# Patient Record
Sex: Male | Born: 1950
Health system: Southern US, Community
[De-identification: ages and names within clinical notes are randomized; demographics above are authoritative.]

## PROBLEM LIST (undated history)

## (undated) DIAGNOSIS — M109 Gout, unspecified: Secondary | ICD-10-CM

## (undated) DIAGNOSIS — I10 Essential (primary) hypertension: Secondary | ICD-10-CM

## (undated) DIAGNOSIS — Z8546 Personal history of malignant neoplasm of prostate: Secondary | ICD-10-CM

## (undated) DIAGNOSIS — R350 Frequency of micturition: Secondary | ICD-10-CM

## (undated) DIAGNOSIS — N433 Hydrocele, unspecified: Secondary | ICD-10-CM

## (undated) DIAGNOSIS — C801 Malignant (primary) neoplasm, unspecified: Secondary | ICD-10-CM

## (undated) DIAGNOSIS — M75101 Unspecified rotator cuff tear or rupture of right shoulder, not specified as traumatic: Secondary | ICD-10-CM

## (undated) DIAGNOSIS — Z9189 Other specified personal risk factors, not elsewhere classified: Secondary | ICD-10-CM

## (undated) HISTORY — PX: POLYPECTOMY: SHX149

## (undated) HISTORY — PX: COLONOSCOPY: SHX174

## (undated) HISTORY — DX: Malignant (primary) neoplasm, unspecified: C80.1

## (undated) HISTORY — DX: Essential (primary) hypertension: I10

---

## 2000-02-26 ENCOUNTER — Emergency Department (HOSPITAL_COMMUNITY): Admission: EM | Admit: 2000-02-26 | Discharge: 2000-02-26 | Payer: Self-pay | Admitting: Emergency Medicine

## 2004-08-09 ENCOUNTER — Emergency Department (HOSPITAL_COMMUNITY): Admission: EM | Admit: 2004-08-09 | Discharge: 2004-08-09 | Payer: Self-pay | Admitting: Emergency Medicine

## 2004-08-31 ENCOUNTER — Ambulatory Visit: Admission: RE | Admit: 2004-08-31 | Discharge: 2004-11-30 | Payer: Self-pay | Admitting: Radiation Oncology

## 2005-01-10 ENCOUNTER — Ambulatory Visit: Admission: RE | Admit: 2005-01-10 | Discharge: 2005-01-10 | Payer: Self-pay | Admitting: Radiation Oncology

## 2006-03-15 ENCOUNTER — Emergency Department (HOSPITAL_COMMUNITY): Admission: EM | Admit: 2006-03-15 | Discharge: 2006-03-15 | Payer: Self-pay | Admitting: Emergency Medicine

## 2012-01-29 ENCOUNTER — Other Ambulatory Visit: Payer: Self-pay | Admitting: Family Medicine

## 2012-04-16 ENCOUNTER — Other Ambulatory Visit: Payer: Self-pay | Admitting: Family Medicine

## 2012-04-16 NOTE — Telephone Encounter (Signed)
Need paper chart please.  

## 2012-04-17 NOTE — Telephone Encounter (Signed)
Chart pulled [id number]

## 2012-04-20 ENCOUNTER — Telehealth: Payer: Self-pay

## 2012-04-20 ENCOUNTER — Other Ambulatory Visit: Payer: Self-pay | Admitting: Family Medicine

## 2012-04-20 NOTE — Telephone Encounter (Signed)
Pt states that he is currently out of his maxide and zyloprim, he called the pharmacy two days ago to have them send over a request but has not heard anything as of yet. Best# [phone] Pharamacy: [facility] on [address]

## 2012-04-20 NOTE — Telephone Encounter (Deleted)
Pt states that he is currently out of his rx's for

## 2012-04-21 NOTE — Telephone Encounter (Signed)
Must have office visit. Not seen in over one year.

## 2012-04-21 NOTE — Telephone Encounter (Signed)
Chart pulled [id number]

## 2012-04-21 NOTE — Telephone Encounter (Signed)
Need chart

## 2012-04-21 NOTE — Telephone Encounter (Signed)
Patient needs office visit for these meds, I have called him and advised.

## 2012-05-03 ENCOUNTER — Other Ambulatory Visit: Payer: Self-pay | Admitting: Physician Assistant

## 2012-05-07 ENCOUNTER — Ambulatory Visit (INDEPENDENT_AMBULATORY_CARE_PROVIDER_SITE_OTHER): Payer: BC Managed Care – PPO | Admitting: Family Medicine

## 2012-05-07 VITALS — BP 112/78 | HR 69 | Temp 98.1°F | Resp 16 | Ht 68.5 in | Wt 214.4 lb

## 2012-05-07 DIAGNOSIS — M109 Gout, unspecified: Secondary | ICD-10-CM

## 2012-05-07 DIAGNOSIS — I10 Essential (primary) hypertension: Secondary | ICD-10-CM

## 2012-05-07 MED ORDER — ALLOPURINOL 300 MG PO TABS: 300.0000 mg | ORAL_TABLET | Freq: Every day | ORAL | Status: DC

## 2012-05-07 MED ORDER — TRIAMTERENE-HCTZ 75-50 MG PO TABS: 1.0000 | ORAL_TABLET | Freq: Every day | ORAL | Status: DC

## 2012-05-07 MED ORDER — COLCHICINE 0.6 MG PO TABS
0.6000 mg | ORAL_TABLET | Freq: Every day | ORAL | Status: DC
Start: 2012-05-07 — End: 2013-04-22

## 2012-05-07 NOTE — Progress Notes (Signed)
[facility]:  Office Visit  Chief Complaint:  Chief Complaint  Patient presents with  . Medication Refill    HPI: Jerry Chapman is a 61 y.o. male who complains of:  1. HTN-taking meds, compliant. Some mild cramps in ankle but no other SEs. Has lost 15 lbs. Does not take BP at home.  2. Gout-he takes 2 gout meds colcrys and allopurinol. States he was put on this and guranteed that his gout will never reoccur. It hasn't. He is able to eat and drink whatever he wants.   Past Medical History  Diagnosis Date  . Hypertension   . Gout   . Elevated PSA     s/p seed radiation   Past Surgical History  Procedure Date  . Prostate biopsy      + adencarcinoma   History   Social History  . Marital Status: Divorced    Spouse Name: N/A    Number of Children: N/A  . Years of Education: N/A   Social History Main Topics  . Smoking status: Never Smoker   . Smokeless tobacco: None  . Alcohol Use: Yes  . Drug Use: No  . Sexually Active: None   Other Topics Concern  . None   Social History Narrative  . None   No family history on file. No Known Allergies Prior to Admission medications   Medication Sig Start Date End Date Taking? Authorizing Provider  allopurinol (ZYLOPRIM) 300 MG tablet TAKE ONE TABLET BY MOUTH EVERY DAY 04/20/12  Yes Ryan M Dunn, PA-C  colchicine (COLCRYS) 0.6 MG tablet Take 1 tablet (0.6 mg total) by mouth daily. Needs office visit (second notice) 05/03/12  Yes Heather M Marte, PA-C  triamterene-hydrochlorothiazide (MAXZIDE) 75-50 MG per tablet TAKE ONE TABLET BY MOUTH EVERY DAY 04/20/12  Yes Ryan M Dunn, PA-C     ROS: The patient denies fevers, chills, night sweats, unintentional weight loss, chest pain, palpitations, wheezing, dyspnea on exertion, nausea, vomiting, abdominal pain, dysuria, hematuria, melena, numbness, weakness, or tingling.   All other systems have been reviewed and were otherwise negative with the exception of those  mentioned in the HPI and as above.    PHYSICAL EXAM: Filed Vitals:   05/07/12 1946  BP: 112/78  Pulse: 69  Temp: 98.1 F (36.7 C)  Resp: 16   Filed Vitals:   05/07/12 1946  Height: 5' 8.5" (1.74 m)  Weight: 214 lb 6.4 oz (97.251 kg)   Body mass index is 32.13 kg/(m^2).  General: Alert, no acute distress, pleasant HEENT:  Normocephalic, atraumatic, oropharynx patent. EOMI, fundoscopic exam nl Cardiovascular:  Regular rate and rhythm, no rubs murmurs or gallops.  No Carotid bruits, radial pulse intact. No pedal edema.  Respiratory: Clear to auscultation bilaterally.  No wheezes, rales, or rhonchi.  No cyanosis, no use of accessory musculature GI: No organomegaly, abdomen is soft and non-tender, positive bowel sounds.  No masses. Skin: No rashes. Neurologic: Facial musculature symmetric. Psychiatric: Patient is appropriate throughout our interaction. Lymphatic: No cervical lymphadenopathy Musculoskeletal: Gait intact.   LABS: No results found for this or any previous visit.   EKG/XRAY:   Primary read interpreted by Dr. Conley Rolls at [facility]   ASSESSMENT/PLAN: Encounter Diagnoses  Name Primary?  . HTN (hypertension) Yes  . Gout    1. Refill meds 2. Check BMP 3. Asked patient to return for annual exam and fasting blood work in 1-3 months. H/o prostate adenocarcinoma s/p seeding radiation.     LE, THAO  PHUONG, DO 05/07/2012 8:23 PM

## 2012-05-08 ENCOUNTER — Encounter: Payer: Self-pay | Admitting: Family Medicine

## 2012-05-08 LAB — BASIC METABOLIC PANEL WITH GFR
Calcium: 9.7 mg/dL (ref 8.4–10.5)
Potassium: 4.5 meq/L (ref 3.5–5.3)
Sodium: 139 meq/L (ref 135–145)

## 2012-05-08 LAB — BASIC METABOLIC PANEL
BUN: 19 mg/dL (ref 6–23)
CO2: 23 mEq/L (ref 19–32)
Chloride: 104 mEq/L (ref 96–112)
Creat: 1.63 mg/dL — ABNORMAL HIGH (ref 0.50–1.35)
Glucose, Bld: 87 mg/dL (ref 70–99)

## 2012-05-27 ENCOUNTER — Other Ambulatory Visit: Payer: Self-pay | Admitting: Physician Assistant

## 2012-05-28 ENCOUNTER — Encounter: Payer: Self-pay | Admitting: Family Medicine

## 2012-09-05 ENCOUNTER — Other Ambulatory Visit: Payer: Self-pay | Admitting: Physician Assistant

## 2012-11-24 ENCOUNTER — Other Ambulatory Visit: Payer: Self-pay | Admitting: Family Medicine

## 2013-01-17 ENCOUNTER — Other Ambulatory Visit: Payer: Self-pay | Admitting: Physician Assistant

## 2013-02-27 ENCOUNTER — Other Ambulatory Visit: Payer: Self-pay | Admitting: Physician Assistant

## 2013-04-22 ENCOUNTER — Ambulatory Visit (INDEPENDENT_AMBULATORY_CARE_PROVIDER_SITE_OTHER): Payer: BC Managed Care – PPO | Admitting: Emergency Medicine

## 2013-04-22 VITALS — BP 140/90 | HR 55 | Temp 98.0°F | Resp 16 | Ht 69.0 in | Wt 237.0 lb

## 2013-04-22 DIAGNOSIS — M109 Gout, unspecified: Secondary | ICD-10-CM

## 2013-04-22 DIAGNOSIS — I10 Essential (primary) hypertension: Secondary | ICD-10-CM

## 2013-04-22 MED ORDER — ALLOPURINOL 300 MG PO TABS: 300.0000 mg | ORAL_TABLET | Freq: Every day | ORAL | Status: DC

## 2013-04-22 MED ORDER — COLCHICINE 0.6 MG PO TABS: 0.6000 mg | ORAL_TABLET | Freq: Every day | ORAL | Status: DC

## 2013-04-22 MED ORDER — TRIAMTERENE-HCTZ 75-50 MG PO TABS: 1.0000 | ORAL_TABLET | Freq: Every day | ORAL | Status: DC

## 2013-04-22 NOTE — Progress Notes (Signed)
[facility] [address] [place] [address] [phone]  Date:  04/22/2013   Name:  Jerry Chapman   DOB:  03/07/1951   MRN:  [id number]  PCP:  No primary provider on file.    Chief Complaint: Medication Refill   History of Present Illness:  Jerry Chapman is a 62 y.o. very pleasant male patient who presents with the following:  History of gout and hypertension.  No documented recent lab work.  No colonoscopy.  Says up to date on immunizations.  No improvement with over the counter medications or other home remedies. Denies other complaint or health concern today.    There are no active problems to display for this patient.   Past Medical History  Diagnosis Date  . Hypertension   . Gout   . Elevated PSA     s/p seed radiation    Past Surgical History  Procedure Laterality Date  . Prostate biopsy       + adencarcinoma    History  Substance Use Topics  . Smoking status: Never Smoker   . Smokeless tobacco: Not on file  . Alcohol Use: Yes    No family history on file.  No Known Allergies  Medication list has been reviewed and updated.  Current Outpatient Prescriptions on File Prior to Visit  Medication Sig Dispense Refill  . allopurinol (ZYLOPRIM) 300 MG tablet Take 1 tablet (300 mg total) by mouth daily.  90 tablet  1  . colchicine (COLCRYS) 0.6 MG tablet Take 1 tablet (0.6 mg total) by mouth daily.  30 tablet  5  . colchicine (COLCRYS) 0.6 MG tablet Take 1 tablet (0.6 mg total) by mouth daily. Need office visit for additional refills.  30 tablet  0  . triamterene-hydrochlorothiazide (MAXZIDE) 75-50 MG per tablet TAKE ONE TABLET BY MOUTH ONCE DAILY -NEEDS  OFFICE  VISIT  30 tablet  0   No current facility-administered medications on file prior to visit.    Review of Systems:  As per HPI, otherwise negative.    Physical Examination: Filed Vitals:   04/22/13 2015  BP: 140/90  Pulse: 55  Temp: 98 F (36.7 C)  Resp: 16    Filed Vitals:   04/22/13 2015  Height: 5\' 9"  (1.753 m)  Weight: 237 lb (107.502 kg)   Body mass index is 34.98 kg/(m^2). Ideal Body Weight: Weight in (lb) to have BMI = 25: 168.9  GEN: obese, NAD, Non-toxic, A & O x 3 HEENT: Atraumatic, Normocephalic. Neck supple. No masses, No LAD. Ears and Nose: No external deformity. CV: RRR, No M/G/R. No JVD. No thrill. No extra heart sounds. PULM: CTA B, no wheezes, crackles, rhonchi. No retractions. No resp. distress. No accessory muscle use. ABD: S, NT, ND, +BS. No rebound. No HSM. EXTR: No c/c/e NEURO Normal gait.  PSYCH: Normally interactive. Conversant. Not depressed or anxious appearing.  Calm demeanor.    Assessment and Plan: Hypertension Gout Overdue for labs and physical  Needs colonoscopy Wants to schedule with Dr Perrin Maltese   Signed,  Phillips Odor, MD

## 2013-04-28 ENCOUNTER — Telehealth: Payer: Self-pay

## 2013-04-28 DIAGNOSIS — M109 Gout, unspecified: Secondary | ICD-10-CM

## 2013-04-28 MED ORDER — COLCHICINE 0.6 MG PO TABS: 0.6000 mg | ORAL_TABLET | Freq: Every day | ORAL | Status: DC

## 2013-04-28 MED ORDER — TRIAMTERENE-HCTZ 75-50 MG PO TABS: 1.0000 | ORAL_TABLET | Freq: Every day | ORAL | Status: DC

## 2013-04-28 MED ORDER — ALLOPURINOL 300 MG PO TABS: 300.0000 mg | ORAL_TABLET | Freq: Every day | ORAL | Status: DC

## 2013-04-28 NOTE — Telephone Encounter (Signed)
Sent!

## 2013-04-28 NOTE — Telephone Encounter (Signed)
Pt would like the maxzide and allopurinol sent to the [facility] on [address]. The [facility] was too expensive. Call at [phone]

## 2013-05-09 ENCOUNTER — Ambulatory Visit: Payer: BC Managed Care – PPO

## 2013-05-09 ENCOUNTER — Ambulatory Visit (INDEPENDENT_AMBULATORY_CARE_PROVIDER_SITE_OTHER): Payer: BC Managed Care – PPO | Admitting: Internal Medicine

## 2013-05-09 VITALS — BP 124/80 | HR 63 | Temp 98.0°F | Resp 16 | Ht 69.0 in | Wt 230.0 lb

## 2013-05-09 DIAGNOSIS — M545 Low back pain: Secondary | ICD-10-CM

## 2013-05-09 DIAGNOSIS — M109 Gout, unspecified: Secondary | ICD-10-CM

## 2013-05-09 DIAGNOSIS — B360 Pityriasis versicolor: Secondary | ICD-10-CM

## 2013-05-09 DIAGNOSIS — N390 Urinary tract infection, site not specified: Secondary | ICD-10-CM

## 2013-05-09 DIAGNOSIS — Z8546 Personal history of malignant neoplasm of prostate: Secondary | ICD-10-CM

## 2013-05-09 DIAGNOSIS — K0889 Other specified disorders of teeth and supporting structures: Secondary | ICD-10-CM

## 2013-05-09 DIAGNOSIS — K089 Disorder of teeth and supporting structures, unspecified: Secondary | ICD-10-CM

## 2013-05-09 DIAGNOSIS — I10 Essential (primary) hypertension: Secondary | ICD-10-CM

## 2013-05-09 DIAGNOSIS — N289 Disorder of kidney and ureter, unspecified: Secondary | ICD-10-CM

## 2013-05-09 LAB — POCT UA - MICROSCOPIC ONLY
Crystals, Ur, HPF, POC: NEGATIVE
Mucus, UA: POSITIVE

## 2013-05-09 LAB — POCT URINALYSIS DIPSTICK
Bilirubin, UA: NEGATIVE
Blood, UA: NEGATIVE
Nitrite, UA: NEGATIVE
Protein, UA: NEGATIVE
pH, UA: 6.5

## 2013-05-09 MED ORDER — HYDROCODONE-ACETAMINOPHEN 5-325 MG PO TABS: 1.0000 | ORAL_TABLET | Freq: Four times a day (QID) | ORAL | Status: DC | PRN

## 2013-05-09 MED ORDER — KETOCONAZOLE 200 MG PO TABS: 200.0000 mg | ORAL_TABLET | Freq: Every day | ORAL | Status: DC

## 2013-05-09 MED ORDER — AMOXICILLIN 500 MG PO CAPS: 1000.0000 mg | ORAL_CAPSULE | Freq: Two times a day (BID) | ORAL | Status: DC

## 2013-05-09 MED ORDER — METHOCARBAMOL 750 MG PO TABS: 750.0000 mg | ORAL_TABLET | Freq: Four times a day (QID) | ORAL | Status: DC

## 2013-05-09 NOTE — Progress Notes (Signed)
  Subjective:    Patient ID: Jerry Chapman, male    DOB: 11-26-1950, 62 y.o.   MRN: [id number]  HPI Pt presents with back pain, lower. He has not had any specific injury. The pain started on Monday. He is a contractor/construction. The pain is starting to radiate down his right leg. Pain doesn't keep him awake at night. The pain is on the sides of his back.  He also has tooth pain, ?abscess Also notes rash on shoulder--fungus Hx of Prostate CA  Review of Systems     Objective:   Physical Exam        Assessment & Plan:

## 2013-05-09 NOTE — Progress Notes (Signed)
  Subjective:    Patient ID: Jerry Chapman, male    DOB: [birth year], 62 y.o.   MRN: [id number]  HPI LBP for 1-2 weeks after bending over, minimal radiation, no numbess, weakness or incontinence. Bad tooth upper right molar Rash recurrent back and shoulders.Dx tinea and treated orally and topically   Review of Systems Prostate cancer Gout    Objective:   Physical Exam  Vitals reviewed. Constitutional: He is oriented to person, place, and time. He appears well-developed and well-nourished. No distress.  HENT:  Mouth/Throat: Uvula is midline, oropharynx is clear and moist and mucous membranes are normal. Abnormal dentition. Dental abscesses and dental caries present.    Eyes: EOM are normal.  Cardiovascular: Normal rate.   Pulmonary/Chest: Effort normal.  Musculoskeletal: He exhibits tenderness.  Neurological: He is alert and oriented to person, place, and time. No cranial nerve deficit. He exhibits normal muscle tone. Coordination normal.  Skin: Rash noted. Rash is macular. Rash is not pustular and not vesicular. There is erythema.     Psychiatric: He has a normal mood and affect.   Results for orders placed in visit on 05/09/13  POCT URINALYSIS DIPSTICK      Result Value Range   Color, UA yellow     Clarity, UA clear     Glucose, UA neg     Bilirubin, UA neg     Ketones, UA neg     Spec Grav, UA 1.020     Blood, UA neg     pH, UA 6.5     Protein, UA neg     Urobilinogen, UA 0.2     Nitrite, UA neg     Leukocytes, UA small (1+)    POCT UA - MICROSCOPIC ONLY      Result Value Range   WBC, Ur, HPF, POC 4-5     RBC, urine, microscopic 2-3     Bacteria, U Microscopic 1+     Mucus, UA pos     Epithelial cells, urine per micros 1-6     Crystals, Ur, HPF, POC neg     Casts, Ur, LPF, POC hyaline     Yeast, UA neg     CS urine  [facility] reading (PRIMARY) by  Dr Lache Dagher LS spine L5-S1 mild DDD/spondylosis       Assessment & Plan:  Abscess tooth LBP/elevated  creatinine/Gout Tinea corporus back and trunk/Ketoconazole for 7days 200mg  Amoxil/See dentist Back manual/Robaxin/Possible UTI

## 2013-05-09 NOTE — Patient Instructions (Addendum)
Body Ringworm Ringworm (tinea corporis) is a fungal infection of the skin on the body. This infection is not caused by worms, but is actually caused by a fungus. Fungus normally lives on the top of your skin and can be useful. However, in the case of ringworms, the fungus grows out of control and causes a skin infection. It can involve any area of skin on the body and can spread easily from one person to another (contagious). Ringworm is a common problem for children, but it can affect adults as well. Ringworm is also often found in athletes, especially wrestlers who share equipment and mats.  CAUSES  Ringworm of the body is caused by a fungus called dermatophyte. It can spread by:  Touchingother people who are infected.  Touchinginfected pets.  Touching or sharingobjects that have been in contact with the infected person or pet (hats, combs, towels, clothing, sports equipment). SYMPTOMS   Itchy, raised red spots and bumps on the skin.  Ring-shaped rash.  Redness near the border of the rash with a clear center.  Dry and scaly skin on or around the rash. Not every person develops a ring-shaped rash. Some develop only the red, scaly patches. DIAGNOSIS  Most often, ringworm can be diagnosed by performing a skin exam. Your caregiver may choose to take a skin scraping from the affected area. The sample will be examined under the microscope to see if the fungus is present.  TREATMENT  Body ringworm may be treated with a topical antifungal cream or ointment. Sometimes, an antifungal shampoo that can be used on your body is prescribed. You may be prescribed antifungal medicines to take by mouth if your ringworm is severe, keeps coming back, or lasts a long time.  HOME CARE INSTRUCTIONS   Only take over-the-counter or prescription medicines as directed by your caregiver.  Wash the infected area and dry it completely before applying yourcream or ointment.  When using antifungal shampoo to  treat the ringworm, leave the shampoo on the body for 3 5 minutes before rinsing.   Wear loose clothing to stop clothes from rubbing and irritating the rash.  Wash or change your bed sheets every night while you have the rash.  Have your pet treated by your veterinarian if it has the same infection. To prevent ringworm:   Practice good hygiene.  Wear sandals or shoes in public places and showers.  Do not share personal items with others.  Avoid touching red patches of skin on other people.  Avoid touching pets that have bald spots or wash your hands after doing so. SEEK MEDICAL CARE IF:   Your rash continues to spread after 7 days of treatment.  Your rash is not gone in 4 weeks.  The area around your rash becomes red, warm, tender, and swollen. Document Released: 08/09/2000 Document Revised: 05/06/2012 Document Reviewed: 02/24/2012 [facility] Patient Information 2014 [place], [place]. Tinea Versicolor Tinea versicolor is a common yeast infection of the skin. This condition becomes known when the yeast on our skin starts to overgrow (yeast is a normal inhabitant on our skin). This condition is noticed as white or light brown patches on brown skin, and is more evident in the summer on tanned skin. These areas are slightly scaly if scratched. The light patches from the yeast become evident when the yeast creates "holes in your suntan". This is most often noticed in the summer. The patches are usually located on the chest, back, pubis, neck and body folds. However, it may  occur on any area of body. Mild itching and inflammation (redness or soreness) may be present. DIAGNOSIS  The diagnosisof this is made clinically (by looking). Cultures from samples are usually not needed. Examination under the microscope may help. However, yeast is normally found on skin. The diagnosis still remains clinical. Examination under Wood's Ultraviolet Light can determine the extent of the infection. TREATMENT   This common infection is usually only of cosmetic (only a concern to your appearance). It is easily treated with dandruff shampoo used during showers or bathing. Vigorous scrubbing will eliminate the yeast over several days time. The light areas in your skin may remain for weeks or months after the infection is cured unless your skin is exposed to sunlight. The lighter or darker spots caused by the fungus that remain after complete treatment are not a sign of treatment failure; it will take a long time to resolve. Your caregiver may recommend a number of commercial preparations or medication by mouth if home care is not working. Recurrence is common and preventative medication may be necessary. This skin condition is not highly contagious. Special care is not needed to protect close friends and family members. Normal hygiene is usually enough. Follow up is required only if you develop complications (such as a secondary infection from scratching), if recommended by your caregiver, or if no relief is obtained from the preparations used. Document Released: 08/09/2000 Document Revised: 11/04/2011 Document Reviewed: 09/21/2008 [facility] Patient Information 2014 [place], [place].

## 2013-05-10 LAB — COMPREHENSIVE METABOLIC PANEL
Alkaline Phosphatase: 52 U/L (ref 39–117)
BUN: 23 mg/dL (ref 6–23)
Glucose, Bld: 98 mg/dL (ref 70–99)
Sodium: 136 mEq/L (ref 135–145)
Total Bilirubin: 0.5 mg/dL (ref 0.3–1.2)

## 2013-05-11 LAB — TSH: TSH: 1.595 u[IU]/mL (ref 0.350–4.500)

## 2013-05-12 LAB — URINE CULTURE: Colony Count: >=100000

## 2013-05-16 ENCOUNTER — Ambulatory Visit (INDEPENDENT_AMBULATORY_CARE_PROVIDER_SITE_OTHER): Payer: BC Managed Care – PPO | Admitting: Internal Medicine

## 2013-05-16 VITALS — BP 110/70 | HR 63 | Temp 98.6°F | Resp 16 | Ht 69.0 in | Wt 226.0 lb

## 2013-05-16 DIAGNOSIS — M545 Low back pain: Secondary | ICD-10-CM

## 2013-05-16 DIAGNOSIS — R7989 Other specified abnormal findings of blood chemistry: Secondary | ICD-10-CM

## 2013-05-16 DIAGNOSIS — C61 Malignant neoplasm of prostate: Secondary | ICD-10-CM

## 2013-05-16 DIAGNOSIS — E790 Hyperuricemia without signs of inflammatory arthritis and tophaceous disease: Secondary | ICD-10-CM

## 2013-05-16 DIAGNOSIS — K921 Melena: Secondary | ICD-10-CM

## 2013-05-16 DIAGNOSIS — N390 Urinary tract infection, site not specified: Secondary | ICD-10-CM

## 2013-05-16 DIAGNOSIS — M109 Gout, unspecified: Secondary | ICD-10-CM

## 2013-05-16 LAB — POCT URINALYSIS DIPSTICK
Ketones, UA: NEGATIVE
Leukocytes, UA: NEGATIVE
Nitrite, UA: NEGATIVE
Protein, UA: NEGATIVE
Urobilinogen, UA: 0.2
pH, UA: 5.5

## 2013-05-16 LAB — POCT UA - MICROSCOPIC ONLY
Casts, Ur, LPF, POC: NEGATIVE
Yeast, UA: NEGATIVE

## 2013-05-16 MED ORDER — CEFTRIAXONE SODIUM 1 G IJ SOLR
1.0000 g | Freq: Once | INTRAMUSCULAR | Status: AC
  Administered 2013-05-16: 1 g via INTRAMUSCULAR

## 2013-05-16 NOTE — Progress Notes (Signed)
Subjective:    Patient ID: Jerry Chapman, male    DOB: April 06, 1951, 62 y.o.   MRN: [id number]  HPI Urine culture positive for kidney stone, uric acid very high at 12.6, back pain , elevated creatinine, All this suggests uric acid nephropayhy with in infection.   Review of Systems     Objective:   Physical Exam  Results for orders placed in visit on 05/09/13  URINE CULTURE      Result Value Range   Culture PROTEUS MIRABILIS     Colony Count >=100,000 COLONIES/ML     Organism ID, Bacteria PROTEUS MIRABILIS    PSA      Result Value Range   PSA 0.79  <=4.00 ng/mL  COMPREHENSIVE METABOLIC PANEL      Result Value Range   Sodium 136  135 - 145 mEq/L   Potassium 4.7  3.5 - 5.3 mEq/L   Chloride 98  96 - 112 mEq/L   CO2 29  19 - 32 mEq/L   Glucose, Bld 98  70 - 99 mg/dL   BUN 23  6 - 23 mg/dL   Creat 4.09 (*) 8.11 - 1.35 mg/dL   Total Bilirubin 0.5  0.3 - 1.2 mg/dL   Alkaline Phosphatase 52  39 - 117 U/L   AST 40 (*) 0 - 37 U/L   ALT 36  0 - 53 U/L   Total Protein 8.1  6.0 - 8.3 g/dL   Albumin 4.5  3.5 - 5.2 g/dL   Calcium 91.4  8.4 - 78.2 mg/dL  URIC ACID      Result Value Range   Uric Acid, Serum 12.6 (*) 4.0 - 7.8 mg/dL  TSH      Result Value Range   TSH 1.595  0.350 - 4.500 uIU/mL  POCT URINALYSIS DIPSTICK      Result Value Range   Color, UA yellow     Clarity, UA clear     Glucose, UA neg     Bilirubin, UA neg     Ketones, UA neg     Spec Grav, UA 1.020     Blood, UA neg     pH, UA 6.5     Protein, UA neg     Urobilinogen, UA 0.2     Nitrite, UA neg     Leukocytes, UA small (1+)    POCT UA - MICROSCOPIC ONLY      Result Value Range   WBC, Ur, HPF, POC 4-5     RBC, urine, microscopic 2-3     Bacteria, U Microscopic 1+     Mucus, UA pos     Epithelial cells, urine per micros 1-6     Crystals, Ur, HPF, POC neg     Casts, Ur, LPF, POC hyaline     Yeast, UA neg     Results for orders placed in visit on 05/16/13  POCT URINALYSIS DIPSTICK      Result  Value Range   Color, UA yellow     Clarity, UA clear     Glucose, UA neg     Bilirubin, UA neg     Ketones, UA neg     Spec Grav, UA 1.020     Blood, UA neg     pH, UA 5.5     Protein, UA neg     Urobilinogen, UA 0.2     Nitrite, UA neg     Leukocytes, UA Negative    POCT UA - MICROSCOPIC ONLY  Result Value Range   WBC, Ur, HPF, POC 0-1     RBC, urine, microscopic neg     Bacteria, U Microscopic neg     Mucus, UA trace     Epithelial cells, urine per micros 0-1     Crystals, Ur, HPF, POC neg     Casts, Ur, LPF, POC neg     Yeast, UA neg    IFOBT (OCCULT BLOOD)      Result Value Range   IFOBT Positive           Assessment & Plan:  UTI/Elevated uric acid Refer to urologyRefer to colonoscopy blood in stool Rocephin 1 g Back exercises

## 2013-05-16 NOTE — Patient Instructions (Addendum)
Uric Acid Nephropathy Uric acid nephropathy is a condition of kidney damage that occurs when there is too much uric acid in the body. When the uric acid is too high, crystals can form in the tissues of the body. When this happens in the kidneys, it is called gouty or uric acid nephropathy.  Uric acid comes from the breakdown of purines in your diet. If too much uric acid is made, or your body does not efficiently clear uric acid from your body, then this can lead to too much uric acid in your blood. The whole system can also get very overworked. In those cases, kidney damage occurs; but the disease does not always cause high uric acid levels in the blood. The disease does not always cause high uric acid levels in the blood. CAUSES  Most cases of uric acid nephropathy happen during the treatment of cancer. This is because of the increase in uric acid production during chemotherapy.  Uric Acid Nephropathy happens frequently in people with gout. Some more common causes of high uric acid in the body include:  Overproduction of urate  Breakdown of blood.  Lymphomas and leukemias.  Polycythemia vera.  Psoriasis (severe).  Paget's disease.  Muscle breakdown.  Exercise.  Alcohol.  Obesity.  Purine-rich diet.  Decreased excretion of uric acid  Primary idiopathic hyperuricemia.  Kidney disease.  Diabetes insipidus.  High blood pressure.  Acidosis.  Down syndrome.  Starvation.  Sarcoidosis.  Lead intake.  Hyperparathyroidism.  Hypothyroidism.  Toxemia of Pregnancy.  Drug ingestion.  Aspirin (less than 2 g per day).  Water pills (diuretics).  Alcohol.  Sinemet.  Nicotinic acid.  Combined mechanism.  Glucose-6-phosphate dehydrogenase deficiency.  Alcohol. SYMPTOMS When uric acid nephropathy leads to sudden kidney failure, symptoms can include:  Nausea and vomiting.  Lethargy and seizures.  Complete kidney failure with the inability to pass your  water or urinating less. If stones form in the kidneys or ureters, some of the problems may include:  Flank pain.  Blood in the urine.  Urinary frequency.  Uncomfortable or painful urination. DIAGNOSIS  The diagnosis is based on finding uric acid crystals in joints, tissues or body fluids.  Blood work is done to check on the uric acid levels but is not always abnormal. Kidney function tests and other blood work is also done.  A urinalysis is usually performed and will show if uric acid crystals are found in the urine. The urine sample is usually normal. TREATMENT  The treatment is aimed at stopping the immediate problems and preventing them from coming back. It is also for the prevention of complications which come from longstanding gout or high uric acid in the tissues.  This is often done with medications.  If the start of kidney failure has been sudden and more severe, dialysis may be used.  Your caregiver may have you take a special diet which is low in purines. Just cutting out animal meats is a great help to cut down on your purine intake.  Keep up a good fluid intake. You should drink enough water to put out 2 to 3 quarts or liters of urine per day. PREVENTION  Prevention is done by using medications which decrease uric acid production or with medications to increase the rate of removal by the kidneys.  Obesity, high purine diets, alcohol, certain medications and too much exercise can elevate the uric acid. SEEK IMMEDIATE MEDICAL CARE IF:  You have been put on allopurinol and develop a rash. This  is a medical emergency.  You develop flank pain, urinate less or stop urinating.  Your urine becomes smoky or bloody colored.  You develop nausea or vomiting.  You develop lethargy or seizures. Document Released: 06/09/2007 Document Revised: 11/04/2011 Document Reviewed: 06/09/2007 [facility] Patient Information 2014 [place], [place]. Chronic Kidney Disease Chronic kidney  disease occurs when the kidneys are damaged over a long period. The kidneys are two organs that lie on either side of the spine between the middle of the back and the front of the abdomen. The kidneys:   Remove wastes and extra water from the blood.   Produce important hormones. These help keep bones strong, regulate blood pressure, and help create red blood cells.   Balance the fluids and chemicals in the blood and tissues. A small amount of kidney damage may not cause problems, but a large amount of damage may make it difficult or impossible for the kidneys to work the way they should. If steps are not taken to slow down the kidney damage or stop it from getting worse, the kidneys may stop working permanently. Most of the time, chronic kidney disease does not go away. However, it can often be controlled, and those with the disease can usually live normal lives. CAUSES  The most common causes of chronic kidney disease are diabetes and high blood pressure (hypertension). Chronic kidney disease may also be caused by:   Diseases that cause kidneys' filters to become inflamed.   Diseases that affect the immune system.   Genetic diseases.   Medicines that damage the kidneys, such as anti-inflammatory medicines.  Poisoning or exposure to toxic substances.   A reoccurring kidney or urinary infection.   A problem with urine flow. This may be caused by:   Cancer.   Kidney stones.   An enlarged prostate in males. SYMPTOMS  Because the kidney damage in chronic kidney disease occurs slowly, symptoms develop slowly and may not be obvious until the kidney damage becomes severe. A person may have a kidney disease for years without showing any symptoms. Symptoms can include:   Swelling (edema) of the legs, ankles, or feet.   Tiredness (lethargy).   Nausea or vomiting.   Confusion.   Problems with urination, such as:   Decreased urine production.   Frequent urination,  especially at night.   Frequent accidents in children who are potty trained.   Muscle twitches and cramps.   Shortness of breath.  Weakness.   Persistent itchiness.   Loss of appetite.  Metallic taste in the mouth.  Trouble sleeping.  Slowed development in children.  Short stature in children. DIAGNOSIS  Chronic kidney disease may be detected and diagnosed by tests, including blood, urine, imaging, or kidney biopsy tests.  TREATMENT  Most chronic kidney diseases cannot be cured. Treatment usually involves relieving symptoms and preventing or slowing the progression of the disease. Treatment may include:   A special diet. You may need to avoid alcohol and foods thatare salty and high in potassium.   Medicines. These may:   Lower blood pressure.   Relieve anemia.   Relieve swelling.   Protect the bones. HOME CARE INSTRUCTIONS   Follow your prescribed diet.   Only take over-the-counter or prescription medicines as directed by your caregiver.  Do not take any new medicines (prescription, over-the-counter, or nutritional supplements) unless approved by your caregiver. Many medicines can worsen your kidney damage or need to have the dose adjusted.   Quit smoking if you are a smoker.  Talk to your caregiver about a smoking cessation program.   Keep all follow-up appointments as directed by your caregiver. SEEK IMMEDIATE MEDICAL CARE IF:  Your symptoms get worse or you develop new symptoms.   You develop symptoms of end-stage kidney disease. These include:   Headaches.   Abnormally dark or light skin.   Numbness in the hands or feet.   Easy bruising.   Frequent hiccups.   Menstruation stops.   You have a fever.   You have decreased urine production.   You havepain or bleeding when urinating. MAKE SURE YOU:  Understand these instructions.  Will watch your condition.  Will get help right away if you are not doing well or get  worse. FOR MORE INFORMATION  American Association of Kidney Patients: ResidentialShow.is National Kidney Foundation: www.kidney.org American Kidney Fund: FightingMatch.com.ee [facility]: www.lifeoptions.org and www.kidneyschool.org Document Released: 05/21/2008 Document Revised: 07/29/2012 Document Reviewed: 04/10/2012 [facility] Patient Information 2014 [place], [place].

## 2013-05-16 NOTE — Progress Notes (Signed)
  Subjective:    Patient ID: Jerry Chapman, male    DOB: [birth year], 62 y.o.   MRN: [id number]  HPI Pt presents to clinic today for a 1 week follow up on LBP. Hydrocodone provides relief but pt stated he "tries not to take it". Only takes 1/2 tablet once daily for fear he may become dependent on it. No fever, night sweats.    Review of Systems     Objective:   Physical Exam        Assessment & Plan:

## 2013-05-28 ENCOUNTER — Other Ambulatory Visit: Payer: Self-pay | Admitting: Urology

## 2013-06-04 ENCOUNTER — Encounter: Payer: Self-pay | Admitting: Internal Medicine

## 2013-06-22 ENCOUNTER — Encounter (HOSPITAL_BASED_OUTPATIENT_CLINIC_OR_DEPARTMENT_OTHER): Payer: Self-pay | Admitting: *Deleted

## 2013-06-22 NOTE — Progress Notes (Addendum)
NPO AFTER MN. ARRIVE AT 0600. NEEDS ISTAT AND EKG.  SPOKE W/ PT ABOUT CHANGE OF DATE AND TIME OF PROCEDURE. PT TO ARRIVE AT 0645 ON 07-05-2013.  SAMES INSTRUCTIONS AS ABOVE.

## 2013-06-22 NOTE — Progress Notes (Signed)
06/22/13 1309  OBSTRUCTIVE SLEEP APNEA  Have you ever been diagnosed with sleep apnea through a sleep study? No  Do you snore loudly (loud enough to be heard through closed doors)?  1  Do you often feel tired, fatigued, or sleepy during the daytime? 0  Has anyone observed you stop breathing during your sleep? 0  Do you have, or are you being treated for high blood pressure? 1  BMI more than 35 kg/m2? 0  Age over 63 years old? 1  Neck circumference greater than 40 cm/18 inches? 0  Gender: 1  Obstructive Sleep Apnea Score 4  Score 4 or greater  Results sent to PCP

## 2013-06-30 ENCOUNTER — Encounter: Payer: Self-pay | Admitting: Internal Medicine

## 2013-06-30 ENCOUNTER — Ambulatory Visit (AMBULATORY_SURGERY_CENTER): Payer: BC Managed Care – PPO

## 2013-06-30 VITALS — Ht 69.0 in | Wt 216.0 lb

## 2013-06-30 DIAGNOSIS — Z1211 Encounter for screening for malignant neoplasm of colon: Secondary | ICD-10-CM

## 2013-06-30 MED ORDER — MOVIPREP 100 G PO SOLR: 1.0000 | Freq: Once | ORAL | Status: DC

## 2013-07-04 NOTE — H&P (Signed)
Jerry Chapman a 62 year old male with a history of prostate cancer treated with radiation.   History of Present Illness           Adenocarcinoma of the prostate: He was found to have a PSA of 10.5 in 12/05.  DRE was found to be negative.   TRUS/BX 08/09/04: Prostate volume measured 34 cc. Pathology: Gleason 3+3 = 6 in 5% of the specimen on the right side and 3+4 = 7 in 10% of the specimen from the left side. Treatment: IMRT completed in 4/06.    Interval history: His PSA fell to 0.44 in 3/08.  I recommended follow-up in 6 months however he failed to return.   He was found recently to have a PSA in 9/14 of 0.79.  A urine culture was positive for Proteus at that time in his serum uric acid level was elevated to 12.6 (4-7 0.8) with a creatinine of 1.62.  He was diagnosed with uric acid nephropathy. He denies any change in his voiding pattern. He denies having increasing hematuria. His back pain is positional. It seems to be worsened by bending over. He mentioned that he has been unable to retract his foreskin for at least 2 years now. He inquired about a circumcision.    Past Medical History Problems  1. History of  Acute Urate Nephropathy 274.10 2. History of  Arthritis V13.4 3. History of  Gout 274.9 4. History of  Hypertension 401.9 5. History of  Prostate Cancer V10.46 6. History of  Radiation Therapy V15.3  Surgical History Problems  1. History of  Biopsy Of The Prostate Needle  Current Meds 1. Allopurinol TABS; Therapy: (Recorded:20Mar2008) to 2. Colchicine TABS; Therapy: (Recorded:20Mar2008) to 3. Verapamil HCl TABS; Therapy: (Recorded:20Mar2008) to  Allergies Medication  1. No Known Drug Allergies  Family History Problems  1. Maternal history of  Cancer 2. Family history of  Family Health Status Number Of Children 3. Maternal history of  Gout V18.19 4. Fraternal history of  Gout V18.19  Social History Problems    Alcohol Use   Caffeine Use   Family history  of  Death In The Family Father   Family history of  Death In The Family Mother   Marital History - Divorced   Occupation: Denied    Tobacco Use  Review of Systems Genitourinary, constitutional, skin, eye, otolaryngeal, hematologic/lymphatic, cardiovascular, pulmonary, endocrine, musculoskeletal, gastrointestinal, neurological and psychiatric system(s) were reviewed and pertinent findings if present are noted.  Genitourinary: urinary frequency, nocturia and erectile dysfunction.  Musculoskeletal: back pain.    Vitals Vital Signs  Blood Pressure: 133 / 77 Heart Rate: 73  BMI Calculated: 32.73 BSA Calculated: 2.15 Height: 5 ft 9 in Weight: 221 lb   Physical Exam Constitutional: Well nourished and well developed . No acute distress.  ENT:. The ears and nose are normal in appearance.  Neck: The appearance of the neck is normal and no neck mass is present.  Pulmonary: No respiratory distress and normal respiratory rhythm and effort.  Cardiovascular: Heart rate and rhythm are normal . No peripheral edema.  Abdomen: The abdomen is soft and nontender. No masses are palpated. No CVA tenderness. No hernias are palpable. No hepatosplenomegaly noted.  Rectal: Rectal exam demonstrates normal sphincter tone, no tenderness and no masses. The prostate has no nodularity and is not tender. The left seminal vesicle is nonpalpable. The right seminal vesicle is nonpalpable. The perineum is normal on inspection.  Genitourinary: Examination of the penis demonstrates phimosis, but no discharge,  no masses, no lesions and a normal meatus. The penis is uncircumcised. The scrotum is without lesions. The right epididymis is palpably normal and non-tender. The left epididymis is palpably normal and non-tender. The right testis is non-tender and without masses. The left testis is non-tender and without masses.  Lymphatics: The femoral and inguinal nodes are not enlarged or tender.  Skin: Normal skin turgor, no  visible rash and no visible skin lesions.  Neuro/Psych:. Mood and affect are appropriate.    Results/Data Urine [Data Includes: Last 1 Day]   02Oct2014  COLOR YELLOW   APPEARANCE CLEAR   SPECIFIC GRAVITY 1.015   pH 6.0   GLUCOSE NEG mg/dL  BILIRUBIN NEG   KETONE NEG mg/dL  BLOOD NEG   PROTEIN NEG mg/dL  UROBILINOGEN 0.2 mg/dL  NITRITE NEG   LEUKOCYTE ESTERASE NEG    Old records or history reviewed: Notes from Dr. Ernestene Mention office as above.  Renal ultrasound: The right kidney measures 11.1 cm with 13 mm of parenchyma. Left kidney 10.8 cm with 15 mm of. Both kidneys had slight increase in cortical echogenicity consistent with medical renal disease. Bilateral cysts were seen but there was no evidence of hydronephrosis or solid mass. No stones were noted. There was no dilatation of the ureters and the bladder had 35 cc with some thickening of the bladder wall consistent with outlet obstruction.  The following clinical lab reports were reviewed:  UA: Clear.    Assessment Assessed        He had no abnormality on his renal ultrasound that would account for his elevated creatinine. I suspect he will likely need a nephrology referral at some point if his creatinine continues to worsen. His PSA remains normal indicating his prostate cancer remains under good control. It sounds as if his low back pain is musculoskeletal.  He has a low PVR and no abnormality of the upper tract that would place him at increased risk of infection. He's had one UTI. His urine appears clear at this time. He has a significant phimosis. We discussed the treatment options and he has elected to proceed with circumcision after I had discussed how the procedure was performed in detail, its probability of success, the risks and complications and anticipated postoperative course.   Plan     1. He will be scheduled for a circumcision. 2.  We will continue to have his PSA checked on a yearly basis

## 2013-07-05 ENCOUNTER — Encounter (HOSPITAL_BASED_OUTPATIENT_CLINIC_OR_DEPARTMENT_OTHER): Payer: Self-pay | Admitting: *Deleted

## 2013-07-05 ENCOUNTER — Ambulatory Visit (HOSPITAL_BASED_OUTPATIENT_CLINIC_OR_DEPARTMENT_OTHER)
Admission: RE | Admit: 2013-07-05 | Discharge: 2013-07-05 | Disposition: A | Discharge status: Home / Self Care | Payer: BC Managed Care – PPO | Source: Ambulatory Visit | Attending: Urology | Admitting: Urology

## 2013-07-05 ENCOUNTER — Ambulatory Visit (HOSPITAL_BASED_OUTPATIENT_CLINIC_OR_DEPARTMENT_OTHER): Payer: BC Managed Care – PPO | Admitting: Anesthesiology

## 2013-07-05 ENCOUNTER — Encounter (HOSPITAL_BASED_OUTPATIENT_CLINIC_OR_DEPARTMENT_OTHER)
Admission: RE | Disposition: A | Discharge status: Home / Self Care | Payer: Self-pay | Source: Ambulatory Visit | Attending: Urology

## 2013-07-05 ENCOUNTER — Encounter (HOSPITAL_BASED_OUTPATIENT_CLINIC_OR_DEPARTMENT_OTHER): Payer: BC Managed Care – PPO | Admitting: Anesthesiology

## 2013-07-05 DIAGNOSIS — N471 Phimosis: Secondary | ICD-10-CM | POA: Diagnosis present

## 2013-07-05 DIAGNOSIS — N433 Hydrocele, unspecified: Secondary | ICD-10-CM | POA: Insufficient documentation

## 2013-07-05 DIAGNOSIS — N480 Leukoplakia of penis: Secondary | ICD-10-CM | POA: Insufficient documentation

## 2013-07-05 DIAGNOSIS — L940 Localized scleroderma [morphea]: Secondary | ICD-10-CM | POA: Insufficient documentation

## 2013-07-05 DIAGNOSIS — I10 Essential (primary) hypertension: Secondary | ICD-10-CM | POA: Insufficient documentation

## 2013-07-05 DIAGNOSIS — N35919 Unspecified urethral stricture, male, unspecified site: Secondary | ICD-10-CM | POA: Insufficient documentation

## 2013-07-05 DIAGNOSIS — M109 Gout, unspecified: Secondary | ICD-10-CM | POA: Insufficient documentation

## 2013-07-05 DIAGNOSIS — N478 Other disorders of prepuce: Secondary | ICD-10-CM | POA: Insufficient documentation

## 2013-07-05 HISTORY — PX: CIRCUMCISION: SHX1350

## 2013-07-05 HISTORY — DX: Personal history of malignant neoplasm of prostate: Z85.46

## 2013-07-05 HISTORY — DX: Gout, unspecified: M10.9

## 2013-07-05 LAB — POCT I-STAT 4, (NA,K, GLUC, HGB,HCT)
Glucose, Bld: 115 mg/dL — ABNORMAL HIGH (ref 70–99)
HCT: 57 % — ABNORMAL HIGH (ref 39.0–52.0)
Hemoglobin: 19.4 g/dL — ABNORMAL HIGH (ref 13.0–17.0)
Potassium: 3.7 mEq/L (ref 3.5–5.1)
Sodium: 143 mEq/L (ref 135–145)

## 2013-07-05 SURGERY — CIRCUMCISION, ADULT
Anesthesia: General | Site: Penis | Wound class: Clean Contaminated

## 2013-07-05 MED ORDER — OXYCODONE-ACETAMINOPHEN 10-325 MG PO TABS: 1.0000 | ORAL_TABLET | ORAL | Status: DC | PRN

## 2013-07-05 MED ORDER — KETOROLAC TROMETHAMINE 30 MG/ML IJ SOLN
15.0000 mg | Freq: Once | INTRAMUSCULAR | Status: DC | PRN
  Filled 2013-07-05: qty 1

## 2013-07-05 MED ORDER — PROPOFOL 10 MG/ML IV BOLUS
INTRAVENOUS | Status: DC | PRN
  Administered 2013-07-05: 250 mg via INTRAVENOUS

## 2013-07-05 MED ORDER — FENTANYL CITRATE 0.05 MG/ML IJ SOLN
25.0000 ug | INTRAMUSCULAR | Status: DC | PRN
  Filled 2013-07-05: qty 1

## 2013-07-05 MED ORDER — BUPIVACAINE HCL 0.5 % IJ SOLN
INTRAMUSCULAR | Status: DC | PRN
  Administered 2013-07-05: 19 mL

## 2013-07-05 MED ORDER — PROMETHAZINE HCL 25 MG/ML IJ SOLN
6.2500 mg | INTRAMUSCULAR | Status: DC | PRN
  Filled 2013-07-05: qty 1

## 2013-07-05 MED ORDER — LIDOCAINE HCL (CARDIAC) 20 MG/ML IV SOLN
INTRAVENOUS | Status: DC | PRN
  Administered 2013-07-05: 80 mg via INTRAVENOUS

## 2013-07-05 MED ORDER — FENTANYL CITRATE 0.05 MG/ML IJ SOLN
INTRAMUSCULAR | Status: DC | PRN
  Administered 2013-07-05 (×2): 25 ug via INTRAVENOUS
  Administered 2013-07-05: 50 ug via INTRAVENOUS
  Administered 2013-07-05 (×4): 25 ug via INTRAVENOUS

## 2013-07-05 MED ORDER — KETOROLAC TROMETHAMINE 30 MG/ML IJ SOLN
INTRAMUSCULAR | Status: DC | PRN
  Administered 2013-07-05: 30 mg via INTRAVENOUS

## 2013-07-05 MED ORDER — LACTATED RINGERS IV SOLN
INTRAVENOUS | Status: DC
  Administered 2013-07-05 (×2): via INTRAVENOUS
  Filled 2013-07-05: qty 1000

## 2013-07-05 MED ORDER — MIDAZOLAM HCL 5 MG/5ML IJ SOLN
INTRAMUSCULAR | Status: DC | PRN
  Administered 2013-07-05 (×2): 1 mg via INTRAVENOUS

## 2013-07-05 MED ORDER — CEFAZOLIN SODIUM-DEXTROSE 2-3 GM-% IV SOLR
INTRAVENOUS | Status: DC | PRN
  Administered 2013-07-05: 2 g via INTRAVENOUS

## 2013-07-05 MED ORDER — BACITRACIN-NEOMYCIN-POLYMYXIN 400-5-5000 EX OINT
TOPICAL_OINTMENT | CUTANEOUS | Status: DC | PRN
  Administered 2013-07-05: 1 via TOPICAL

## 2013-07-05 MED ORDER — LACTATED RINGERS IV SOLN
INTRAVENOUS | Status: DC | PRN
  Administered 2013-07-05 (×2): via INTRAVENOUS

## 2013-07-05 MED ORDER — DEXAMETHASONE SODIUM PHOSPHATE 4 MG/ML IJ SOLN
INTRAMUSCULAR | Status: DC | PRN
  Administered 2013-07-05: 8 mg via INTRAVENOUS

## 2013-07-05 MED ORDER — ONDANSETRON HCL 4 MG/2ML IJ SOLN
INTRAMUSCULAR | Status: DC | PRN
  Administered 2013-07-05: 4 mg via INTRAVENOUS

## 2013-07-05 MED ORDER — CEFAZOLIN SODIUM-DEXTROSE 2-3 GM-% IV SOLR
2.0000 g | INTRAVENOUS | Status: DC
  Filled 2013-07-05: qty 50

## 2013-07-05 SURGICAL SUPPLY — 33 items
BANDAGE CO FLEX L/F 1IN X 5YD (GAUZE/BANDAGES/DRESSINGS) IMPLANT
BANDAGE CO FLEX L/F 2IN X 5YD (GAUZE/BANDAGES/DRESSINGS) IMPLANT
BANDAGE COBAN STERILE 2 (GAUZE/BANDAGES/DRESSINGS) ×2 IMPLANT
BLADE SURG 15 STRL LF DISP TIS (BLADE) ×1 IMPLANT
BLADE SURG 15 STRL SS (BLADE) ×1
BLADE SURG ROTATE 9660 (MISCELLANEOUS) IMPLANT
CLOTH BEACON ORANGE TIMEOUT ST (SAFETY) ×2 IMPLANT
COVER MAYO STAND STRL (DRAPES) ×2 IMPLANT
COVER TABLE BACK 60X90 (DRAPES) ×2 IMPLANT
DRAPE PED LAPAROTOMY (DRAPES) ×2 IMPLANT
ELECT REM PT RETURN 9FT ADLT (ELECTROSURGICAL) ×2
ELECTRODE REM PT RTRN 9FT ADLT (ELECTROSURGICAL) ×1 IMPLANT
GAUZE SPONGE 4X4 16PLY XRAY LF (GAUZE/BANDAGES/DRESSINGS) ×2 IMPLANT
GLOVE BIO SURGEON STRL SZ8 (GLOVE) ×2 IMPLANT
GLOVE BIOGEL PI IND STRL 7.5 (GLOVE) ×3 IMPLANT
GLOVE BIOGEL PI INDICATOR 7.5 (GLOVE) ×3
GOWN PREVENTION PLUS LG XLONG (DISPOSABLE) IMPLANT
GOWN STRL REIN XL XLG (GOWN DISPOSABLE) ×4 IMPLANT
IV NS IRRIG 3000ML ARTHROMATIC (IV SOLUTION) IMPLANT
NEEDLE HYPO 22GX1.5 SAFETY (NEEDLE) ×2 IMPLANT
NS IRRIG 500ML POUR BTL (IV SOLUTION) IMPLANT
PACK BASIN DAY SURGERY FS (CUSTOM PROCEDURE TRAY) ×2 IMPLANT
PENCIL BUTTON HOLSTER BLD 10FT (ELECTRODE) ×2 IMPLANT
SPONGE GAUZE 4X4 12PLY (GAUZE/BANDAGES/DRESSINGS) ×2 IMPLANT
SUT CHROMIC 3 0 SH 27 (SUTURE) ×4 IMPLANT
SUT CHROMIC 4 0 RB 1X27 (SUTURE) IMPLANT
SUT CHROMIC 4 0 SH 27 (SUTURE) ×2 IMPLANT
SYR CONTROL 10ML LL (SYRINGE) ×2 IMPLANT
SYRINGE 10CC LL (SYRINGE) ×2 IMPLANT
TOWEL OR 17X24 6PK STRL BLUE (TOWEL DISPOSABLE) ×4 IMPLANT
TRAY DSU PREP LF (CUSTOM PROCEDURE TRAY) ×2 IMPLANT
WATER STERILE IRR 3000ML UROMA (IV SOLUTION) IMPLANT
WATER STERILE IRR 500ML POUR (IV SOLUTION) ×2 IMPLANT

## 2013-07-05 NOTE — Interval H&P Note (Signed)
History and Physical Interval Note:  07/05/2013 8:02 AM  Jerry Chapman  has presented today for surgery, with the diagnosis of PHIMOSIS  The various methods of treatment have been discussed with the patient and family. After consideration of risks, benefits and other options for treatment, the patient has consented to  Procedure(s): CIRCUMCISION ADULT (N/A) as a surgical intervention .  The patient's history has been reviewed, patient examined, no change in status, stable for surgery.  I have reviewed the patient's chart and labs.  Questions were answered to the patient's satisfaction.     Garnett Farm

## 2013-07-05 NOTE — Anesthesia Preprocedure Evaluation (Signed)
Anesthesia Evaluation  Patient identified by MRN, date of birth, ID band Patient awake    Reviewed: Allergy & Precautions, H&P , NPO status , Patient's Chart, lab work & pertinent test results  Airway Mallampati: II TM Distance: >3 FB Neck ROM: Full    Dental no notable dental hx.    Pulmonary neg pulmonary ROS,  breath sounds clear to auscultation  Pulmonary exam normal       Cardiovascular hypertension, Pt. on medications Rhythm:Regular Rate:Normal     Neuro/Psych negative neurological ROS  negative psych ROS   GI/Hepatic negative GI ROS, Neg liver ROS,   Endo/Other  negative endocrine ROS  Renal/GU negative Renal ROS  negative genitourinary   Musculoskeletal negative musculoskeletal ROS (+)   Abdominal   Peds negative pediatric ROS (+)  Hematology negative hematology ROS (+)   Anesthesia Other Findings   Reproductive/Obstetrics negative OB ROS                           Anesthesia Physical Anesthesia Plan  ASA: II  Anesthesia Plan: General   Post-op Pain Management:    Induction: Intravenous  Airway Management Planned: LMA  Additional Equipment:   Intra-op Plan:   Post-operative Plan:   Informed Consent: I have reviewed the patients History and Physical, chart, labs and discussed the procedure including the risks, benefits and alternatives for the proposed anesthesia with the patient or authorized representative who has indicated his/her understanding and acceptance.   Dental advisory given  Plan Discussed with: CRNA and Surgeon  Anesthesia Plan Comments:         Anesthesia Quick Evaluation  

## 2013-07-05 NOTE — Progress Notes (Signed)
Dr. Vernie Ammons in to see, dressing loose on penis and coming off, instructed to reapply antibiotic ointment and gauze to penis before discharge.

## 2013-07-05 NOTE — Op Note (Signed)
PATIENT:  Jerry Chapman  PRE-OPERATIVE DIAGNOSIS: Severe phimosis  POST-OPERATIVE DIAGNOSIS: 1. Severe phimosis 2. Right hydrocele 3. Meatal stenosis  PROCEDURE: 1. Circumcision 2. Aspiration drainage of right hydrocele. 3. Meatal dilatation  SURGEON:  Garnett Farm  INDICATION: [name] is a 62 year old male who has progressively developed the inability to retract his foreskin. He was noted on examination to have a severe phimosis and the pigmentation on the inner aspect of his foreskin consistent with BXO. He elected to proceed with circumcision. I did note on examination that he had a generous scrotum that was partially enveloping the penile shaft.  ANESTHESIA:  General  EBL:  Minimal  DRAINS: None  LOCAL MEDICATIONS USED:  19 cc of half percent plain Marcaine  SPECIMEN:  Foreskin to pathology  Description of procedure: After informed consent the patient was brought to the major or, placed on the table and administered general anesthesia. His genitalia was sterilely prepped and draped an official timeout was then performed.  Examination revealed the foreskin could not be retracted. The penile shaft appeared to be enveloped by what I felt on exam was a very large right hydrocele. I felt he would have a better result with resolution of his hydrocele so that his shaft skin would not be pushed back over the glans during healing. I therefore elected to aspirate the right hydrocele.  Right hydrocele aspiration was performed by placing a 20 French Angiocath through the scrotal skin into the hydrocele fluid. I drained approximately 80 cc of clear amber fluid. I then removed the Angiocath and there was resolution of his hydrocele with less of his penile shaft skin being enveloped.  I made a dorsal slit through his foreskin and retracted the foreskin back. I then made a circumcising incision circumferentially around the glans approximately 4 mm from the glans. I then replaced  the foreskin in its normal anatomic position and marked the location of the corona and then incised along this Alexiana Laverdure. I then excised the foreskin and cauterized all bleeding points. The frenulum was then reapproximated with 3 interrupted  4-0 chromic sutures. I then placed a U stitch at the 6:00 position and a second stitch at the 12:00 position using 3-0 chromic. The skin edges were then reapproximated in running fashion.   At the end of the procedure and noted he did have a fairly white glans consistent with BXO. This involved the meatus and resulted in meatal stenosis. I therefore performed medial dilatation by passing a hemostat through the meatus and dilating the meatus to its normal size. Neosporin was applied to the incision and I then wrapped the penis loosely with gauze and secured this with Koban.  I then injected the local anesthetic at the base of the penis for a dorsal penile block in the standard fashion. The patient was then awakened and taken recovery room in stable and satisfactory condition. Tolerated procedure well with no operative complications. Needle, sponge and instrument counts were correct x2 at the end of the operation.     PLAN OF CARE: Discharge to home after PACU  PATIENT DISPOSITION:  PACU - hemodynamically stable.

## 2013-07-05 NOTE — Transfer of Care (Signed)
Immediate Anesthesia Transfer of Care Note  Patient: Jerry Chapman  Procedure(s) Performed: Procedure(s) (LRB): CIRCUMCISION ADULT, aspiration of right hydrocele, meatal dilitation (N/A)  Patient Location: PACU  Anesthesia Type: General  Level of Consciousness: awake, sedated, patient cooperative and responds to stimulation  Airway & Oxygen Therapy: Patient Spontanous Breathing and Patient connected to face mask oxygen  Post-op Assessment: Report given to PACU RN, Post -op Vital signs reviewed and stable and Patient moving all extremities  Post vital signs: Reviewed and stable  Complications: No apparent anesthesia complications

## 2013-07-05 NOTE — Anesthesia Postprocedure Evaluation (Signed)
  Anesthesia Post-op Note  Patient: Jerry Chapman  Procedure(s) Performed: Procedure(s) (LRB): CIRCUMCISION ADULT, aspiration of right hydrocele, meatal dilitation (N/A)  Patient Location: PACU  Anesthesia Type: General  Level of Consciousness: awake and alert   Airway and Oxygen Therapy: Patient Spontanous Breathing  Post-op Pain: mild  Post-op Assessment: Post-op Vital signs reviewed, Patient's Cardiovascular Status Stable, Respiratory Function Stable, Patent Airway and No signs of Nausea or vomiting  Last Vitals:  Filed Vitals:   07/05/13 1000  BP: 152/90  Pulse: 64  Temp:   Resp: 13    Post-op Vital Signs: stable   Complications: No apparent anesthesia complications

## 2013-07-05 NOTE — Anesthesia Procedure Notes (Signed)
Procedure Name: LMA Insertion Date/Time: 07/05/2013 8:11 AM Performed by: Jessica Priest Pre-anesthesia Checklist: Patient identified, Emergency Drugs available, Suction available and Patient being monitored Patient Re-evaluated:Patient Re-evaluated prior to inductionOxygen Delivery Method: Circle System Utilized Preoxygenation: Pre-oxygenation with 100% oxygen Intubation Type: IV induction Ventilation: Mask ventilation without difficulty LMA: LMA inserted LMA Size: 5.0 Number of attempts: 1 Airway Equipment and Method: bite block Placement Confirmation: positive ETCO2 Tube secured with: Tape Dental Injury: Teeth and Oropharynx as per pre-operative assessment

## 2013-07-06 ENCOUNTER — Encounter (HOSPITAL_BASED_OUTPATIENT_CLINIC_OR_DEPARTMENT_OTHER): Payer: Self-pay | Admitting: Urology

## 2013-07-14 ENCOUNTER — Telehealth: Payer: Self-pay | Admitting: Internal Medicine

## 2013-07-14 NOTE — Telephone Encounter (Signed)
No

## 2013-07-15 ENCOUNTER — Encounter: Payer: BC Managed Care – PPO | Admitting: Internal Medicine

## 2013-07-19 ENCOUNTER — Encounter: Payer: Self-pay | Admitting: Internal Medicine

## 2013-07-19 ENCOUNTER — Ambulatory Visit (AMBULATORY_SURGERY_CENTER): Payer: BC Managed Care – PPO | Admitting: Internal Medicine

## 2013-07-19 VITALS — BP 123/78 | HR 58 | Temp 97.2°F | Resp 12 | Ht 67.0 in | Wt 216.0 lb

## 2013-07-19 DIAGNOSIS — D126 Benign neoplasm of colon, unspecified: Secondary | ICD-10-CM

## 2013-07-19 DIAGNOSIS — Z1211 Encounter for screening for malignant neoplasm of colon: Secondary | ICD-10-CM

## 2013-07-19 MED ORDER — SODIUM CHLORIDE 0.9 % IV SOLN: 500.0000 mL | INTRAVENOUS | Status: DC

## 2013-07-19 NOTE — Op Note (Signed)
[facility] [address] [place], [address]   COLONOSCOPY PROCEDURE REPORT  PATIENT: Jerry, Chapman  MR#: [id number] BIRTHDATE: 1951-04-13 , 62  yrs. old GENDER: Male ENDOSCOPIST: Roxy Cedar, MD REFERRED XB:JYNWG Guest, M.D. PROCEDURE DATE:  07/19/2013 PROCEDURE:   Colonoscopy with snare polypectomy x 5 First Screening Colonoscopy - Avg.  risk and is 50 yrs.  old or older Yes.  Prior Negative Screening - Now for repeat screening. N/A  History of Adenoma - Now for follow-up colonoscopy & has been > or = to 3 yrs.  N/A  Polyps Removed Today? Yes. ASA CLASS:   Class II INDICATIONS:average risk screening. MEDICATIONS: MAC sedation, administered by CRNA and propofol (Diprivan) 250mg  IV  DESCRIPTION OF PROCEDURE:   After the risks benefits and alternatives of the procedure were thoroughly explained, informed consent was obtained.  A digital rectal exam revealed no abnormalities of the rectum.   The LB [id number]  endoscope was introduced through the anus and advanced to the cecum, which was identified by both the appendix and ileocecal valve. No adverse events experienced.   The quality of the prep was good, using MoviPrep  The instrument was then slowly withdrawn as the colon was fully examined.  COLON FINDINGS: Five polyps ranging between 3-62mm in size were found in the ascending colon (3), descending colon, and rectum.  A polypectomy was performed with a cold snare.  The resection was complete and the polyp tissue was completely retrieved.   Moderate diverticulosis was noted The finding was in the left colon.   The colon mucosa was otherwise normal.  Retroflexed views revealed internal hemorrhoids. The time to cecum=1 minutes 59 seconds. Withdrawal time=16 minutes 22 seconds.  The scope was withdrawn and the procedure completed. COMPLICATIONS: There were no complications.  ENDOSCOPIC IMPRESSION: 1.   Five polyps were found in the  colon;  polypectomy was performed with a cold snare 2.   Moderate diverticulosis was noted in the left colon 3.   The colon mucosa was otherwise normal  RECOMMENDATIONS: 1. Follow up colonoscopy in 3 or 5 years, pending pathology results   eSigned:  Roxy Cedar, MD 07/19/2013 11:47 AM   cc: Robert Bellow, MD and The Patient

## 2013-07-19 NOTE — Progress Notes (Signed)
Procedure ends, to recovery, report given and VSS. 

## 2013-07-19 NOTE — Progress Notes (Signed)
Patient did not experience any of the following events: a burn prior to discharge; a fall within the facility; wrong site/side/patient/procedure/implant event; or a hospital transfer or hospital admission upon discharge from the facility. (G8907) Patient did not have preoperative order for IV antibiotic SSI prophylaxis. (G8918)  

## 2013-07-19 NOTE — Patient Instructions (Signed)
YOU HAD AN ENDOSCOPIC PROCEDURE TODAY AT THE [facility]: Refer to the procedure report that was given to you for any specific questions about what was found during the examination.  If the procedure report does not answer your questions, please call your gastroenterologist to clarify.  If you requested that your care partner not be given the details of your procedure findings, then the procedure report has been included in a sealed envelope for you to review at your convenience later.  YOU SHOULD EXPECT: Some feelings of bloating in the abdomen. Passage of more gas than usual.  Walking can help get rid of the air that was put into your GI tract during the procedure and reduce the bloating. If you had a lower endoscopy (such as a colonoscopy or flexible sigmoidoscopy) you may notice spotting of blood in your stool or on the toilet paper. If you underwent a bowel prep for your procedure, then you may not have a normal bowel movement for a few days.  DIET: Your first meal following the procedure should be a light meal and then it is ok to progress to your normal diet.  A half-sandwich or bowl of soup is an example of a good first meal.  Heavy or fried foods are harder to digest and may make you feel nauseous or bloated.  Likewise meals heavy in dairy and vegetables can cause extra gas to form and this can also increase the bloating.  Drink plenty of fluids but you should avoid alcoholic beverages for 24 hours.  ACTIVITY: Your care partner should take you home directly after the procedure.  You should plan to take it easy, moving slowly for the rest of the day.  You can resume normal activity the day after the procedure however you should NOT DRIVE or use heavy machinery for 24 hours (because of the sedation medicines used during the test).    SYMPTOMS TO REPORT IMMEDIATELY: A gastroenterologist can be reached at any hour.  During normal business hours, 8:30 AM to 5:00 PM Monday through Friday,  call [phone].  After hours and on weekends, please call the GI answering service at [phone] who will take a message and have the physician on call contact you.   Following lower endoscopy (colonoscopy or flexible sigmoidoscopy):  Excessive amounts of blood in the stool  Significant tenderness or worsening of abdominal pains  Swelling of the abdomen that is new, acute  Fever of 100F or higher   FOLLOW UP: If any biopsies were taken you will be contacted by phone or by letter within the next 1-3 weeks.  Call your gastroenterologist if you have not heard about the biopsies in 3 weeks.  Our staff will call the home number listed on your records the next business day following your procedure to check on you and address any questions or concerns that you may have at that time regarding the information given to you following your procedure. This is a courtesy call and so if there is no answer at the home number and we have not heard from you through the emergency physician on call, we will assume that you have returned to your regular daily activities without incident.  SIGNATURES/CONFIDENTIALITY: You and/or your care partner have signed paperwork which will be entered into your electronic medical record.  These signatures attest to the fact that that the information above on your After Visit Summary has been reviewed and is understood.  Full responsibility of the confidentiality of   this discharge information lies with you and/or your care-partner.   Resume medications. Information given on polyps,diverticulosis and high fiber diet with discharge instructions. 

## 2013-07-19 NOTE — Progress Notes (Signed)
Called to room to assist during endoscopic procedure.  Patient ID and intended procedure confirmed with present staff. Received instructions for my participation in the procedure from the performing physician.  

## 2013-07-20 ENCOUNTER — Telehealth: Payer: Self-pay | Admitting: *Deleted

## 2013-07-20 NOTE — Telephone Encounter (Signed)
  Follow up Call-  Call back number 07/19/2013  Post procedure Call Back phone  # [phone]  Permission to leave phone message Yes     Patient questions:  Do you have a fever, pain , or abdominal swelling? no Pain Score  0 *  Have you tolerated food without any problems? yes  Have you been able to return to your normal activities? yes  Do you have any questions about your discharge instructions: Diet   no Medications  no Follow up visit  no  Do you have questions or concerns about your Care? no  Actions: * If pain score is 4 or above: No action needed, pain <4.

## 2013-07-26 ENCOUNTER — Encounter: Payer: Self-pay | Admitting: Internal Medicine

## 2013-12-17 ENCOUNTER — Telehealth: Payer: Self-pay

## 2013-12-17 NOTE — Telephone Encounter (Addendum)
Patient is requesting Dr. Elder Cyphers to call him.   His medication has increased in cost.  He would like to talk with Dr. Elder Cyphers.   [phone]

## 2013-12-17 NOTE — Telephone Encounter (Signed)
Wants a different gout medication.  He states the medication he was taking (colchicine) is no longer available on the market.  Wants an RX for another medication that is generic.  [facility] [address]

## 2013-12-21 NOTE — Telephone Encounter (Signed)
RTC to discuss new medicine, can see any provider.

## 2013-12-22 NOTE — Telephone Encounter (Signed)
Spoke to patient. Advised that he RTC to discuss new medicine with Dr. Elder Cyphers.  He said he would do so.

## 2014-01-28 ENCOUNTER — Other Ambulatory Visit: Payer: Self-pay

## 2014-01-28 DIAGNOSIS — M109 Gout, unspecified: Secondary | ICD-10-CM

## 2014-01-28 MED ORDER — ALLOPURINOL 300 MG PO TABS: 300.0000 mg | ORAL_TABLET | Freq: Every day | ORAL | Status: DC

## 2014-03-07 ENCOUNTER — Other Ambulatory Visit: Payer: Self-pay | Admitting: Emergency Medicine

## 2014-03-10 ENCOUNTER — Other Ambulatory Visit: Payer: Self-pay | Admitting: *Deleted

## 2014-03-10 ENCOUNTER — Telehealth: Payer: Self-pay

## 2014-03-10 MED ORDER — TRIAMTERENE-HCTZ 75-50 MG PO TABS: 1.0000 | ORAL_TABLET | Freq: Every day | ORAL | Status: DC

## 2014-03-10 NOTE — Telephone Encounter (Signed)
Okayed one month but he does need to be seen.  Pt advised

## 2014-03-10 NOTE — Telephone Encounter (Signed)
Patient states he is at the pharmacy now and they sent [place] a request for a refill on 7/6 for patient's BP medication. Pharmacy has not heard anything back from [place]. Please return call and advise.

## 2014-04-13 ENCOUNTER — Ambulatory Visit (INDEPENDENT_AMBULATORY_CARE_PROVIDER_SITE_OTHER): Payer: Self-pay | Admitting: Family Medicine

## 2014-04-13 VITALS — BP 124/80 | HR 70 | Temp 98.2°F | Resp 16 | Ht 69.0 in | Wt 213.0 lb

## 2014-04-13 DIAGNOSIS — I10 Essential (primary) hypertension: Secondary | ICD-10-CM

## 2014-04-13 DIAGNOSIS — M1A9XX1 Chronic gout, unspecified, with tophus (tophi): Secondary | ICD-10-CM

## 2014-04-13 DIAGNOSIS — M1A00X1 Idiopathic chronic gout, unspecified site, with tophus (tophi): Secondary | ICD-10-CM

## 2014-04-13 MED ORDER — TRIAMTERENE-HCTZ 75-50 MG PO TABS: 1.0000 | ORAL_TABLET | Freq: Every day | ORAL | Status: DC

## 2014-04-13 MED ORDER — COLCHICINE 0.6 MG PO TABS: ORAL_TABLET | ORAL | Status: DC

## 2014-04-13 MED ORDER — ALLOPURINOL 300 MG PO TABS: 300.0000 mg | ORAL_TABLET | Freq: Every day | ORAL | Status: DC

## 2014-04-13 NOTE — Progress Notes (Signed)
Chief Complaint:  Chief Complaint  Patient presents with  . Medication Refill    all    HPI: Jerry Chapman is a 63 y.o. male who is here for : HTN-doing well, no SEs, no CP, dizziness Gout-on allopurinol, on colcrys He states he has been on colcrys for a while and would liek something else since too expensive , we talked about indomethacine but he cannot take it sicne he has some kidney dysfunction baseline creatinine is 1.62-1.63 He is doing well. He is a Clinical biochemist. He as no issues with his prostate, no sxs. No weigthloss or bone pain that is outside of gout  Past Medical History  Diagnosis Date  . Hypertension   . History of prostate cancer     S/P RADIOACTIVE SEED IMPLANTS--  no recurrence  . Phimosis   . Arthritis, gouty    Past Surgical History  Procedure Laterality Date  . Prostate biopsy       + adencarcinoma  . Radioactive prostate seed implants  2004  . Circumcision N/A 07/05/2013    Procedure: CIRCUMCISION ADULT, aspiration of right hydrocele, meatal dilitation;  Surgeon: Claybon Jabs, MD;  Location: [facility];  Service: Urology;  Laterality: N/A;   History   Social History  . Marital Status: Divorced    Spouse Name: N/A    Number of Children: N/A  . Years of Education: N/A   Social History Main Topics  . Smoking status: Never Smoker   . Smokeless tobacco: Never Used  . Alcohol Use: 1.0 oz/week    2 drink(s) per week  . Drug Use: No  . Sexual Activity: None   Other Topics Concern  . None   Social History Narrative  . None   Family History  Problem Relation Age of Onset  . Colon cancer Neg Hx   . Pancreatic cancer Neg Hx   . Stomach cancer Neg Hx   . Esophageal cancer Neg Hx    No Known Allergies Prior to Admission medications   Medication Sig Start Date End Date Taking? Authorizing Provider  allopurinol (ZYLOPRIM) 300 MG tablet Take 1 tablet (300 mg total) by mouth daily. PATIENT NEEDS OFFICE VISIT FOR  ADDITIONAL REFILLS 01/28/14  Yes Ellison Carwin, MD  COLCRYS 0.6 MG tablet TAKE 1 TABLET EVERY DAY 03/07/14  Yes Mancel Bale, PA-C  triamterene-hydrochlorothiazide (MAXZIDE) 75-50 MG per tablet Take 1 tablet by mouth daily. Needs office visit 03/10/14  Yes Heather M Marte, PA-C     ROS: The patient denies fevers, chills, night sweats, unintentional weight loss, chest pain, palpitations, wheezing, dyspnea on exertion, nausea, vomiting, abdominal pain, dysuria, hematuria, melena, numbness, weakness, or tingling.   All other systems have been reviewed and were otherwise negative with the exception of those mentioned in the HPI and as above.    PHYSICAL EXAM: Filed Vitals:   04/13/14 2001  BP: 124/80  Pulse: 70  Temp: 98.2 F (36.8 C)  Resp: 16   Filed Vitals:   04/13/14 2001  Height: 5\' 9"  (1.753 m)  Weight: 213 lb (96.616 kg)   Body mass index is 31.44 kg/(m^2).  General: Alert, no acute distress HEENT:  Normocephalic, atraumatic, oropharynx patent. EOMI, PERRLA Cardiovascular:  Regular rate and rhythm, no rubs murmurs or gallops.  No Carotid bruits, radial pulse intact. No pedal edema.  Respiratory: Clear to auscultation bilaterally.  No wheezes, rales, or rhonchi.  No cyanosis, no use of accessory musculature GI: No  organomegaly, abdomen is soft and non-tender, positive bowel sounds.  No masses. Skin: No rashes. Neurologic: Facial musculature symmetric. Psychiatric: Patient is appropriate throughout our interaction. Lymphatic: No cervical lymphadenopathy Musculoskeletal: Gait intact. 5/5 strength   LABS: Results for orders placed during the hospital encounter of 07/05/13  POCT I-STAT 4, (NA,K, GLUC, HGB,HCT)      Result Value Ref Range   Sodium 143  135 - 145 mEq/L   Potassium 3.7  3.5 - 5.1 mEq/L   Glucose, Bld 115 (*) 70 - 99 mg/dL   HCT 57.0 (*) 39.0 - 52.0 %   Hemoglobin 19.4 (*) 13.0 - 17.0 g/dL     EKG/XRAY:   Primary read interpreted by Dr. Marin Comment at  [facility].   ASSESSMENT/PLAN: Encounter Diagnoses  Name Primary?  . Essential hypertension Yes  . Chronic gout with tophus, unspecified cause, unspecified site    Refill meds for a year He can call back and get allopurinol rx refills in 6 months if run out for an additional 6 months if creatinine is stable.   CMP pending F/u in 6 months  Gross sideeffects, risk and benefits, and alternatives of medications d/w patient. Patient is aware that all medications have potential sideeffects and we are unable to predict every sideeffect or drug-drug interaction that may occur.  Margaretha Mahan, [place], DO 04/13/2014 8:26 PM

## 2014-04-14 LAB — COMPLETE METABOLIC PANEL WITH GFR
ALT: 21 U/L (ref 0–53)
AST: 25 U/L (ref 0–37)
Albumin: 4.5 g/dL (ref 3.5–5.2)
Alkaline Phosphatase: 47 U/L (ref 39–117)
BUN: 21 mg/dL (ref 6–23)
CO2: 28 mEq/L (ref 19–32)
Calcium: 10.3 mg/dL (ref 8.4–10.5)
Chloride: 99 mEq/L (ref 96–112)
Creat: 1.37 mg/dL — ABNORMAL HIGH (ref 0.50–1.35)
GFR, Est African American: 63 mL/min
GFR, Est Non African American: 55 mL/min — ABNORMAL LOW
Glucose, Bld: 75 mg/dL (ref 70–99)
Potassium: 5 mEq/L (ref 3.5–5.3)
Sodium: 138 mEq/L (ref 135–145)
Total Bilirubin: 0.6 mg/dL (ref 0.2–1.2)
Total Protein: 8.2 g/dL (ref 6.0–8.3)

## 2014-04-29 ENCOUNTER — Encounter: Payer: Self-pay | Admitting: Family Medicine

## 2014-05-18 ENCOUNTER — Ambulatory Visit (INDEPENDENT_AMBULATORY_CARE_PROVIDER_SITE_OTHER): Payer: Self-pay | Admitting: Family Medicine

## 2014-05-18 VITALS — BP 126/74 | HR 73 | Temp 98.5°F | Resp 18 | Ht 70.0 in | Wt 218.0 lb

## 2014-05-18 DIAGNOSIS — M10042 Idiopathic gout, left hand: Secondary | ICD-10-CM

## 2014-05-18 DIAGNOSIS — M109 Gout, unspecified: Secondary | ICD-10-CM

## 2014-05-18 MED ORDER — PREDNISONE 10 MG PO TABS: ORAL_TABLET | ORAL | Status: DC

## 2014-05-18 NOTE — Progress Notes (Signed)
   Subjective:    Patient ID: Jerry Chapman, male    DOB: Aug 22, 1951, 63 y.o.   MRN: [id number]  HPI Patient presents today with 6 day history of gout flare of his left hand. He does not know of any triggers. He doubled up on his colchicine for several days without relief- this usually wards off an attack. Has not taken any colchicine yesterday or today since he doesn't feel like it is working. He stopped his allopurinol at the onset of the attack. This is his typical gout site. Had been well controlled on allopurinol/colchicine and naprosyn for many years- has come off naprosyn due to increased creatinine, age, elevated BP per Dr. Elder Cyphers.  He is going out of town to attend a funeral today and is requesting prednisone which has worked well for him in the past.   Review of Systems No fever, no chest pain, no SOB    Objective:   Physical Exam  Vitals reviewed. Constitutional: He is oriented to person, place, and time. He appears well-developed and well-nourished.  HENT:  Head: Normocephalic and atraumatic.  Neck: Normal range of motion. Neck supple.  Cardiovascular: Normal rate, regular rhythm and normal heart sounds.   Pulmonary/Chest: Effort normal and breath sounds normal.  Musculoskeletal: Normal range of motion.  Left second MC joint swollen and painful to palpation. Slightly red, no warmth. Hand with good ROM, strength, brisk cap refill.  Neurological: He is alert and oriented to person, place, and time.  Skin: Skin is warm and dry.  Psychiatric: He has a normal mood and affect. His behavior is normal. Judgment and thought content normal.      Assessment & Plan:  1. Acute idiopathic gout of left hand - predniSONE (DELTASONE) 10 MG tablet; Take 6,5,4,3,2,1.  Dispense: 21 tablet; Refill: 0 -Provided written and verbal information regarding diagnosis and treatment. -RTC if worsening symptoms or if no improvement in 2-3 days  Elby Beck, FNP-BC  [facility]  05/19/2014 6:21 AM

## 2014-05-18 NOTE — Patient Instructions (Signed)
Goodrx.com to compare prices of medicines Gout Gout is an inflammatory arthritis caused by a buildup of uric acid crystals in the joints. Uric acid is a chemical that is normally present in the blood. When the level of uric acid in the blood is too high it can form crystals that deposit in your joints and tissues. This causes joint redness, soreness, and swelling (inflammation). Repeat attacks are common. Over time, uric acid crystals can form into masses (tophi) near a joint, destroying bone and causing disfigurement. Gout is treatable and often preventable. CAUSES  The disease begins with elevated levels of uric acid in the blood. Uric acid is produced by your body when it breaks down a naturally found substance called purines. Certain foods you eat, such as meats and fish, contain high amounts of purines. Causes of an elevated uric acid level include:  Being passed down from parent to child (heredity).  Diseases that cause increased uric acid production (such as obesity, psoriasis, and certain cancers).  Excessive alcohol use.  Diet, especially diets rich in meat and seafood.  Medicines, including certain cancer-fighting medicines (chemotherapy), water pills (diuretics), and aspirin.  Chronic kidney disease. The kidneys are no longer able to remove uric acid well.  Problems with metabolism. Conditions strongly associated with gout include:  Obesity.  High blood pressure.  High cholesterol.  Diabetes. Not everyone with elevated uric acid levels gets gout. It is not understood why some people get gout and others do not. Surgery, joint injury, and eating too much of certain foods are some of the factors that can lead to gout attacks. SYMPTOMS   An attack of gout comes on quickly. It causes intense pain with redness, swelling, and warmth in a joint.  Fever can occur.  Often, only one joint is involved. Certain joints are more commonly involved:  Base of the big  toe.  Knee.  Ankle.  Wrist.  Finger. Without treatment, an attack usually goes away in a few days to weeks. Between attacks, you usually will not have symptoms, which is different from many other forms of arthritis. DIAGNOSIS  Your caregiver will suspect gout based on your symptoms and exam. In some cases, tests may be recommended. The tests may include:  Blood tests.  Urine tests.  X-rays.  Joint fluid exam. This exam requires a needle to remove fluid from the joint (arthrocentesis). Using a microscope, gout is confirmed when uric acid crystals are seen in the joint fluid. TREATMENT  There are two phases to gout treatment: treating the sudden onset (acute) attack and preventing attacks (prophylaxis).  Treatment of an Acute Attack.  Medicines are used. These include anti-inflammatory medicines or steroid medicines.  An injection of steroid medicine into the affected joint is sometimes necessary.  The painful joint is rested. Movement can worsen the arthritis.  You may use warm or cold treatments on painful joints, depending which works best for you.  Treatment to Prevent Attacks.  If you suffer from frequent gout attacks, your caregiver may advise preventive medicine. These medicines are started after the acute attack subsides. These medicines either help your kidneys eliminate uric acid from your body or decrease your uric acid production. You may need to stay on these medicines for a very long time.  The early phase of treatment with preventive medicine can be associated with an increase in acute gout attacks. For this reason, during the first few months of treatment, your caregiver may also advise you to take medicines usually used for  acute gout treatment. Be sure you understand your caregiver's directions. Your caregiver may make several adjustments to your medicine dose before these medicines are effective.  Discuss dietary treatment with your caregiver or dietitian.  Alcohol and drinks high in sugar and fructose and foods such as meat, poultry, and seafood can increase uric acid levels. Your caregiver or dietitian can advise you on drinks and foods that should be limited. HOME CARE INSTRUCTIONS   Do not take aspirin to relieve pain. This raises uric acid levels.  Only take over-the-counter or prescription medicines for pain, discomfort, or fever as directed by your caregiver.  Rest the joint as much as possible. When in bed, keep sheets and blankets off painful areas.  Keep the affected joint raised (elevated).  Apply warm or cold treatments to painful joints. Use of warm or cold treatments depends on which works best for you.  Use crutches if the painful joint is in your leg.  Drink enough fluids to keep your urine clear or pale yellow. This helps your body get rid of uric acid. Limit alcohol, sugary drinks, and fructose drinks.  Follow your dietary instructions. Pay careful attention to the amount of protein you eat. Your daily diet should emphasize fruits, vegetables, whole grains, and fat-free or low-fat milk products. Discuss the use of coffee, vitamin C, and cherries with your caregiver or dietitian. These may be helpful in lowering uric acid levels.  Maintain a healthy body weight. SEEK MEDICAL CARE IF:   You develop diarrhea, vomiting, or any side effects from medicines.  You do not feel better in 24 hours, or you are getting worse. SEEK IMMEDIATE MEDICAL CARE IF:   Your joint becomes suddenly more tender, and you have chills or a fever. MAKE SURE YOU:   Understand these instructions.  Will watch your condition.  Will get help right away if you are not doing well or get worse. Document Released: 08/09/2000 Document Revised: 12/27/2013 Document Reviewed: 03/25/2012 [facility] Patient Information 2015 [place], [place]. This information is not intended to replace advice given to you by your health care provider. Make sure you discuss any  questions you have with your health care provider.

## 2014-06-12 ENCOUNTER — Ambulatory Visit (INDEPENDENT_AMBULATORY_CARE_PROVIDER_SITE_OTHER): Payer: Self-pay

## 2014-06-12 ENCOUNTER — Ambulatory Visit (INDEPENDENT_AMBULATORY_CARE_PROVIDER_SITE_OTHER): Payer: Self-pay | Admitting: Family Medicine

## 2014-06-12 VITALS — BP 120/78 | HR 64 | Temp 98.5°F | Resp 12 | Ht 69.0 in | Wt 217.0 lb

## 2014-06-12 DIAGNOSIS — M545 Low back pain: Secondary | ICD-10-CM

## 2014-06-12 LAB — POCT URINALYSIS DIPSTICK
BILIRUBIN UA: NEGATIVE
Glucose, UA: NEGATIVE
Ketones, UA: NEGATIVE
LEUKOCYTES UA: NEGATIVE
NITRITE UA: NEGATIVE
Protein, UA: NEGATIVE
RBC UA: NEGATIVE
Spec Grav, UA: 1.02
UROBILINOGEN UA: 0.2
pH, UA: 5.5

## 2014-06-12 LAB — POCT UA - MICROSCOPIC ONLY
CASTS, UR, LPF, POC: NEGATIVE
CRYSTALS, UR, HPF, POC: NEGATIVE
Epithelial cells, urine per micros: NEGATIVE
Mucus, UA: NEGATIVE
RBC, urine, microscopic: NEGATIVE
WBC, Ur, HPF, POC: NEGATIVE
Yeast, UA: NEGATIVE

## 2014-06-12 MED ORDER — KETOROLAC TROMETHAMINE 60 MG/2ML IM SOLN
60.0000 mg | Freq: Once | INTRAMUSCULAR | Status: AC
  Administered 2014-06-12: 60 mg via INTRAMUSCULAR

## 2014-06-12 NOTE — Patient Instructions (Signed)
1.  Take Ketoconazole tablets for fungus. 2.  Take Methocarbamol as directed for back pain (muscle relaxer). 3.  Take hydrocodone as directed for back pain (pain medication).  Low Back Sprain with Rehab  A sprain is an injury in which a ligament is torn. The ligaments of the lower back are vulnerable to sprains. However, they are strong and require great force to be injured. These ligaments are important for stabilizing the spinal column. Sprains are classified into three categories. Grade 1 sprains cause pain, but the tendon is not lengthened. Grade 2 sprains include a lengthened ligament, due to the ligament being stretched or partially ruptured. With grade 2 sprains there is still function, although the function may be decreased. Grade 3 sprains involve a complete tear of the tendon or muscle, and function is usually impaired. SYMPTOMS   Severe pain in the lower back.  Sometimes, a feeling of a "pop," "snap," or tear, at the time of injury.  Tenderness and sometimes swelling at the injury site.  Uncommonly, bruising (contusion) within 48 hours of injury.  Muscle spasms in the back. CAUSES  Low back sprains occur when a force is placed on the ligaments that is greater than they can handle. Common causes of injury include:  Performing a stressful act while off-balance.  Repetitive stressful activities that involve movement of the lower back.  Direct hit (trauma) to the lower back. RISK INCREASES WITH:  Contact sports (football, wrestling).  Collisions (major skiing accidents).  Sports that require throwing or lifting (baseball, weightlifting).  Sports involving twisting of the spine (gymnastics, diving, tennis, golf).  Poor strength and flexibility.  Inadequate protection.  Previous back injury or surgery (especially fusion). PREVENTION  Wear properly fitted and padded protective equipment.  Warm up and stretch properly before activity.  Allow for adequate recovery  between workouts.  Maintain physical fitness:  Strength, flexibility, and endurance.  Cardiovascular fitness.  Maintain a healthy body weight. PROGNOSIS  If treated properly, low back sprains usually heal with non-surgical treatment. The length of time for healing depends on the severity of the injury.  RELATED COMPLICATIONS   Recurring symptoms, resulting in a chronic problem.  Chronic inflammation and pain in the low back.  Delayed healing or resolution of symptoms, especially if activity is resumed too soon.  Prolonged impairment.  Unstable or arthritic joints of the low back. TREATMENT  Treatment first involves the use of ice and medicine, to reduce pain and inflammation. The use of strengthening and stretching exercises may help reduce pain with activity. These exercises may be performed at home or with a therapist. Severe injuries may require referral to a therapist for further evaluation and treatment, such as ultrasound. Your caregiver may advise that you wear a back brace or corset, to help reduce pain and discomfort. Often, prolonged bed rest results in greater harm then benefit. Corticosteroid injections may be recommended. However, these should be reserved for the most serious cases. It is important to avoid using your back when lifting objects. At night, sleep on your back on a firm mattress, with a pillow placed under your knees. If non-surgical treatment is unsuccessful, surgery may be needed.  MEDICATION   If pain medicine is needed, nonsteroidal anti-inflammatory medicines (aspirin and ibuprofen), or other minor pain relievers (acetaminophen), are often advised.  Do not take pain medicine for 7 days before surgery.  Prescription pain relievers may be given, if your caregiver thinks they are needed. Use only as directed and only as much as  you need.  Ointments applied to the skin may be helpful.  Corticosteroid injections may be given by your caregiver. These  injections should be reserved for the most serious cases, because they may only be given a certain number of times. HEAT AND COLD  Cold treatment (icing) should be applied for 10 to 15 minutes every 2 to 3 hours for inflammation and pain, and immediately after activity that aggravates your symptoms. Use ice packs or an ice massage.  Heat treatment may be used before performing stretching and strengthening activities prescribed by your caregiver, physical therapist, or athletic trainer. Use a heat pack or a warm water soak. SEEK MEDICAL CARE IF:   Symptoms get worse or do not improve in 2 to 4 weeks, despite treatment.  You develop numbness or weakness in either leg.  You lose bowel or bladder function.  Any of the following occur after surgery: fever, increased pain, swelling, redness, drainage of fluids, or bleeding in the affected area.  New, unexplained symptoms develop. (Drugs used in treatment may produce side effects.) EXERCISES  RANGE OF MOTION (ROM) AND STRETCHING EXERCISES - Low Back Sprain Most people with lower back pain will find that their symptoms get worse with excessive bending forward (flexion) or arching at the lower back (extension). The exercises that will help resolve your symptoms will focus on the opposite motion.  Your physician, physical therapist or athletic trainer will help you determine which exercises will be most helpful to resolve your lower back pain. Do not complete any exercises without first consulting with your caregiver. Discontinue any exercises which make your symptoms worse, until you speak to your caregiver. If you have pain, numbness or tingling which travels down into your buttocks, leg or foot, the goal of the therapy is for these symptoms to move closer to your back and eventually resolve. Sometimes, these leg symptoms will get better, but your lower back pain may worsen. This is often an indication of progress in your rehabilitation. Be very alert  to any changes in your symptoms and the activities in which you participated in the 24 hours prior to the change. Sharing this information with your caregiver will allow him or her to most efficiently treat your condition. These exercises may help you when beginning to rehabilitate your injury. Your symptoms may resolve with or without further involvement from your physician, physical therapist or athletic trainer. While completing these exercises, remember:   Restoring tissue flexibility helps normal motion to return to the joints. This allows healthier, less painful movement and activity.  An effective stretch should be held for at least 30 seconds.  A stretch should never be painful. You should only feel a gentle lengthening or release in the stretched tissue. FLEXION RANGE OF MOTION AND STRETCHING EXERCISES: STRETCH - Flexion, Single Knee to Chest   Lie on a firm bed or floor with both legs extended in front of you.  Keeping one leg in contact with the floor, bring your opposite knee to your chest. Hold your leg in place by either grabbing behind your thigh or at your knee.  Pull until you feel a gentle stretch in your low back. Hold __________ seconds.  Slowly release your grasp and repeat the exercise with the opposite side. Repeat __________ times. Complete this exercise __________ times per day.  STRETCH - Flexion, Double Knee to Chest  Lie on a firm bed or floor with both legs extended in front of you.  Keeping one leg in contact with  the floor, bring your opposite knee to your chest.  Tense your stomach muscles to support your back and then lift your other knee to your chest. Hold your legs in place by either grabbing behind your thighs or at your knees.  Pull both knees toward your chest until you feel a gentle stretch in your low back. Hold __________ seconds.  Tense your stomach muscles and slowly return one leg at a time to the floor. Repeat __________ times. Complete this  exercise __________ times per day.  STRETCH - Low Trunk Rotation  Lie on a firm bed or floor. Keeping your legs in front of you, bend your knees so they are both pointed toward the ceiling and your feet are flat on the floor.  Extend your arms out to the side. This will stabilize your upper body by keeping your shoulders in contact with the floor.  Gently and slowly drop both knees together to one side until you feel a gentle stretch in your low back. Hold for __________ seconds.  Tense your stomach muscles to support your lower back as you bring your knees back to the starting position. Repeat the exercise to the other side. Repeat __________ times. Complete this exercise __________ times per day  EXTENSION RANGE OF MOTION AND FLEXIBILITY EXERCISES: STRETCH - Extension, Prone on Elbows   Lie on your stomach on the floor, a bed will be too soft. Place your palms about shoulder width apart and at the height of your head.  Place your elbows under your shoulders. If this is too painful, stack pillows under your chest.  Allow your body to relax so that your hips drop lower and make contact more completely with the floor.  Hold this position for __________ seconds.  Slowly return to lying flat on the floor. Repeat __________ times. Complete this exercise __________ times per day.  RANGE OF MOTION - Extension, Prone Press Ups  Lie on your stomach on the floor, a bed will be too soft. Place your palms about shoulder width apart and at the height of your head.  Keeping your back as relaxed as possible, slowly straighten your elbows while keeping your hips on the floor. You may adjust the placement of your hands to maximize your comfort. As you gain motion, your hands will come more underneath your shoulders.  Hold this position __________ seconds.  Slowly return to lying flat on the floor. Repeat __________ times. Complete this exercise __________ times per day.  RANGE OF MOTION- Quadruped,  Neutral Spine   Assume a hands and knees position on a firm surface. Keep your hands under your shoulders and your knees under your hips. You may place padding under your knees for comfort.  Drop your head and point your tailbone toward the ground below you. This will round out your lower back like an angry cat. Hold this position for __________ seconds.  Slowly lift your head and release your tail bone so that your back sags into a large arch, like an old horse.  Hold this position for __________ seconds.  Repeat this until you feel limber in your low back.  Now, find your "sweet spot." This will be the most comfortable position somewhere between the two previous positions. This is your neutral spine. Once you have found this position, tense your stomach muscles to support your low back.  Hold this position for __________ seconds. Repeat __________ times. Complete this exercise __________ times per day.  STRENGTHENING EXERCISES - Low Back Sprain  These exercises may help you when beginning to rehabilitate your injury. These exercises should be done near your "sweet spot." This is the neutral, low-back arch, somewhere between fully rounded and fully arched, that is your least painful position. When performed in this safe range of motion, these exercises can be used for people who have either a flexion or extension based injury. These exercises may resolve your symptoms with or without further involvement from your physician, physical therapist or athletic trainer. While completing these exercises, remember:   Muscles can gain both the endurance and the strength needed for everyday activities through controlled exercises.  Complete these exercises as instructed by your physician, physical therapist or athletic trainer. Increase the resistance and repetitions only as guided.  You may experience muscle soreness or fatigue, but the pain or discomfort you are trying to eliminate should never worsen  during these exercises. If this pain does worsen, stop and make certain you are following the directions exactly. If the pain is still present after adjustments, discontinue the exercise until you can discuss the trouble with your caregiver. STRENGTHENING - Deep Abdominals, Pelvic Tilt   Lie on a firm bed or floor. Keeping your legs in front of you, bend your knees so they are both pointed toward the ceiling and your feet are flat on the floor.  Tense your lower abdominal muscles to press your low back into the floor. This motion will rotate your pelvis so that your tail bone is scooping upwards rather than pointing at your feet or into the floor. With a gentle tension and even breathing, hold this position for __________ seconds. Repeat __________ times. Complete this exercise __________ times per day.  STRENGTHENING - Abdominals, Crunches   Lie on a firm bed or floor. Keeping your legs in front of you, bend your knees so they are both pointed toward the ceiling and your feet are flat on the floor. Cross your arms over your chest.  Slightly tip your chin down without bending your neck.  Tense your abdominals and slowly lift your trunk high enough to just clear your shoulder blades. Lifting higher can put excessive stress on the lower back and does not further strengthen your abdominal muscles.  Control your return to the starting position. Repeat __________ times. Complete this exercise __________ times per day.  STRENGTHENING - Quadruped, Opposite UE/LE Lift   Assume a hands and knees position on a firm surface. Keep your hands under your shoulders and your knees under your hips. You may place padding under your knees for comfort.  Find your neutral spine and gently tense your abdominal muscles so that you can maintain this position. Your shoulders and hips should form a rectangle that is parallel with the floor and is not twisted.  Keeping your trunk steady, lift your right hand no higher  than your shoulder and then your left leg no higher than your hip. Make sure you are not holding your breath. Hold this position for __________ seconds.  Continuing to keep your abdominal muscles tense and your back steady, slowly return to your starting position. Repeat with the opposite arm and leg. Repeat __________ times. Complete this exercise __________ times per day.  STRENGTHENING - Abdominals and Quadriceps, Straight Leg Raise   Lie on a firm bed or floor with both legs extended in front of you.  Keeping one leg in contact with the floor, bend the other knee so that your foot can rest flat on the floor.  Find your  neutral spine, and tense your abdominal muscles to maintain your spinal position throughout the exercise.  Slowly lift your straight leg off the floor about 6 inches for a count of 15, making sure to not hold your breath.  Still keeping your neutral spine, slowly lower your leg all the way to the floor. Repeat this exercise with each leg __________ times. Complete this exercise __________ times per day. POSTURE AND BODY MECHANICS CONSIDERATIONS - Low Back Sprain Keeping correct posture when sitting, standing or completing your activities will reduce the stress put on different body tissues, allowing injured tissues a chance to heal and limiting painful experiences. The following are general guidelines for improved posture. Your physician or physical therapist will provide you with any instructions specific to your needs. While reading these guidelines, remember:  The exercises prescribed by your provider will help you have the flexibility and strength to maintain correct postures.  The correct posture provides the best environment for your joints to work. All of your joints have less wear and tear when properly supported by a spine with good posture. This means you will experience a healthier, less painful body.  Correct posture must be practiced with all of your activities,  especially prolonged sitting and standing. Correct posture is as important when doing repetitive low-stress activities (typing) as it is when doing a single heavy-load activity (lifting). RESTING POSITIONS Consider which positions are most painful for you when choosing a resting position. If you have pain with flexion-based activities (sitting, bending, stooping, squatting), choose a position that allows you to rest in a less flexed posture. You would want to avoid curling into a fetal position on your side. If your pain worsens with extension-based activities (prolonged standing, working overhead), avoid resting in an extended position such as sleeping on your stomach. Most people will find more comfort when they rest with their spine in a more neutral position, neither too rounded nor too arched. Lying on a non-sagging bed on your side with a pillow between your knees, or on your back with a pillow under your knees will often provide some relief. Keep in mind, being in any one position for a prolonged period of time, no matter how correct your posture, can still lead to stiffness. PROPER SITTING POSTURE In order to minimize stress and discomfort on your spine, you must sit with correct posture. Sitting with good posture should be effortless for a healthy body. Returning to good posture is a gradual process. Many people can work toward this most comfortably by using various supports until they have the flexibility and strength to maintain this posture on their own. When sitting with proper posture, your ears will fall over your shoulders and your shoulders will fall over your hips. You should use the back of the chair to support your upper back. Your lower back will be in a neutral position, just slightly arched. You may place a small pillow or folded towel at the base of your lower back for  support.  When working at a desk, create an environment that supports good, upright posture. Without extra support,  muscles tire, which leads to excessive strain on joints and other tissues. Keep these recommendations in mind: CHAIR:  A chair should be able to slide under your desk when your back makes contact with the back of the chair. This allows you to work closely.  The chair's height should allow your eyes to be level with the upper part of your monitor and your hands  to be slightly lower than your elbows. BODY POSITION  Your feet should make contact with the floor. If this is not possible, use a foot rest.  Keep your ears over your shoulders. This will reduce stress on your neck and low back. INCORRECT SITTING POSTURES  If you are feeling tired and unable to assume a healthy sitting posture, do not slouch or slump. This puts excessive strain on your back tissues, causing more damage and pain. Healthier options include:  Using more support, like a lumbar pillow.  Switching tasks to something that requires you to be upright or walking.  Talking a brief walk.  Lying down to rest in a neutral-spine position. PROLONGED STANDING WHILE SLIGHTLY LEANING FORWARD  When completing a task that requires you to lean forward while standing in one place for a long time, place either foot up on a stationary 2-4 inch high object to help maintain the best posture. When both feet are on the ground, the lower back tends to lose its slight inward curve. If this curve flattens (or becomes too large), then the back and your other joints will experience too much stress, tire more quickly, and can cause pain. CORRECT STANDING POSTURES Proper standing posture should be assumed with all daily activities, even if they only take a few moments, like when brushing your teeth. As in sitting, your ears should fall over your shoulders and your shoulders should fall over your hips. You should keep a slight tension in your abdominal muscles to brace your spine. Your tailbone should point down to the ground, not behind your body,  resulting in an over-extended swayback posture.  INCORRECT STANDING POSTURES  Common incorrect standing postures include a forward head, locked knees and/or an excessive swayback. WALKING Walk with an upright posture. Your ears, shoulders and hips should all line-up. PROLONGED ACTIVITY IN A FLEXED POSITION When completing a task that requires you to bend forward at your waist or lean over a low surface, try to find a way to stabilize 3 out of 4 of your limbs. You can place a hand or elbow on your thigh or rest a knee on the surface you are reaching across. This will provide you more stability, so that your muscles do not tire as quickly. By keeping your knees relaxed, or slightly bent, you will also reduce stress across your lower back. CORRECT LIFTING TECHNIQUES DO :  Assume a wide stance. This will provide you more stability and the opportunity to get as close as possible to the object which you are lifting.  Tense your abdominals to brace your spine. Bend at the knees and hips. Keeping your back locked in a neutral-spine position, lift using your leg muscles. Lift with your legs, keeping your back straight.  Test the weight of unknown objects before attempting to lift them.  Try to keep your elbows locked down at your sides in order get the best strength from your shoulders when carrying an object.  Always ask for help when lifting heavy or awkward objects. INCORRECT LIFTING TECHNIQUES DO NOT:   Lock your knees when lifting, even if it is a small object.  Bend and twist. Pivot at your feet or move your feet when needing to change directions.  Assume that you can safely pick up even a paperclip without proper posture. Document Released: 08/12/2005 Document Revised: 11/04/2011 Document Reviewed: 11/24/2008 [facility] Patient Information 2015 [place], [place]. This information is not intended to replace advice given to you by your health care  provider. Make sure you discuss any questions you  have with your health care provider.

## 2014-06-12 NOTE — Progress Notes (Signed)
Subjective:    Patient ID: Jerry Chapman, male    DOB: [birth year], 63 y.o.   MRN: [id number]  This chart was scribed for Wardell Honour, MD by Edison Simon, ED Scribe. This patient was seen in room 10 and the patient's care was started at 12:15 PM.   06/12/2014  Back Pain   Back Pain Pertinent negatives include no dysuria, numbness or weakness.   HPI Comments: Jerry Chapman is a 63 y.o. male with history of prostate cancer who presents to the [facility] complaining of left-sided lower back pain with onset 1 week ago upon getting out of bed, rated at 10/10. He denies radiation of pain to his legs. He denies numbness, tingling, or weakness in his legs. He denies urinary or bowel abnormalities, including hematuria and frequency. He reports a prior kidney infection with which he experienced back pain without urinary symptoms. He reports increased pain upon standing though he is able to ambulate with a cane without significant difficulty. He states he normally does not use a cane but has one due to history of gout. He states that he is having some pain while sitting today, but prior to today he did not have any pain while sitting or laying down. He reports using Ibuprofen, Naproxen, Aleve, and BC powder without remission of symptoms. He states he has not used heat or ice. 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   Review of Systems  Gastrointestinal: Negative for diarrhea and constipation.  Genitourinary: Negative for dysuria, frequency, hematuria and difficulty urinating.  Musculoskeletal: Positive for back pain.  Neurological: Negative for weakness and numbness.       Denies incontinence    Past Medical History  Diagnosis Date  . Hypertension   . History of prostate cancer     S/P RADIOACTIVE SEED IMPLANTS--  no recurrence  . Phimosis   . Arthritis, gouty    Past Surgical History  Procedure Laterality Date  . Prostate biopsy       + adencarcinoma  .  Radioactive prostate seed implants  2004  . Circumcision N/A 07/05/2013    Procedure: CIRCUMCISION ADULT, aspiration of right hydrocele, meatal dilitation;  Surgeon: Claybon Jabs, MD;  Location: [facility];  Service: Urology;  Laterality: N/A;   No Known Allergies Current Outpatient Prescriptions  Medication Sig Dispense Refill  . allopurinol (ZYLOPRIM) 300 MG tablet Take 1 tablet (300 mg total) by mouth daily.  90 tablet  3  . colchicine (COLCRYS) 0.6 MG tablet TAKE 1 TABLET EVERY DAY  30 tablet  6  . triamterene-hydrochlorothiazide (MAXZIDE) 75-50 MG per tablet Take 1 tablet by mouth daily.  90 tablet  3   No current facility-administered medications for this visit.       Objective:    BP 120/78  Pulse 64  Temp(Src) 98.5 F (36.9 C) (Oral)  Resp 12  Ht 5\' 9"  (1.753 m)  Wt 217 lb (98.431 kg)  BMI 32.03 kg/m2  SpO2 99% Physical Exam  Nursing note and vitals reviewed. Constitutional: He is oriented to person, place, and time. He appears well-developed and well-nourished. No distress.  HENT:  Head: Normocephalic and atraumatic.  Eyes: Conjunctivae are normal.  Neck: Normal range of motion. Neck supple.  Pulmonary/Chest: Effort normal.  Genitourinary:  No CVA tenderness  Musculoskeletal:       Lumbar back: He exhibits decreased range of motion, pain and spasm. He exhibits no tenderness and no bony tenderness.  Straight leg raises positive. marching intact. toe and heel walking intact. strength 5/5 in lower extremities  Neurological: He is alert and oriented to person, place, and time. He has normal strength and normal reflexes. No cranial nerve deficit or sensory deficit. Coordination normal.  Skin: Skin is warm and dry. He is not diaphoretic.  Psychiatric: He has a normal mood and affect.   Results for orders placed in visit on 06/12/14  POCT URINALYSIS DIPSTICK      Result Value Ref Range   Color, UA yellow     Clarity, UA clear     Glucose, UA neg       Bilirubin, UA neg     Ketones, UA neg     Spec Grav, UA 1.020     Blood, UA neg     pH, UA 5.5     Protein, UA neg     Urobilinogen, UA 0.2     Nitrite, UA neg     Leukocytes, UA Negative    POCT UA - MICROSCOPIC ONLY      Result Value Ref Range   WBC, Ur, HPF, POC neg     RBC, urine, microscopic neg     Bacteria, U Microscopic 0-1     Mucus, UA neg     Epithelial cells, urine per micros neg     Crystals, Ur, HPF, POC neg     Casts, Ur, LPF, POC neg     Yeast, UA neg     [facility] reading (PRIMARY) by  Dr. Tamala Julian.  LUMBAR SPINE: MILD DEGENERATIVE CHANGES L5-S1.  TORADOL 60MG  IM ADMINISTERED.    Assessment & Plan:   1. Low back pain without sciatica, unspecified back pain laterality    1. Low back pain:  New.  Has remaining medication left over from last year; recommend restarting Robaxin and Hydrocodone for pain; home exercise program provided to perform daily; recommend heat to area bid for 15-20 minutes.  Call in one month if no improvement with pain.   Meds ordered this encounter  Medications  . ketorolac (TORADOL) injection 60 mg    Sig:     No Follow-up on file.  I personally performed the services described in this documentation, which was scribed in my presence.  The recorded information has been reviewed and is accurate.  Reginia Forts, M.D.  [facility] [address] [place],   [id number] [phone] phone [phone] fax

## 2014-06-14 LAB — URINE CULTURE
COLONY COUNT: NO GROWTH
ORGANISM ID, BACTERIA: NO GROWTH

## 2014-11-07 ENCOUNTER — Ambulatory Visit (INDEPENDENT_AMBULATORY_CARE_PROVIDER_SITE_OTHER): Payer: Self-pay | Admitting: Physician Assistant

## 2014-11-07 VITALS — BP 110/68 | HR 66 | Temp 98.9°F | Resp 17 | Ht 69.5 in | Wt 224.6 lb

## 2014-11-07 DIAGNOSIS — M1000 Idiopathic gout, unspecified site: Secondary | ICD-10-CM

## 2014-11-07 DIAGNOSIS — I10 Essential (primary) hypertension: Secondary | ICD-10-CM

## 2014-11-07 LAB — COMPLETE METABOLIC PANEL WITH GFR
ALBUMIN: 4.1 g/dL (ref 3.5–5.2)
ALT: 21 U/L (ref 0–53)
AST: 26 U/L (ref 0–37)
Alkaline Phosphatase: 59 U/L (ref 39–117)
BUN: 24 mg/dL — ABNORMAL HIGH (ref 6–23)
CO2: 27 mEq/L (ref 19–32)
CREATININE: 1.19 mg/dL (ref 0.50–1.35)
Calcium: 9.4 mg/dL (ref 8.4–10.5)
Chloride: 102 mEq/L (ref 96–112)
GFR, Est African American: 74 mL/min
GFR, Est Non African American: 64 mL/min
Glucose, Bld: 90 mg/dL (ref 70–99)
POTASSIUM: 4.6 meq/L (ref 3.5–5.3)
Sodium: 139 mEq/L (ref 135–145)
Total Bilirubin: 0.5 mg/dL (ref 0.2–1.2)
Total Protein: 7.4 g/dL (ref 6.0–8.3)

## 2014-11-07 LAB — URIC ACID: Uric Acid, Serum: 10 mg/dL — ABNORMAL HIGH (ref 4.0–7.8)

## 2014-11-07 LAB — GLUCOSE, POCT (MANUAL RESULT ENTRY): POC Glucose: 102 mg/dl — AB (ref 70–99)

## 2014-11-07 MED ORDER — PREDNISONE 10 MG PO TABS: 10.0000 mg | ORAL_TABLET | Freq: Every day | ORAL | Status: DC

## 2014-11-07 NOTE — Progress Notes (Signed)
[facility] [address] [place] [address] [phone]  Date:  11/07/2014   Name:  Jerry Chapman   DOB:  1951-01-24   MRN:  [id number]  PCP:  Kennon Portela, MD    Chief Complaint: Gout   History of Present Illness:  Jerry Chapman is a 64 y.o. very pleasant male patient who presents with the following:  Patient reports pain at right wrist, left hand, and left foot.  This began about a week ago at the right hand and has not resolved.  He reports swelling at sites.  He is able to apply weight to the left foot, but there is mild pain.  Both hands have pain with gripping.  Right wrist pain is aggravated with moving the wrist as well.  He denies fever, erythema, or trauma to these sites.  There is no malaise, n/v, or sob.  He denies rashing to either site.   Patient states that he quit his allopurinol when the flare up started.  He attempted to use extra colchicine within the first start of symptoms and and a second tablet after an hour, but this did not help to resolve the symptoms.  He states that he has had some shellfish within the last week. The diuretic is used daily.  He has taken maxzide without complication to gouty flare ups.     Patient Active Problem List   Diagnosis Date Noted  . Acquired phimosis 07/05/2013  . HTN (hypertension) 05/09/2013  . Gout 05/09/2013  . Renal insufficiency 05/09/2013    Past Medical History  Diagnosis Date  . Hypertension   . History of prostate cancer     S/P RADIOACTIVE SEED IMPLANTS--  no recurrence  . Phimosis   . Arthritis, gouty     Past Surgical History  Procedure Laterality Date  . Prostate biopsy       + adencarcinoma  . Radioactive prostate seed implants  2004  . Circumcision N/A 07/05/2013    Procedure: CIRCUMCISION ADULT, aspiration of right hydrocele, meatal dilitation;  Surgeon: Claybon Jabs, MD;  Location: [facility];  Service: Urology;  Laterality: N/A;     History  Substance Use Topics  . Smoking status: Never Smoker   . Smokeless tobacco: Never Used  . Alcohol Use: 1.0 oz/week    2 drink(s) per week    Family History  Problem Relation Age of Onset  . Colon cancer Neg Hx   . Pancreatic cancer Neg Hx   . Stomach cancer Neg Hx   . Esophageal cancer Neg Hx     No Known Allergies  Medication list has been reviewed and updated.  Current Outpatient Prescriptions on File Prior to Visit  Medication Sig Dispense Refill  . allopurinol (ZYLOPRIM) 300 MG tablet Take 1 tablet (300 mg total) by mouth daily. 90 tablet 3  . colchicine (COLCRYS) 0.6 MG tablet TAKE 1 TABLET EVERY DAY 30 tablet 6  . triamterene-hydrochlorothiazide (MAXZIDE) 75-50 MG per tablet Take 1 tablet by mouth daily. 90 tablet 3   No current facility-administered medications on file prior to visit.    Review of Systems: ROS otherwise unremarkable unless listed above.  Physical Examination: Filed Vitals:   11/07/14 0914  BP: 110/68  Pulse: 66  Temp: 98.9 F (37.2 C)  Resp: 17   Filed Vitals:   11/07/14 0914  Height: 5' 9.5" (1.765 m)  Weight: 224 lb 9.6 oz (101.878 kg)   Body mass index  is 32.7 kg/(m^2). Ideal Body Weight: Weight in (lb) to have BMI = 25: 171.4  Physical Exam  Constitutional: He is oriented to person, place, and time. He appears well-developed and well-nourished. No distress.  HENT:  Head: Normocephalic and atraumatic.  Cardiovascular: Normal rate.   Musculoskeletal:  Swelling without erythema at the right wrist.  No tenderness with palpation.  Good ROM through right wrist.  Decreased grip strength 3/5.  Normal raidal pulse.  Left 1st mcp and thenar with mild swelling without erythema.  4/5 grip strength.  Good ROM.  Foot just anterior along dorsum of lateral malleolus with tenderness without erythema.  Good ROM and strength at ankle.  Some tenderness with passive inversion and eversion, and plantar flexion.  Normal distal pulses.   Neurological: He is alert and oriented to person, place, and time.  Skin: Skin is warm and dry. He is not diaphoretic.  Psychiatric: He has a normal mood and affect. His behavior is normal.     Results for orders placed or performed in visit on 11/07/14  POCT glucose (manual entry)  Result Value Ref Range   POC Glucose 102 (A) 70 - 99 mg/dl     Assessment and Plan: 64 year old male with a PMH of gout, HTN, and prostate cancer, is here today for right wrist, left hand, and left foot pain.  Essential hypertension - Plan: COMPLETE METABOLIC PANEL WITH GFR -Continue the maxzide. BP optimal with this diuretic and are not causing frequent gouty flares.  Benefits outweigh.    Idiopathic gout, unspecified chronicity, unspecified site - Plan: POCT glucose (manual entry), Uric Acid, predniSONE (DELTASONE) 10 MG tablet -Prescribed prednisone 6 day taper to cut inflammation.  Advised to be mindful of diet, though no hx of DM.   -Advised to continue taking regular dosed allopurinol throughout the attacks. -Advised continuance of colchicine. -Order CMETGFR to observe kidney function at this time.  Discussed plan and PE performed by Dr. Elder Cyphers as well [name], PA-C [facility] 3/15/201610:01 PM

## 2014-12-04 ENCOUNTER — Ambulatory Visit (INDEPENDENT_AMBULATORY_CARE_PROVIDER_SITE_OTHER): Payer: Self-pay | Admitting: Family Medicine

## 2014-12-04 VITALS — BP 140/80 | HR 82 | Temp 98.3°F | Resp 18 | Ht 70.0 in | Wt 221.0 lb

## 2014-12-04 DIAGNOSIS — R040 Epistaxis: Secondary | ICD-10-CM

## 2014-12-04 DIAGNOSIS — J029 Acute pharyngitis, unspecified: Secondary | ICD-10-CM

## 2014-12-04 DIAGNOSIS — H6121 Impacted cerumen, right ear: Secondary | ICD-10-CM

## 2014-12-04 DIAGNOSIS — J0101 Acute recurrent maxillary sinusitis: Secondary | ICD-10-CM

## 2014-12-04 MED ORDER — AMOXICILLIN 875 MG PO TABS: 875.0000 mg | ORAL_TABLET | Freq: Two times a day (BID) | ORAL | Status: DC

## 2014-12-04 NOTE — Progress Notes (Addendum)
° °  Subjective:  This chart was scribed for Robyn Haber, MD, by Starleen Arms, Medical Scribe. This patient was seen in room Rm 13 and the patient's care was started at 1:36 PM.   Patient ID: Jerry Chapman, male    DOB: 05-22-1951, 64 y.o.   MRN: [id number]  HPI HPI Comments: Jerry Chapman is a 64 y.o. male who presents to [facility] complaining of nasal congestion with associated sore throat, generalized body aches, and generalized weakness onset 3 days ago.   He reports his nasal mucous has had some bloody content.  Patient is not a smoker.  NKA.    Patient is a Nature conservation officer .  Review of Systems No fever    Objective:   Physical Exam Obese and legs man in no acute distress Oropharynx: Mild erythema posteriorly TMs: Obscured on the left because of cerumen Nose: A cup. On discharge bilaterally with fresh blood in the right nasal septum Neck: Supple no adenopathy Chest: Few faint wheezes otherwise negative Heart: Regular no murmur Skin: No significant rash seen Extremities: No edema Cerumen lavaged clear Assessment & Plan:  1:44 PM Discussed treatment plan with patient at bedside.  Patient acknowledges and agrees with plan.    This chart was scribed in my presence and reviewed by me personally.    ICD-9-CM ICD-10-CM   1. Recurrent maxillary sinusitis, unspecified chronicity 461.0 J01.01   2. Epistaxis 784.7 R04.0   3. Sore throat 462 J02.9   4. Cerumen impaction, right 380.4 H61.21      Signed, Robyn Haber, MD

## 2014-12-04 NOTE — Patient Instructions (Addendum)
Pickup some over-the-counter Afrin nasal spray (oxymetazoline) and use twice a day for the next 2-3 days. Recommend you see Dr. Delman Cheadle for your primary care doctor   Sinusitis Sinusitis is redness, soreness, and inflammation of the paranasal sinuses. Paranasal sinuses are air pockets within the bones of your face (beneath the eyes, the middle of the forehead, or above the eyes). In healthy paranasal sinuses, mucus is able to drain out, and air is able to circulate through them by way of your nose. However, when your paranasal sinuses are inflamed, mucus and air can become trapped. This can allow bacteria and other germs to grow and cause infection. Sinusitis can develop quickly and last only a short time (acute) or continue over a long period (chronic). Sinusitis that lasts for more than 12 weeks is considered chronic.  CAUSES  Causes of sinusitis include:  Allergies.  Structural abnormalities, such as displacement of the cartilage that separates your nostrils (deviated septum), which can decrease the air flow through your nose and sinuses and affect sinus drainage.  Functional abnormalities, such as when the small hairs (cilia) that line your sinuses and help remove mucus do not work properly or are not present. SIGNS AND SYMPTOMS  Symptoms of acute and chronic sinusitis are the same. The primary symptoms are pain and pressure around the affected sinuses. Other symptoms include:  Upper toothache.  Earache.  Headache.  Bad breath.  Decreased sense of smell and taste.  A cough, which worsens when you are lying flat.  Fatigue.  Fever.  Thick drainage from your nose, which often is green and may contain pus (purulent).  Swelling and warmth over the affected sinuses. DIAGNOSIS  Your health care provider will perform a physical exam. During the exam, your health care provider may:  Look in your nose for signs of abnormal growths in your nostrils (nasal polyps).  Tap over the  affected sinus to check for signs of infection.  View the inside of your sinuses (endoscopy) using an imaging device that has a light attached (endoscope). If your health care provider suspects that you have chronic sinusitis, one or more of the following tests may be recommended:  Allergy tests.  Nasal culture. A sample of mucus is taken from your nose, sent to a lab, and screened for bacteria.  Nasal cytology. A sample of mucus is taken from your nose and examined by your health care provider to determine if your sinusitis is related to an allergy. TREATMENT  Most cases of acute sinusitis are related to a viral infection and will resolve on their own within 10 days. Sometimes medicines are prescribed to help relieve symptoms (pain medicine, decongestants, nasal steroid sprays, or saline sprays).  However, for sinusitis related to a bacterial infection, your health care provider will prescribe antibiotic medicines. These are medicines that will help kill the bacteria causing the infection.  Rarely, sinusitis is caused by a fungal infection. In theses cases, your health care provider will prescribe antifungal medicine. For some cases of chronic sinusitis, surgery is needed. Generally, these are cases in which sinusitis recurs more than 3 times per year, despite other treatments. HOME CARE INSTRUCTIONS   Drink plenty of water. Water helps thin the mucus so your sinuses can drain more easily.  Use a humidifier.  Inhale steam 3 to 4 times a day (for example, sit in the bathroom with the shower running).  Apply a warm, moist washcloth to your face 3 to 4 times a day, or as  directed by your health care provider.  Use saline nasal sprays to help moisten and clean your sinuses.  Take medicines only as directed by your health care provider.  If you were prescribed either an antibiotic or antifungal medicine, finish it all even if you start to feel better. SEEK IMMEDIATE MEDICAL CARE IF:  You  have increasing pain or severe headaches.  You have nausea, vomiting, or drowsiness.  You have swelling around your face.  You have vision problems.  You have a stiff neck.  You have difficulty breathing. MAKE SURE YOU:   Understand these instructions.  Will watch your condition.  Will get help right away if you are not doing well or get worse. Document Released: 08/12/2005 Document Revised: 12/27/2013 Document Reviewed: 08/27/2011 [facility] Patient Information 2015 [place], [place]. This information is not intended to replace advice given to you by your health care provider. Make sure you discuss any questions you have with your health care provider.

## 2014-12-14 ENCOUNTER — Other Ambulatory Visit: Payer: Self-pay | Admitting: Family Medicine

## 2015-01-01 ENCOUNTER — Ambulatory Visit (INDEPENDENT_AMBULATORY_CARE_PROVIDER_SITE_OTHER): Payer: Self-pay | Admitting: Family Medicine

## 2015-01-01 VITALS — BP 142/90 | HR 85 | Temp 98.0°F | Resp 16 | Ht 70.0 in | Wt 221.0 lb

## 2015-01-01 DIAGNOSIS — M10221 Drug-induced gout, right elbow: Secondary | ICD-10-CM

## 2015-01-01 DIAGNOSIS — I10 Essential (primary) hypertension: Secondary | ICD-10-CM

## 2015-01-01 MED ORDER — PREDNISONE 20 MG PO TABS: ORAL_TABLET | ORAL | Status: DC

## 2015-01-01 MED ORDER — AMLODIPINE BESYLATE 5 MG PO TABS: 5.0000 mg | ORAL_TABLET | Freq: Every day | ORAL | Status: DC

## 2015-01-01 NOTE — Patient Instructions (Signed)

## 2015-01-01 NOTE — Progress Notes (Signed)
Subjective:    Patient ID: Jerry Chapman, male    DOB: 07-04-1951, 64 y.o.   MRN: [id number] This chart was scribed for Robyn Haber, MD by Zola Button, Medical Scribe. This patient was seen in Room 10 and the patient's care was started at 1:34 PM.   HPI HPI Comments: Jerry Chapman is a 64 y.o. male with a hx of gout and HTN who presents to the [facility] complaining of a flare-up of gout that started 2 months ago. The pain was initially in his right wrist, but is now in his right elbow. His pain has worsened over the past few weeks. The pain is worse with movement, but is not worse with palpation. He has been taking BC powder for the past 2 weeks with temporary relief that lasts about 4 hours. Patient was placed on a course of prednisone after his last visit here with [name], PA-C on 3/14.  Patient works in [employer].  Note from 11/07/14 with [name], PA-C: Jerry Chapman is a 64 y.o. very pleasant male patient who presents with the following: Patient reports pain at right wrist, left hand, and left foot. This began about a week ago at the right hand and has not resolved. He reports swelling at sites. He is able to apply weight to the left foot, but there is mild pain. Both hands have pain with gripping. Right wrist pain is aggravated with moving the wrist as well. He denies fever, erythema, or trauma to these sites. There is no malaise, n/v, or sob. He denies rashing to either site.  Patient states that he quit his allopurinol when the flare up started. He attempted to use extra colchicine within the first start of symptoms and and a second tablet after an hour, but this did not help to resolve the symptoms. He states that he has had some shellfish within the last week. The diuretic is used daily. He has taken maxzide without complication to gouty flare ups.  Review of Systems  Musculoskeletal: Positive for arthralgias.        Objective:   Physical Exam CONSTITUTIONAL: Well developed/well nourished HEAD: Normocephalic/atraumatic EYES: EOM/PERRL ENMT: Mucous membranes moist NECK: supple no meningeal signs SPINE: entire spine nontender CV: S1/S2 noted, no murmurs/rubs/gallops noted LUNGS: Lungs are clear to auscultation bilaterally, no apparent distress ABDOMEN: soft, nontender, no rebound or guarding GU: no cva tenderness NEURO: Pt is awake/alert, moves all extremitiesx4 EXTREMITIES: pulses normal, full ROM SKIN: warm, color normal PSYCH: no abnormalities of mood noted  Results for orders placed or performed in visit on 11/07/14  COMPLETE METABOLIC PANEL WITH GFR  Result Value Ref Range   Sodium 139 135 - 145 mEq/L   Potassium 4.6 3.5 - 5.3 mEq/L   Chloride 102 96 - 112 mEq/L   CO2 27 19 - 32 mEq/L   Glucose, Bld 90 70 - 99 mg/dL   BUN 24 (H) 6 - 23 mg/dL   Creat 1.19 0.50 - 1.35 mg/dL   Total Bilirubin 0.5 0.2 - 1.2 mg/dL   Alkaline Phosphatase 59 39 - 117 U/L   AST 26 0 - 37 U/L   ALT 21 0 - 53 U/L   Total Protein 7.4 6.0 - 8.3 g/dL   Albumin 4.1 3.5 - 5.2 g/dL   Calcium 9.4 8.4 - 10.5 mg/dL   GFR, Est African American 74 mL/min   GFR, Est Non African American 64 mL/min  Uric Acid  Result Value  Ref Range   Uric Acid, Serum 10.0 (H) 4.0 - 7.8 mg/dL  POCT glucose (manual entry)  Result Value Ref Range   POC Glucose 102 (A) 70 - 99 mg/dl         Assessment & Plan:   This chart was scribed in my presence and reviewed by me personally.    ICD-9-CM ICD-10-CM   1. Essential hypertension 401.9 I10   2. Acute drug-induced gout of right elbow 274.9 M10.221    I'm concerned that the Maxide is contributing to his recurring flareups of gout  Signed, Robyn Haber, MD

## 2015-01-20 ENCOUNTER — Ambulatory Visit (INDEPENDENT_AMBULATORY_CARE_PROVIDER_SITE_OTHER): Payer: Self-pay | Admitting: Family Medicine

## 2015-01-20 VITALS — BP 156/82 | HR 79 | Temp 99.2°F | Resp 16 | Ht 69.0 in | Wt 227.0 lb

## 2015-01-20 DIAGNOSIS — M1000 Idiopathic gout, unspecified site: Secondary | ICD-10-CM

## 2015-01-20 DIAGNOSIS — B354 Tinea corporis: Secondary | ICD-10-CM

## 2015-01-20 MED ORDER — KETOCONAZOLE 200 MG PO TABS: 200.0000 mg | ORAL_TABLET | Freq: Every day | ORAL | Status: DC

## 2015-01-20 MED ORDER — PREDNISONE 20 MG PO TABS: ORAL_TABLET | ORAL | Status: DC

## 2015-01-20 NOTE — Progress Notes (Signed)
Subjective:  This chart was scribed for Robyn Haber MD, by Tamsen Roers, at [facility].  This patient was seen in room 8 and the patient's care was started at 2:04 PM.    Patient ID: Jerry Chapman, male    DOB: 04/30/1951, 64 y.o.   MRN: [id number] Chief Complaint  Patient presents with   Rash    shoulder and back   Gout    HPI  HPI Comments: Jerry Chapman is a 64 y.o. male who presents to the [facility] complaining gout in his right hand, and right knee onset a couple days ago.  Patient has associated symptoms of swelling and redness in his hand.  He has a history of gout but had not been having trouble with his knee until recently.   He is still taking the allopurinol and prednisone.  He is also complaining of a rash on his right back and left shoulder region which he has had in the past. He has no other complaints today.   He's gouty attacks are becoming recurrent and each occurrence is getting closer together. He wonders if this is caused by stress. His job is getting more more stressful.  Today he has swelling and pain in his right elbow, right knee, and right wrist.  Patient is a Chief Strategy Officer   Past Medical History  Diagnosis Date   Hypertension    History of prostate cancer     S/P RADIOACTIVE SEED IMPLANTS--  no recurrence   Phimosis    Arthritis, gouty     Current Outpatient Prescriptions on File Prior to Visit  Medication Sig Dispense Refill   allopurinol (ZYLOPRIM) 300 MG tablet Take 1 tablet (300 mg total) by mouth daily. 90 tablet 3   amLODipine (NORVASC) 5 MG tablet Take 1 tablet (5 mg total) by mouth daily. 90 tablet 3   colchicine 0.6 MG tablet TAKE 1 TABLET BY MOUTH EVERY DAY 30 tablet 4   predniSONE (DELTASONE) 20 MG tablet 3-3-2-2-2-1-1-1-1/2 for another week (daily with food) (Patient not taking: Reported on 01/20/2015) 17 tablet 0   No current facility-administered medications on file  prior to visit.    No Known Allergies      Review of Systems  Constitutional: Negative for fever and chills.  HENT: Negative for congestion.   Respiratory: Negative for cough and shortness of breath.   Gastrointestinal: Negative for nausea and vomiting.  Musculoskeletal: Positive for arthralgias. Negative for neck pain and neck stiffness.  Skin: Positive for rash.       Objective:   Physical Exam  Constitutional: He is oriented to person, place, and time. He appears well-developed and well-nourished. No distress.  HENT:  Head: Normocephalic and atraumatic.  Eyes: Conjunctivae and EOM are normal.  Cardiovascular: Normal rate.   Pulmonary/Chest: Effort normal. No respiratory distress.  Musculoskeletal: Normal range of motion.  Tender, swollen, and erythematous right lateral condyle of the elbow, right lateral wrist and hand. He also has swelling and pain in his right knee without tenderness. There is no erythema nor effusion on the knee.  Neurological: He is alert and oriented to person, place, and time.  Skin: Skin is warm and dry.  Patient has a confluent erythematous rash with a raised border on his right scapula, left neck areas.  Psychiatric: He has a normal mood and affect. His behavior is normal.  Nursing note and vitals reviewed.   Filed Vitals:   01/20/15 1358  BP:  156/82  Pulse: 79  Temp: 99.2 F (37.3 C)  Resp: 16  Height: 5\' 9"  (1.753 m)  Weight: 227 lb (102.967 kg)  SpO2: 97%         Assessment & Plan:   This chart was scribed in my presence and reviewed by me personally.    ICD-9-CM ICD-10-CM   1. Acute idiopathic gout, unspecified site 274.01 M10.00 predniSONE (DELTASONE) 20 MG tablet  2. Tinea corporis 110.5 B35.4 ketoconazole (NIZORAL) 200 MG tablet   We may need a rheumatology consult in the future.  Signed, Robyn Haber, MD

## 2015-01-20 NOTE — Patient Instructions (Signed)

## 2015-05-24 ENCOUNTER — Other Ambulatory Visit: Payer: Self-pay | Admitting: Physician Assistant

## 2015-06-29 ENCOUNTER — Other Ambulatory Visit: Payer: Self-pay | Admitting: Physician Assistant

## 2015-06-29 MED ORDER — ALLOPURINOL 300 MG PO TABS: 300.0000 mg | ORAL_TABLET | Freq: Every day | ORAL | Status: DC

## 2015-07-16 ENCOUNTER — Ambulatory Visit (INDEPENDENT_AMBULATORY_CARE_PROVIDER_SITE_OTHER): Payer: Medicare HMO | Admitting: Physician Assistant

## 2015-07-16 VITALS — BP 138/90 | HR 73 | Temp 98.5°F | Resp 18 | Ht 69.0 in | Wt 235.4 lb

## 2015-07-16 DIAGNOSIS — Z114 Encounter for screening for human immunodeficiency virus [HIV]: Secondary | ICD-10-CM | POA: Diagnosis not present

## 2015-07-16 DIAGNOSIS — C61 Malignant neoplasm of prostate: Secondary | ICD-10-CM | POA: Insufficient documentation

## 2015-07-16 DIAGNOSIS — M1000 Idiopathic gout, unspecified site: Secondary | ICD-10-CM

## 2015-07-16 DIAGNOSIS — Z23 Encounter for immunization: Secondary | ICD-10-CM

## 2015-07-16 DIAGNOSIS — Z1159 Encounter for screening for other viral diseases: Secondary | ICD-10-CM | POA: Diagnosis not present

## 2015-07-16 DIAGNOSIS — I10 Essential (primary) hypertension: Secondary | ICD-10-CM

## 2015-07-16 DIAGNOSIS — B354 Tinea corporis: Secondary | ICD-10-CM | POA: Diagnosis not present

## 2015-07-16 DIAGNOSIS — J019 Acute sinusitis, unspecified: Secondary | ICD-10-CM

## 2015-07-16 MED ORDER — KETOCONAZOLE 200 MG PO TABS: 200.0000 mg | ORAL_TABLET | Freq: Every day | ORAL | Status: DC

## 2015-07-16 MED ORDER — ALLOPURINOL 300 MG PO TABS
300.0000 mg | ORAL_TABLET | Freq: Every day | ORAL | Status: DC
Start: 2015-07-16 — End: 2016-05-09

## 2015-07-16 MED ORDER — IPRATROPIUM BROMIDE 0.03 % NA SOLN: 2.0000 | Freq: Two times a day (BID) | NASAL | Status: DC

## 2015-07-16 MED ORDER — ZOSTER VACCINE LIVE 19400 UNT/0.65ML ~~LOC~~ SOLR: 0.6500 mL | Freq: Once | SUBCUTANEOUS | Status: DC

## 2015-07-16 MED ORDER — COLCHICINE 0.6 MG PO TABS: 0.6000 mg | ORAL_TABLET | Freq: Every day | ORAL | Status: DC

## 2015-07-16 MED ORDER — AMOXICILLIN-POT CLAVULANATE 875-125 MG PO TABS: 1.0000 | ORAL_TABLET | Freq: Two times a day (BID) | ORAL | Status: AC

## 2015-07-16 NOTE — Progress Notes (Signed)
Subjective:    Patient ID: Jerry Chapman, male    DOB: 05-15-1951, 64 y.o.   MRN: [id number]  Chief Complaint  Patient presents with  . Sinus Problem    C/O hoarseness, cougging up mucous, nasal congestion, & blowing out colored mucous, headache  since Thurs.  . Flu Vaccine  . Rash    on right shoulder   HPI Patient presents today for evaluation of sinus pain and congestion x 4 days.   Pain is predominantly in his maxillary sinuses. Associated with generalized body aches, fatigue, and headache. Also having thick, yellow nasal drainage, productive cough, and post-nasal drip. Symptoms have been stable over the last couple of days. Denies fevers, chills, N/V, ear pain or hearing loss, SOB, or wheezing.   Has tried Mucinex and other OTC cold and sinus medications with only temporary relief.   Patient requesting a refill of his ketoconazole, which he took for tinea corporis of the right shoulder back in May. Rash went away, but returned 3 months later. Now having rash on b/l shoulders.   Patient also requesting refills of his allopurinol and colchicine, which he takes for gout. He has not had any flare-ups in 6 months. Has a PMH of HTN and renal insufficiency, will recheck kidney function today.  Patient due for his annual flu, pneumococcal (PCV-13), Tdap, and shingles vaccinations today, as well as routine Hep C and HIV screening.   Review of Systems  Constitutional: Positive for fatigue. Negative for fever, chills, activity change and appetite change.  HENT: Positive for congestion, postnasal drip, rhinorrhea (thick, yellow nasal drainage) and sinus pressure. Negative for ear pain, hearing loss and tinnitus.   Respiratory: Positive for cough. Negative for shortness of breath and wheezing.   Cardiovascular: Negative for chest pain.  Gastrointestinal: Negative for nausea, vomiting and abdominal pain.  Skin: Positive for rash (tinea corporis on b/l shoulders).  Neurological: Positive  for headaches. Negative for dizziness, weakness and light-headedness.   Patient Active Problem List   Diagnosis Date Noted  . Acquired phimosis 07/05/2013  . HTN (hypertension) 05/09/2013  . Gout 05/09/2013  . Renal insufficiency 05/09/2013   Family History  Problem Relation Age of Onset  . Colon cancer Neg Hx   . Pancreatic cancer Neg Hx   . Stomach cancer Neg Hx   . Esophageal cancer Neg Hx    Social History   Social History  . Marital Status: Divorced    Spouse Name: N/A  . Number of Children: N/A  . Years of Education: N/A   Occupational History  . Not on file.   Social History Main Topics  . Smoking status: Never Smoker   . Smokeless tobacco: Never Used  . Alcohol Use: 1.0 oz/week    2 drink(s) per week  . Drug Use: No  . Sexual Activity: Not on file   Other Topics Concern  . 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    Prior to Admission medications   Medication Sig Start Date End Date Taking? Authorizing Provider  allopurinol (ZYLOPRIM) 300 MG tablet Take 1 tablet (300 mg total) by mouth daily. PATIENT NEEDS OFFICE VISIT FOR ADDITIONAL REFILLS 06/29/15   Robyn Haber, MD  amLODipine (NORVASC) 5 MG tablet Take 1 tablet (5 mg total) by mouth daily. 01/01/15   Robyn Haber, MD  colchicine 0.6 MG tablet Take 1 tablet (0.6 mg total) by mouth daily. PATIENT NEEDS OFFICE VISIT FOR  ADDITIONAL REFILLS 06/29/15   Robyn Haber, MD  ketoconazole (NIZORAL) 200 MG tablet Take 1 tablet (200 mg total) by mouth daily. 01/20/15   Robyn Haber, MD  predniSONE (DELTASONE) 20 MG tablet 3-3-2-2--1-1-1/2 for another week (daily with food) 01/20/15   Robyn Haber, MD   No Known Allergies    Objective:   Physical Exam  Constitutional: He is oriented to person, place, and time. He appears well-developed and well-nourished. No distress.  HENT:  Head: Normocephalic and atraumatic.  Right Ear: External ear normal.  Left Ear:  External ear normal.  Nose: Nose normal.  Mouth/Throat: Oropharynx is clear and moist. No oropharyngeal exudate.  Eyes: EOM are normal. No scleral icterus.  Neck: Neck supple.  Cardiovascular: Normal rate, regular rhythm and normal heart sounds.  Exam reveals no gallop and no friction rub.   No murmur heard. 2+ DP and PT pulses b/l.   Pulmonary/Chest: Effort normal and breath sounds normal. No respiratory distress. He has no wheezes.  Lymphadenopathy:    He has no cervical adenopathy.  Neurological: He is alert and oriented to person, place, and time.  Skin: Skin is warm and dry. Rash noted. He is not diaphoretic.  Erythematous, scaly lesions on b/l shoulders, consistent with tinea corporis.   Psychiatric: He has a normal mood and affect. His behavior is normal. Judgment and thought content normal.   BP 138/90 mmHg  Pulse 73  Temp(Src) 98.5 F (36.9 C) (Oral)  Resp 18  Ht 5\' 9"  (1.753 m)  Wt 235 lb 6 oz (106.765 kg)  BMI 34.74 kg/m2  SpO2 98%     Assessment & Plan:  1. Subacute sinusitis, unspecified location - amoxicillin-clavulanate (AUGMENTIN) 875-125 MG tablet; Take 1 tablet by mouth 2 (two) times daily.  Dispense: 20 tablet; Refill: 0 - ipratropium (ATROVENT) 0.03 % nasal spray; Place 2 sprays into both nostrils 2 (two) times daily.  Dispense: 30 mL; Refill: 0  2. Acute idiopathic gout, unspecified site - Patient instructed to avoid taking colchicine with ketoconazole simultaneously to avoid colchicine toxicity.  - colchicine 0.6 MG tablet; Take 1 tablet (0.6 mg total) by mouth daily.  Dispense: 30 tablet; Refill: 1 - allopurinol (ZYLOPRIM) 300 MG tablet; Take 1 tablet (300 mg total) by mouth daily.  Dispense: 90 tablet; Refill: 3  3. Tinea corporis - Recommended OTC Selsun Blue shampoo with oral ketoconazole.   - ketoconazole (NIZORAL) 200 MG tablet; Take 1 tablet (200 mg total) by mouth daily.  Dispense: 7 tablet; Refill: 1  4. Essential hypertension - Awaiting lab  results. Continue amlodipine 5 mg daily as prescribed.  - CBC with Differential/Platelet - Comprehensive metabolic panel - TSH  5. Screening for HIV (human immunodeficiency virus) - HIV antibody  6. Need for hepatitis C screening test - Hepatitis C antibody  7. Need for shingles vaccine - zoster vaccine live, PF, (ZOSTAVAX) 13086 UNT/0.65ML injection; Inject 19,400 Units into the skin once.  Dispense: 1 each; Refill: 0

## 2015-07-16 NOTE — Progress Notes (Signed)
Patient ID: Jerry Chapman, male    DOB: August 23, 1951, 64 y.o.   MRN: [id number]  PCP: Kennon Portela, MD  Subjective:   Chief Complaint  Patient presents with  . Sinus Problem    C/O hoarseness, cougging up mucous, nasal congestion, & blowing out colored mucous, headache  since Thurs.  . Flu Vaccine  . Rash    on right shoulder    HPI Presents for evaluation of sinus pain and congestion x 4 days.   Pain is predominantly in his maxillary sinuses. Associated with generalized body aches, fatigue, and headache. Also having thick, yellow nasal drainage, productive cough, and post-nasal drip. Symptoms have been stable over the last couple of days. Denies fevers, chills, N/V, ear pain or hearing loss, SOB, or wheezing.   Has tried Mucinex and other OTC cold and sinus medications with only temporary relief.   Patient requesting a refill of his ketoconazole, which he took for tinea corporis of the right shoulder back in May. Rash went away, but returned 3 months later. Now having rash on b/l shoulders.   Patient also requesting refills of his allopurinol and colchicine, which he takes for gout. He has not had any flare-ups in 6 months.   Has a PMH of HTN and renal insufficiency, will recheck kidney function today.   Patient due for his annual flu, pneumococcal (PCV-13), Tdap, and shingles vaccinations today, as well as routine Hep C and HIV screening.  Review of Systems Constitutional: Positive for fatigue. Negative for fever, chills, activity change and appetite change.  HENT: Positive for congestion, postnasal drip, rhinorrhea (thick, yellow nasal drainage) and sinus pressure. Negative for ear pain, hearing loss and tinnitus.  Respiratory: Positive for cough. Negative for shortness of breath and wheezing.  Cardiovascular: Negative for chest pain.  Gastrointestinal: Negative for nausea, vomiting and abdominal pain.  Skin: Positive for rash (tinea corporis on b/l shoulders).    Neurological: Positive for headaches. Negative for dizziness, weakness and light-headedness.      Patient Active Problem List   Diagnosis Date Noted  . Acquired phimosis 07/05/2013  . HTN (hypertension) 05/09/2013  . Gout 05/09/2013  . Renal insufficiency 05/09/2013     Prior to Admission medications   Medication Sig Start Date End Date Taking? Authorizing Provider  allopurinol (ZYLOPRIM) 300 MG tablet Take 1 tablet (300 mg total) by mouth daily. PATIENT NEEDS OFFICE VISIT FOR ADDITIONAL REFILLS 06/29/15   Robyn Haber, MD  amLODipine (NORVASC) 5 MG tablet Take 1 tablet (5 mg total) by mouth daily. 01/01/15   Robyn Haber, MD  colchicine 0.6 MG tablet Take 1 tablet (0.6 mg total) by mouth daily. PATIENT NEEDS OFFICE VISIT FOR ADDITIONAL REFILLS 06/29/15   Robyn Haber, MD  ketoconazole (NIZORAL) 200 MG tablet Take 1 tablet (200 mg total) by mouth daily. 01/20/15   Robyn Haber, MD     No Known Allergies     Objective:  Physical Exam  Constitutional: He is oriented to person, place, and time. Vital signs are normal. He appears well-developed and well-nourished. He is active and cooperative. No distress.  BP 138/90 mmHg  Pulse 73  Temp(Src) 98.5 F (36.9 C) (Oral)  Resp 18  Ht 5\' 9"  (1.753 m)  Wt 235 lb 6 oz (106.765 kg)  BMI 34.74 kg/m2  SpO2 98%  HENT:  Head: Normocephalic and atraumatic.  Right Ear: Hearing normal.  Left Ear: Hearing normal.  Nose: Mucosal edema present. No rhinorrhea or sinus tenderness. Right sinus exhibits no  maxillary sinus tenderness and no frontal sinus tenderness. Left sinus exhibits no maxillary sinus tenderness and no frontal sinus tenderness.  Mouth/Throat: Uvula is midline, oropharynx is clear and moist and mucous membranes are normal. No oral lesions.  Eyes: Conjunctivae are normal. No scleral icterus.  Neck: Normal range of motion. Neck supple. No thyromegaly present.  Cardiovascular: Normal rate, regular rhythm and normal heart  sounds.   Pulses:      Radial pulses are 2+ on the right side, and 2+ on the left side.  Pulmonary/Chest: Effort normal and breath sounds normal.  Lymphadenopathy:       Head (right side): No tonsillar, no preauricular, no posterior auricular and no occipital adenopathy present.       Head (left side): No tonsillar, no preauricular, no posterior auricular and no occipital adenopathy present.    He has no cervical adenopathy.       Right: No supraclavicular adenopathy present.       Left: No supraclavicular adenopathy present.  Neurological: He is alert and oriented to person, place, and time. No sensory deficit.  Skin: Skin is warm, dry and intact. Rash noted. Rash is macular (bilateral shoulder, erythematous with fine scale, consistent with tinea). No cyanosis or erythema. Nails show no clubbing.  Psychiatric: He has a normal mood and affect. His speech is normal and behavior is normal.           Assessment & Plan:   1. Subacute sinusitis, unspecified location Supportive care. Anticipatory guidance. - amoxicillin-clavulanate (AUGMENTIN) 875-125 MG tablet; Take 1 tablet by mouth 2 (two) times daily.  Dispense: 20 tablet; Refill: 0 - ipratropium (ATROVENT) 0.03 % nasal spray; Place 2 sprays into both nostrils 2 (two) times daily.  Dispense: 30 mL; Refill: 0  2. Acute idiopathic gout, unspecified site Stable. Continue allopurinol. Advised not to use colchicine and ketoconazole simultaneously. - colchicine 0.6 MG tablet; Take 1 tablet (0.6 mg total) by mouth daily.  Dispense: 30 tablet; Refill: 1 - allopurinol (ZYLOPRIM) 300 MG tablet; Take 1 tablet (300 mg total) by mouth daily.  Dispense: 90 tablet; Refill: 3  3. Tinea corporis Repeat treatment that worked previously. Consider using Selsun Blue shampoo in the area regularly to prevent recurrence. If he has a gout flare, he needs to stop the ketoconazole. - ketoconazole (NIZORAL) 200 MG tablet; Take 1 tablet (200 mg total) by mouth  daily.  Dispense: 7 tablet; Refill: 1  4. Essential hypertension Controlled. Continue amlodipine. - CBC with Differential/Platelet - Comprehensive metabolic panel - TSH  5. Screening for HIV (human immunodeficiency virus) - HIV antibody  6. Need for hepatitis C screening test - Hepatitis C antibody  7. Need for shingles vaccine He will take this to his pharmacy when he is well. At that time, he'll also update seasonal influenza, Tdap and pneumococcal vaccines. - zoster vaccine live, PF, (ZOSTAVAX) 09811 UNT/0.65ML injection; Inject 19,400 Units into the skin once.  Dispense: 1 each; Refill: 0   Fara Chute, PA-C Physician Assistant-Certified [facility]

## 2015-07-16 NOTE — Patient Instructions (Addendum)
I will contact you with your lab results as soon as they are available.   If you have not heard from me in 2 weeks, please contact me.  The fastest way to get your results is to register for My Chart (see the instructions on the last page of this printout).  Please get the shingles vaccine, Tdap, seasonal flu and pneumococcal vaccine (Prevnar-13 now, Pneumovax in 12 months) at your local pharmacy.

## 2015-07-17 LAB — CBC WITH DIFFERENTIAL/PLATELET
Basophils Absolute: 0.1 10*3/uL (ref 0.0–0.1)
Basophils Relative: 1 % (ref 0–1)
EOS ABS: 0.4 10*3/uL (ref 0.0–0.7)
Eosinophils Relative: 3 % (ref 0–5)
HEMATOCRIT: 48.9 % (ref 39.0–52.0)
Hemoglobin: 15.9 g/dL (ref 13.0–17.0)
LYMPHS ABS: 2 10*3/uL (ref 0.7–4.0)
LYMPHS PCT: 16 % (ref 12–46)
MCH: 24.1 pg — ABNORMAL LOW (ref 26.0–34.0)
MCHC: 32.5 g/dL (ref 30.0–36.0)
MCV: 74.1 fL — AB (ref 78.0–100.0)
MONOS PCT: 11 % (ref 3–12)
MPV: 10.6 fL (ref 8.6–12.4)
Monocytes Absolute: 1.4 10*3/uL — ABNORMAL HIGH (ref 0.1–1.0)
NEUTROS PCT: 69 % (ref 43–77)
Neutro Abs: 8.6 10*3/uL — ABNORMAL HIGH (ref 1.7–7.7)
PLATELETS: 283 10*3/uL (ref 150–400)
RBC: 6.6 MIL/uL — ABNORMAL HIGH (ref 4.22–5.81)
RDW: 16.4 % — ABNORMAL HIGH (ref 11.5–15.5)
WBC: 12.4 10*3/uL — ABNORMAL HIGH (ref 4.0–10.5)

## 2015-07-17 LAB — COMPREHENSIVE METABOLIC PANEL
ALK PHOS: 62 U/L (ref 40–115)
ALT: 30 U/L (ref 9–46)
AST: 25 U/L (ref 10–35)
Albumin: 4.3 g/dL (ref 3.6–5.1)
BILIRUBIN TOTAL: 0.5 mg/dL (ref 0.2–1.2)
BUN: 16 mg/dL (ref 7–25)
CALCIUM: 9.5 mg/dL (ref 8.6–10.3)
CO2: 30 mmol/L (ref 20–31)
Chloride: 103 mmol/L (ref 98–110)
Creat: 1.31 mg/dL — ABNORMAL HIGH (ref 0.70–1.25)
Glucose, Bld: 87 mg/dL (ref 65–99)
Potassium: 4.5 mmol/L (ref 3.5–5.3)
Sodium: 141 mmol/L (ref 135–146)
TOTAL PROTEIN: 7.5 g/dL (ref 6.1–8.1)

## 2015-07-17 LAB — TSH: TSH: 1.247 u[IU]/mL (ref 0.350–4.500)

## 2015-07-17 LAB — HIV ANTIBODY (ROUTINE TESTING W REFLEX): HIV 1&2 Ab, 4th Generation: NONREACTIVE

## 2015-07-17 LAB — HEPATITIS C ANTIBODY: HCV Ab: NEGATIVE

## 2015-07-22 ENCOUNTER — Encounter: Payer: Self-pay | Admitting: Physician Assistant

## 2015-08-20 ENCOUNTER — Other Ambulatory Visit: Payer: Self-pay | Admitting: Family Medicine

## 2015-09-22 ENCOUNTER — Ambulatory Visit (INDEPENDENT_AMBULATORY_CARE_PROVIDER_SITE_OTHER): Payer: Medicare HMO

## 2015-09-22 ENCOUNTER — Ambulatory Visit (INDEPENDENT_AMBULATORY_CARE_PROVIDER_SITE_OTHER): Payer: Medicare HMO | Admitting: Emergency Medicine

## 2015-09-22 VITALS — BP 160/96 | HR 88 | Temp 98.5°F | Resp 20 | Ht 68.75 in | Wt 241.0 lb

## 2015-09-22 DIAGNOSIS — S43421A Sprain of right rotator cuff capsule, initial encounter: Secondary | ICD-10-CM

## 2015-09-22 DIAGNOSIS — M25511 Pain in right shoulder: Secondary | ICD-10-CM | POA: Diagnosis not present

## 2015-09-22 MED ORDER — COLCHICINE 0.6 MG PO TABS: ORAL_TABLET | ORAL | Status: DC

## 2015-09-22 MED ORDER — TRAMADOL HCL 50 MG PO TABS: 50.0000 mg | ORAL_TABLET | Freq: Three times a day (TID) | ORAL | Status: DC | PRN

## 2015-09-22 NOTE — Patient Instructions (Addendum)
Because you received an x-ray today, you will receive an invoice from [facility]. Please contact [facility] at [phone] with questions or concerns regarding your invoice. Our billing staff will not be able to assist you with those questions. Rotator Cuff Injury Rotator cuff injury is any type of injury to the set of muscles and tendons that make up the stabilizing unit of your shoulder. This unit holds the ball of your upper arm bone (humerus) in the socket of your shoulder blade (scapula).  CAUSES Injuries to your rotator cuff most commonly come from sports or activities that cause your arm to be moved repeatedly over your head. Examples of this include throwing, weight lifting, swimming, or racquet sports. Long lasting (chronic) irritation of your rotator cuff can cause soreness and swelling (inflammation), bursitis, and eventual damage to your tendons, such as a tear (rupture). SIGNS AND SYMPTOMS Acute rotator cuff tear:  Sudden tearing sensation followed by severe pain shooting from your upper shoulder down your arm toward your elbow.  Decreased range of motion of your shoulder because of pain and muscle spasm.  Severe pain.  Inability to raise your arm out to the side because of pain and loss of muscle power (large tears). Chronic rotator cuff tear:  Pain that usually is worse at night and may interfere with sleep.  Gradual weakness and decreased shoulder motion as the pain worsens.  Decreased range of motion. Rotator cuff tendinitis:  Deep ache in your shoulder and the outside upper arm over your shoulder.  Pain that comes on gradually and becomes worse when lifting your arm to the side or turning it inward. DIAGNOSIS Rotator cuff injury is diagnosed through a medical history, physical exam, and imaging exam. The medical history helps determine the type of rotator cuff injury. Your health care provider will look at your injured shoulder, feel the injured  area, and ask you to move your shoulder in different positions. X-ray exams typically are done to rule out other causes of shoulder pain, such as fractures. MRI is the exam of choice for the most severe shoulder injuries because the images show muscles and tendons.  TREATMENT  Chronic tear:  Medicine for pain, such as acetaminophen or ibuprofen.  Physical therapy and range-of-motion exercises may be helpful in maintaining shoulder function and strength.  Steroid injections into your shoulder joint.  Surgical repair of the rotator cuff if the injury does not heal with noninvasive treatment. Acute tear:  Anti-inflammatory medicines such as ibuprofen and naproxen to help reduce pain and swelling.  A sling to help support your arm and rest your rotator cuff muscles. Long-term use of a sling is not advised. It may cause significant stiffening of the shoulder joint.  Surgery may be considered within a few weeks, especially in younger, active people, to return the shoulder to full function.  Indications for surgical treatment include the following:  Age younger than 4 years.  Rotator cuff tears that are complete.  Physical therapy, rest, and anti-inflammatory medicines have been used for 6-8 weeks, with no improvement.  Employment or sporting activity that requires constant shoulder use. Tendinitis:  Anti-inflammatory medicines such as ibuprofen and naproxen to help reduce pain and swelling.  A sling to help support your arm and rest your rotator cuff muscles. Long-term use of a sling is not advised. It may cause significant stiffening of the shoulder joint.  Severe tendinitis may require:  Steroid injections into your shoulder joint.  Physical therapy.  Surgery. HOME CARE INSTRUCTIONS  Apply ice to your injury:  Put ice in a plastic bag.  Place a towel between your skin and the bag.  Leave the ice on for 20 minutes, 2-3 times a day.  If you have a shoulder immobilizer  (sling and straps), wear it until told otherwise by your health care provider.  You may want to sleep on several pillows or in a recliner at night to lessen swelling and pain.  Only take over-the-counter or prescription medicines for pain, discomfort, or fever as directed by your health care provider.  Do simple hand squeezing exercises with a soft rubber ball to decrease hand swelling. SEEK MEDICAL CARE IF:   Your shoulder pain increases, or new pain or numbness develops in your arm, hand, or fingers.  Your hand or fingers are colder than your other hand. SEEK IMMEDIATE MEDICAL CARE IF:   Your arm, hand, or fingers are numb or tingling.  Your arm, hand, or fingers are increasingly swollen and painful, or they turn white or blue. MAKE SURE YOU:  Understand these instructions.  Will watch your condition.  Will get help right away if you are not doing well or get worse.   This information is not intended to replace advice given to you by your health care provider. Make sure you discuss any questions you have with your health care provider.   Document Released: 08/09/2000 Document Revised: 08/17/2013 Document Reviewed: 03/24/2013 Elsevier Interactive Patient Education [employer].

## 2015-09-22 NOTE — Progress Notes (Signed)
By signing my name below, I, Raven Small, attest that this documentation has been prepared under the direction and in the presence of Arlyss Queen, MD.  Electronically Signed: Thea Alken, ED Scribe. 09/22/2015. 11:35 AM.  Chief Complaint:  Chief Complaint  Patient presents with  . Shoulder Pain    Rt Shoulder injury from a fall x yesterday    HPI: Jerry Chapman is a 65 y.o. male who reports to [facility] today complaining of a fall that occurred yesterday. Pt states he was running up steps when he hit his toe and fell forward, landing on concrete. States he tried to catch himself with his right hand/arm. He now has right shoulder pain only with movements as well a difficulty lifting right arm. No numbness of weakness.    Past Medical History  Diagnosis Date  . Hypertension   . History of prostate cancer     S/P RADIOACTIVE SEED IMPLANTS--  no recurrence  . Phimosis   . Arthritis, gouty    Past Surgical History  Procedure Laterality Date  . Prostate biopsy       + adencarcinoma  . Radioactive prostate seed implants  2004  . Circumcision N/A 07/05/2013    Procedure: CIRCUMCISION ADULT, aspiration of right hydrocele, meatal dilitation;  Surgeon: Claybon Jabs, MD;  Location: [facility];  Service: Urology;  Laterality: N/A;   Social History   Social History  . Marital Status: Divorced    Spouse Name: N/A  . Number of Children: N/A  . Years of Education: N/A   Social History Main Topics  . Smoking status: Never Smoker   . Smokeless tobacco: Never Used  . Alcohol Use: 1.0 oz/week    2 drink(s) per week  . Drug Use: No  . Sexual Activity: Not Asked   Other Topics Concern  . 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    Family History  Problem Relation Age of Onset  . Colon cancer Neg Hx   . Pancreatic cancer Neg Hx   . Stomach cancer Neg Hx   . Esophageal cancer Neg Hx    No Known Allergies Prior to  Admission medications   Medication Sig Start Date End Date Taking? Authorizing Provider  allopurinol (ZYLOPRIM) 300 MG tablet Take 1 tablet (300 mg total) by mouth daily. 07/16/15  Yes Chelle Jeffery, PA-C  amLODipine (NORVASC) 5 MG tablet Take 1 tablet (5 mg total) by mouth daily. 01/01/15  Yes Robyn Haber, MD  colchicine 0.6 MG tablet TAKE 1 TABLET(0.6 MG) BY MOUTH DAILY 08/22/15  Yes Robyn Haber, MD     ROS: The patient denies fevers, chills, night sweats, unintentional weight loss, chest pain, palpitations, wheezing, dyspnea on exertion, nausea, vomiting, abdominal pain, dysuria, hematuria, melena, numbness, weakness, or tingling.   All other systems have been reviewed and were otherwise negative with the exception of those mentioned in the HPI and as above.    PHYSICAL EXAM: Filed Vitals:   09/22/15 1108  BP: 160/96  Pulse: 88  Temp: 98.5 F (36.9 C)  Resp: 20   Body mass index is 35.86 kg/(m^2).   General: Alert, no acute distress HEENT:  Normocephalic, atraumatic, oropharynx patent. Eye: [facility] Cardiovascular:  Regular rate and rhythm, no rubs murmurs or gallops.  No Carotid bruits, radial pulse intact. No pedal edema.  Respiratory: Clear to auscultation bilaterally.  No wheezes, rales, or rhonchi.  No cyanosis, no use  of accessory musculature Abdominal: No organomegaly, abdomen is soft and non-tender, positive bowel sounds.  No masses. Musculoskeletal: Gait intact. No edema, tenderness.  He has limited external rotation of right shoulder. He is unable to abduct shoulder past 30 degrees. NVI.  Skin: No rashes. Neurologic: Facial musculature symmetric. Psychiatric: Patient acts appropriately throughout our interaction. Lymphatic: No cervical or submandibular lymphadenopathy   EKG/XRAY:   Primary read interpreted by Dr. Everlene Farrier at [facility] Right shoulder:  ASSESSMENT/PLAN: No fracture seen on x-ray. Films sent to radiology. Will be on Ultram for pain. He was  given a sling. Referral made to orthopedics. I suspect he has a significant right rotator cuff injury.I personally performed the services described in this documentation, which was scribed in my presence. The recorded information has been reviewed and is accurate.   Gross sideeffects, risk and benefits, and alternatives of medications d/w patient. Patient is aware that all medications have potential sideeffects and we are unable to predict every sideeffect or drug-drug interaction that may occur.  Arlyss Queen MD 09/22/2015 11:35 AM

## 2015-09-26 ENCOUNTER — Other Ambulatory Visit: Payer: Self-pay | Admitting: Family Medicine

## 2015-10-25 ENCOUNTER — Other Ambulatory Visit: Payer: Self-pay | Admitting: Physician Assistant

## 2015-12-08 ENCOUNTER — Ambulatory Visit (INDEPENDENT_AMBULATORY_CARE_PROVIDER_SITE_OTHER): Payer: Medicare HMO | Admitting: Physician Assistant

## 2015-12-08 VITALS — BP 151/99 | HR 84 | Temp 98.3°F | Resp 16 | Ht 68.75 in | Wt 232.0 lb

## 2015-12-08 DIAGNOSIS — R319 Hematuria, unspecified: Secondary | ICD-10-CM | POA: Diagnosis not present

## 2015-12-08 LAB — POCT URINALYSIS DIP (MANUAL ENTRY)
Glucose, UA: NEGATIVE
LEUKOCYTES UA: NEGATIVE
NITRITE UA: NEGATIVE
Protein Ur, POC: >=300 — AB
SPEC GRAV UA: 1.02
UROBILINOGEN UA: 1
pH, UA: 5.5

## 2015-12-08 LAB — POC MICROSCOPIC URINALYSIS (UMFC): Mucus: ABSENT

## 2015-12-08 NOTE — Progress Notes (Signed)
   Subjective:    Patient ID: Jerry Chapman, male    DOB: [birth year], 65 y.o.   MRN: [id number]  Chief Complaint  Patient presents with  . Hematuria    Started today, no pain    HPI  [name] is a 65 yo AA male with a history of prostate cancer s/p radioactive seed implants 10 years ago, arthritis, and gout here today for hematuria.  Patient stated that when he went to urinate this afternoon he passed a significant amount of blood in his urine. This was his first episode. This morning he urinated without any signs of hematuria.  He endorses urgency and frequency which is normal for patient after prostate cancer 10 years ago.  He denies fever, chills, unexpected weight loss/gain, N/V/D, abd pain, flank pain, dysuria, change in bowel habits.  Of note bp elevated today but he has not taken his bp medication this AM.     Review of Systems  Constitutional: Negative for fever, chills and unexpected weight change.  HENT: Negative.   Eyes: Negative.   Respiratory: Negative for cough and shortness of breath.   Cardiovascular: Negative for chest pain.  Gastrointestinal: Negative for nausea, vomiting, abdominal pain, diarrhea and blood in stool.  Genitourinary: Positive for urgency, frequency and hematuria. Negative for dysuria, flank pain, scrotal swelling, penile pain and testicular pain.  Neurological: Negative for dizziness, syncope, weakness and headaches.       Objective:   Physical Exam  Constitutional: He is oriented to person, place, and time. He appears well-developed and well-nourished. No distress.  Eyes: Pupils are equal, round, and reactive to light.  Cardiovascular: Normal rate, regular rhythm, normal heart sounds and intact distal pulses.   Pulmonary/Chest: Effort normal and breath sounds normal.  Abdominal: Soft. Bowel sounds are normal. There is no tenderness. There is no rebound and no guarding.  Neurological: He is alert and oriented to person, place, and  time.  Skin: Skin is warm and dry.  Psychiatric: He has a normal mood and affect. His behavior is normal. Judgment and thought content normal.          Assessment & Plan:  1. Hematuria Patient to follow up with urology. UA revealed large amount of blood. No signs of infections. Gross hematuria concerning for bladder cancer, can't r/o prostate involvement as well. Further work up needed by urology.  - POCT urinalysis dipstick - POCT Microscopic Urinalysis ([facility]) - Ambulatory referral to Urology  Melina Schools PA-S 12/08/2015

## 2015-12-08 NOTE — Patient Instructions (Signed)
     IF you received an x-ray today, you will receive an invoice from [facility]. Please contact [facility] at [phone] with questions or concerns regarding your invoice.   IF you received labwork today, you will receive an invoice from [facility]. Please contact [facility] at [phone] with questions or concerns regarding your invoice.   Our billing staff will not be able to assist you with questions regarding bills from these companies.  You will be contacted with the lab results as soon as they are available. The fastest way to get your results is to activate your My Chart account. Instructions are located on the last page of this paperwork. If you have not heard from us regarding the results in 2 weeks, please contact this office.      

## 2015-12-08 NOTE — Progress Notes (Signed)
Patient ID: Jerry Chapman, male    DOB: 04/09/1951, 65 y.o.   MRN: [id number]  PCP: Tally Due, MD  Subjective:   Chief Complaint  Patient presents with  . Hematuria    Started today, no pain    HPI Presents for evaluation of hematuria.  Patient stated that when he went to urinate this afternoon he passed a significant amount of blood in his urine. This was his first episode. This morning he urinated without any signs of hematuria.  He endorses urgency and frequency which is normal for patient after prostate cancer 10 years ago. He had a biopsy and radioactive seed implants.  He denies fever, chills, unexpected weight loss/gain, N/V/D, abd pain, flank pain, dysuria, change in bowel habits.  Of note bp elevated today but he has not taken his bp medication this AM. .     Review of Systems Constitutional: Negative for fever, chills and unexpected weight change.  HENT: Negative.  Eyes: Negative.  Respiratory: Negative for cough and shortness of breath.  Cardiovascular: Negative for chest pain.  Gastrointestinal: Negative for nausea, vomiting, abdominal pain, diarrhea and blood in stool.  Genitourinary: Positive for urgency, frequency and hematuria. Negative for dysuria, flank pain, scrotal swelling, penile pain and testicular pain.  Neurological: Negative for dizziness, syncope, weakness and headaches.     Patient Active Problem List   Diagnosis Date Noted  . Adenocarcinoma of prostate (HCC) 07/16/2015  . Acquired phimosis 07/05/2013  . HTN (hypertension) 05/09/2013  . Gout 05/09/2013  . Renal insufficiency 05/09/2013     Prior to Admission medications   Medication Sig Start Date End Date Taking? Authorizing Provider  allopurinol (ZYLOPRIM) 300 MG tablet Take 1 tablet (300 mg total) by mouth daily. 07/16/15  Yes Russell Quinney, PA-C  amLODipine (NORVASC) 5 MG tablet Take 1 tablet (5 mg total) by mouth daily. 01/01/15  Yes Elvina Sidle, MD    colchicine 0.6 MG tablet TAKE 1 TABLET(0.6 MG) BY MOUTH DAILY 09/22/15  Yes Collene Gobble, MD  traMADol (ULTRAM) 50 MG tablet Take 1 tablet (50 mg total) by mouth every 8 (eight) hours as needed. Patient not taking: Reported on 12/08/2015 09/22/15   Collene Gobble, MD     No Known Allergies     Objective:  Physical Exam  Constitutional: He is oriented to person, place, and time. He appears well-developed and well-nourished. He is active and cooperative. No distress.  BP 151/99 mmHg  Pulse 84  Temp(Src) 98.3 F (36.8 C) (Oral)  Resp 16  Ht 5' 8.75" (1.746 m)  Wt 232 lb (105.235 kg)  BMI 34.52 kg/m2  SpO2 96%  HENT:  Head: Normocephalic and atraumatic.  Right Ear: Hearing normal.  Left Ear: Hearing normal.  Eyes: Conjunctivae are normal. No scleral icterus.  Neck: Normal range of motion. Neck supple. No thyromegaly present.  Cardiovascular: Normal rate, regular rhythm and normal heart sounds.   Pulses:      Radial pulses are 2+ on the right side, and 2+ on the left side.  Pulmonary/Chest: Effort normal and breath sounds normal.  Abdominal: Normal appearance and bowel sounds are normal. He exhibits no shifting dullness, no distension, no pulsatile liver and no ascites. There is no hepatosplenomegaly. There is no tenderness. There is no CVA tenderness. A hernia is present. Hernia confirmed positive in the ventral area (non-tender, bulges with valsalva).  Lymphadenopathy:       Head (right side): No tonsillar, no preauricular, no posterior auricular and no  occipital adenopathy present.       Head (left side): No tonsillar, no preauricular, no posterior auricular and no occipital adenopathy present.    He has no cervical adenopathy.       Right: No supraclavicular adenopathy present.       Left: No supraclavicular adenopathy present.  Neurological: He is alert and oriented to person, place, and time. No sensory deficit.  Skin: Skin is warm, dry and intact. No rash noted. No cyanosis or  erythema. Nails show no clubbing.  Psychiatric: He has a normal mood and affect. His speech is normal and behavior is normal.       Results for orders placed or performed in visit on 12/08/15  POCT urinalysis dipstick  Result Value Ref Range   Color, UA brown (A) yellow   Clarity, UA cloudy (A) clear   Glucose, UA negative negative   Bilirubin, UA small (A) negative   Ketones, POC UA trace (5) (A) negative   Spec Grav, UA 1.020    Blood, UA large (A) negative   pH, UA 5.5    Protein Ur, POC >=300 (A) negative   Urobilinogen, UA 1.0    Nitrite, UA Negative Negative   Leukocytes, UA Negative Negative  POCT Microscopic Urinalysis ([facility])  Result Value Ref Range   WBC,UR,HPF,POC None None WBC/hpf   RBC,UR,HPF,POC Too numerous to count  (A) None RBC/hpf   Bacteria None None, Too numerous to count   Mucus Absent Absent   Epithelial Cells, UR Per Microscopy None None, Too numerous to count cells/hpf       Assessment & Plan:   1. Hematuria No evidence of infection at this point. No other symptoms suggestive of nephrolithiasis. Given his history of prostate cancer, ask urology to re-evaluate. - POCT urinalysis dipstick - POCT Microscopic Urinalysis ([facility]) - Ambulatory referral to Urology   Fernande Bras, PA-C Physician Assistant-Certified [facility]

## 2016-02-01 ENCOUNTER — Other Ambulatory Visit: Payer: Self-pay | Admitting: Urology

## 2016-02-07 ENCOUNTER — Encounter (HOSPITAL_BASED_OUTPATIENT_CLINIC_OR_DEPARTMENT_OTHER): Payer: Self-pay | Admitting: *Deleted

## 2016-02-07 NOTE — Progress Notes (Signed)
NPO AFTER MN. ARRIVE AT 0830. NEEDS ISTAT AND EKG. WILL TAKE AM MEDS W/ SIPS OF WATER DOS. 

## 2016-02-07 NOTE — Progress Notes (Signed)
   02/07/16 1639  OBSTRUCTIVE SLEEP APNEA  Have you ever been diagnosed with sleep apnea through a sleep study? No  Do you snore loudly (loud enough to be heard through closed doors)?  1  Do you often feel tired, fatigued, or sleepy during the daytime (such as falling asleep during driving or talking to someone)? 0  Has anyone observed you stop breathing during your sleep? 0  Do you have, or are you being treated for high blood pressure? 1  BMI more than 35 kg/m2? 1  Age > 77 (1-yes) 1  Male Gender (Yes=1) 1  Obstructive Sleep Apnea Score 5  Score 5 or greater  Results sent to PCP

## 2016-02-09 NOTE — Anesthesia Preprocedure Evaluation (Addendum)
Anesthesia Evaluation  Patient identified by MRN, date of birth, ID band Patient awake    Reviewed: Allergy & Precautions, NPO status , Patient's Chart, lab work & pertinent test results  Airway Mallampati: II       Dental  (+) Teeth Intact, Missing, Dental Advisory Given, Loose,    Pulmonary neg pulmonary ROS,    breath sounds clear to auscultation       Cardiovascular hypertension, Pt. on medications negative cardio ROS   Rhythm:Regular     Neuro/Psych negative neurological ROS  negative psych ROS   GI/Hepatic negative GI ROS, Neg liver ROS,   Endo/Other  negative endocrine ROSObesity, BMI 35  Renal/GU negative Renal ROS  negative genitourinary   Musculoskeletal negative musculoskeletal ROS (+)   Abdominal   Peds negative pediatric ROS (+)  Hematology negative hematology ROS (+)   Anesthesia Other Findings Top front teeth loose.  Will try to help protect with bite block in back  Reproductive/Obstetrics negative OB ROS                           Anesthesia Physical Anesthesia Plan  ASA: II  Anesthesia Plan: General   Post-op Pain Management:    Induction: Intravenous  Airway Management Planned: LMA  Additional Equipment:   Intra-op Plan:   Post-operative Plan:   Informed Consent: I have reviewed the patients History and Physical, chart, labs and discussed the procedure including the risks, benefits and alternatives for the proposed anesthesia with the patient or authorized representative who has indicated his/her understanding and acceptance.     Plan Discussed with:   Anesthesia Plan Comments: (LMA 5 for circumcision, check am ISTAT and EKG, ECG ok)       Anesthesia Quick Evaluation

## 2016-02-11 NOTE — H&P (Signed)
History of Present Illness  Jerry Chapman is a 65 year old male patient with a history of bilat hydroceles & gross hematuria.   Adenocarcinoma of the prostate: He was found to have a PSA of 10.5 in 12/05. DRE was found to be negative.   TRUS/BX 08/09/04: Prostate volume measured 34 cc.  Pathology: Gleason 3+3 = 6 in 5% of the specimen on the right side and 3+4 = 7 in 10% of the specimen from the left side.  Treatment: IMRT completed in 4/06.  His PSA fell to 0.44 in 3/08.  PSA 9/14 - 0.79.    Phimosis: He is found to have a severe phimosis secondary to BXO.   Treatment: Circumcision 11/14.   Right hydrocele: He had a large right hydrocele that caused his penile skin to be advanced down the shaft and at the time of circumcision in 11/14 I aspirated the hydrocele in order to allow the penis to heal without being enveloped with his penile skin.   Meatal stenosis: He was found to have meatal stenosis secondary to BXO in 11/14 at the time of his circumcision. I performed dilatation at that time.   BXO: He was found to have depigmentation of the inner prepucal skin and glans at the time of circumcision for a severe phimosis in 11/14.   Mild renal insufficiency: He was evaluated for a mild elevation in his creatinine with ultrasound in 10/14 with no abnormality having been noted. He therefore was referred to nephrology.   Interval history: He was seen last in 11/14 and was scheduled to return in order to follow up with his history of prostate cancer although he failed to return and failed to respond to a letter recommending his return. He has experienced gross, painless hematuria in 4/17 with no urinalysis findings to suggest infection. A creatinine in 11/16 was 1.31. He reports that he had an episode of gross, with hematuria that was associated with the passage of some clots. It occurred on 2 occasions back to back and has not occurred since. He indicated that he had no flank pain or  lower urinary tract symptoms when this occurred and denies any change in his voiding pattern. He has no history of stones and there is no family history of stone disease or GU malignancy. He has never been a smoker.   He also reported he has had scrotal swelling which is a bother to him and wanted to discuss further treatment. In addition he indicated that he has been having difficulty with erectile dysfunction. He inquired about sildenafil as an option for this. He has no contraindication to this medication.     PSA 9/14 - 0.79     Past Medical History  Problems   1. History of Acute Urate Nephropathy  2. History of Arthritis  3. History of Gout (M10.9)  4. History of hypertension (Z86.79)  5. Personal history of prostate cancer (Z85.46)   Surgical History  Problems   1. History of Circumcision No Clamp/Device/Dorsal Slit Older Than 28 Days  2. History of Cystoscopy For Urethral Stricture  3. History of Needle Biopsy Of Prostate  4. History of Radiation Therapy  5. History of Surgery Tunica Vaginalis Puncture Aspiration Of Hydrocele   Current Meds  1. Allopurinol 300 MG Oral Tablet;  Therapy: (Recorded:01Jun2017) to Recorded  2. AmLODIPine Besylate 5 MG Oral Tablet;  Therapy: 26Apr2017 to Recorded  3. Colchicine 0.6 MG Oral Tablet;  Therapy: (Recorded:01Jun2017) to Recorded   Allergies  Medication   1.  No Known Drug Allergies   Family History  Problems   1. Family history of Cancer : Mother  2. Family history of Family Health Status Number Of Children   1 son 2 daughters  3. Family history of Gout : Mother  4. Family history of Gout : Brother   Social History  Problems    Alcohol Use (History)   3 per week   Caffeine Use   2   Family history of Death In The Family Father   age 51 heart attack   Family history of Death In The Family Mother   age 7 cancer   Marital History - Divorced    Occupation:   Chief Strategy Officer   Denied: Tobacco Use   Review of Systems  Genitourinary, constitutional, skin, eye, otolaryngeal, hematologic/lymphatic, cardiovascular, pulmonary, endocrine, musculoskeletal, gastrointestinal, neurological and psychiatric system(s) were reviewed and pertinent findings if present are noted and are otherwise negative.   Genitourinary: urinary frequency, nocturia, incontinence, hematuria and erectile dysfunction.   Musculoskeletal: back pain.    Vitals  Vital Signs   Height: 5 ft 10 in  Weight: 230 lb   BMI Calculated: 33  BSA Calculated: 2.22  Blood Pressure: 147 / 90  Heart Rate: 73   Physical Exam  Constitutional: Well nourished and well developed . No acute distress.    ENT:. The ears and nose are normal in appearance.    Neck: The appearance of the neck is normal and no neck mass is present.    Pulmonary: No respiratory distress and normal respiratory rhythm and effort.    Cardiovascular: Heart rate and rhythm are normal . No peripheral edema.    Abdomen: The abdomen is soft and nontender. No masses are palpated. No CVA tenderness. No hernias are palpable. No hepatosplenomegaly noted.    Rectal: Rectal exam demonstrates normal sphincter tone, no tenderness and no masses. The prostate is smooth and flat . Estimated prostate size is 1+. The prostate has no nodularity and is not tender. The left seminal vesicle is nonpalpable. The right seminal vesicle is nonpalpable. The perineum is normal on inspection.    Genitourinary: Examination of the penis demonstrates phimosis, but no discharge, no masses, no lesions and a normal meatus. The penis is circumcised. The scrotum is without lesions. Examination of the right scrotum demonstrates a hydrocele. Examination of the left scrotum demostrates a hydrocele. The right epididymis is palpably normal and non-tender. The left epididymis is palpably normal and  non-tender. The right testis is non-tender and without masses. The left testis is non-tender and without masses.    Lymphatics: The femoral and inguinal nodes are not enlarged or tender.    Skin: Normal skin turgor, no visible rash and no visible skin lesions.    Neuro/Psych:. Mood and affect are appropriate.    Results/Data   Old records or history reviewed: [facility] from [facility] as above.   The following clinical lab reports were reviewed: UA: Clear.    Assessment  We discussed the need for further evaluation of the source of his gross hematuria. I went over CT scan and cystoscopy that will be necessary. He has never been a smoker and therefore cytology is not necessary.   He has bilateral hydroceles. They are symptomatic. He would like to proceed with surgical therapy as he indicates he has had no fluid aspirated in the past but they recurred. I went over the procedure with him including incision used, the risks and complications, the outpatient nature of the procedures  well as the probability of success and the anticipated postoperative course. He understands and has elected to proceed.   I have discussed with the patient the fact that sildenafil at a 20 mg dose used for the treatment of erectile dysfunction is considered an off label use of this medication by the FDA. We did discuss the fact that Viagra is sildenafil. He understands that being used on label does not mean that the drug is unsafe or that it will not work but rather it is the same medication as Viagra at a different dosage.  We discussed the common risks associated with sildenafil including allergic reaction such as itching, hives, swelling of the hands or face, swelling of the mouth or throat, chest tightness and trouble breathing. In addition we discussed the rare risk of priapism. He does understand lightheadedness, dizziness or fainting could occur as well as sudden changes in vision such as a blue  tint, decreased hearing and headache in addition to others.  He understands that he can receive a prescription for branded Viagra, a prescription for generic sildenafil to be filled at his local pharmacy, that he can purchase sildenafil from our office and that there are other treatment options available for his erectile dysfunction as well as electing not to be treated for this condition at all.  Understanding that he has elected to proceed with the use of sildenafil 20 mg and the medication was dispensed.    Plan  1. BUN and creatinine.  2. CT scan of the abdomen and pelvis with and without contrast using hematuria protocol.  3. Return for completion of the hematuria workup with cystoscopy.   4. He will be scheduled for outpatient bilateral hydrocelectomy.

## 2016-02-12 ENCOUNTER — Encounter (HOSPITAL_BASED_OUTPATIENT_CLINIC_OR_DEPARTMENT_OTHER): Payer: Self-pay | Admitting: Anesthesiology

## 2016-02-12 ENCOUNTER — Ambulatory Visit (HOSPITAL_BASED_OUTPATIENT_CLINIC_OR_DEPARTMENT_OTHER): Payer: Medicare HMO | Admitting: Anesthesiology

## 2016-02-12 ENCOUNTER — Encounter (HOSPITAL_BASED_OUTPATIENT_CLINIC_OR_DEPARTMENT_OTHER)
Admission: RE | Disposition: A | Discharge status: Home / Self Care | Payer: Self-pay | Source: Ambulatory Visit | Attending: Urology

## 2016-02-12 ENCOUNTER — Ambulatory Visit (HOSPITAL_BASED_OUTPATIENT_CLINIC_OR_DEPARTMENT_OTHER)
Admission: RE | Admit: 2016-02-12 | Discharge: 2016-02-12 | Disposition: A | Discharge status: Home / Self Care | Payer: Medicare HMO | Source: Ambulatory Visit | Attending: Urology | Admitting: Urology

## 2016-02-12 ENCOUNTER — Other Ambulatory Visit: Payer: Self-pay

## 2016-02-12 DIAGNOSIS — N529 Male erectile dysfunction, unspecified: Secondary | ICD-10-CM | POA: Diagnosis not present

## 2016-02-12 DIAGNOSIS — Z923 Personal history of irradiation: Secondary | ICD-10-CM | POA: Insufficient documentation

## 2016-02-12 DIAGNOSIS — N433 Hydrocele, unspecified: Secondary | ICD-10-CM | POA: Diagnosis present

## 2016-02-12 DIAGNOSIS — Z79899 Other long term (current) drug therapy: Secondary | ICD-10-CM | POA: Insufficient documentation

## 2016-02-12 DIAGNOSIS — Z8546 Personal history of malignant neoplasm of prostate: Secondary | ICD-10-CM | POA: Diagnosis not present

## 2016-02-12 DIAGNOSIS — R310 Gross hematuria: Secondary | ICD-10-CM | POA: Insufficient documentation

## 2016-02-12 DIAGNOSIS — E669 Obesity, unspecified: Secondary | ICD-10-CM | POA: Insufficient documentation

## 2016-02-12 DIAGNOSIS — I10 Essential (primary) hypertension: Secondary | ICD-10-CM | POA: Insufficient documentation

## 2016-02-12 DIAGNOSIS — Z6835 Body mass index (BMI) 35.0-35.9, adult: Secondary | ICD-10-CM | POA: Insufficient documentation

## 2016-02-12 DIAGNOSIS — M1990 Unspecified osteoarthritis, unspecified site: Secondary | ICD-10-CM | POA: Insufficient documentation

## 2016-02-12 DIAGNOSIS — N3289 Other specified disorders of bladder: Secondary | ICD-10-CM | POA: Insufficient documentation

## 2016-02-12 DIAGNOSIS — M109 Gout, unspecified: Secondary | ICD-10-CM | POA: Insufficient documentation

## 2016-02-12 HISTORY — DX: Frequency of micturition: R35.0

## 2016-02-12 HISTORY — DX: Hydrocele, unspecified: N43.3

## 2016-02-12 HISTORY — PX: CYSTOSCOPY: SHX5120

## 2016-02-12 HISTORY — DX: Other specified personal risk factors, not elsewhere classified: Z91.89

## 2016-02-12 HISTORY — PX: HYDROCELE EXCISION: SHX482

## 2016-02-12 HISTORY — DX: Unspecified rotator cuff tear or rupture of right shoulder, not specified as traumatic: M75.101

## 2016-02-12 LAB — POCT I-STAT, CHEM 8
BUN: 17 mg/dL (ref 6–20)
Calcium, Ion: 1.2 mmol/L (ref 1.13–1.30)
Chloride: 105 mmol/L (ref 101–111)
Creatinine, Ser: 1.1 mg/dL (ref 0.61–1.24)
Glucose, Bld: 107 mg/dL — ABNORMAL HIGH (ref 65–99)
HCT: 48 % (ref 39.0–52.0)
Hemoglobin: 16.3 g/dL (ref 13.0–17.0)
Potassium: 4 mmol/L (ref 3.5–5.1)
Sodium: 144 mmol/L (ref 135–145)
TCO2: 26 mmol/L (ref 0–100)

## 2016-02-12 SURGERY — HYDROCELECTOMY
Anesthesia: General | Laterality: Bilateral

## 2016-02-12 MED ORDER — PROPOFOL 10 MG/ML IV BOLUS
INTRAVENOUS | Status: DC | PRN
  Administered 2016-02-12: 250 mg via INTRAVENOUS

## 2016-02-12 MED ORDER — LACTATED RINGERS IV SOLN
INTRAVENOUS | Status: DC
  Administered 2016-02-12 (×2): via INTRAVENOUS
  Filled 2016-02-12: qty 1000

## 2016-02-12 MED ORDER — CEFAZOLIN SODIUM-DEXTROSE 2-4 GM/100ML-% IV SOLN
2.0000 g | INTRAVENOUS | Status: AC
  Administered 2016-02-12: 2 g via INTRAVENOUS
  Filled 2016-02-12: qty 100

## 2016-02-12 MED ORDER — BUPIVACAINE-EPINEPHRINE 0.5% -1:200000 IJ SOLN
INTRAMUSCULAR | Status: DC | PRN
  Administered 2016-02-12: 10 mL

## 2016-02-12 MED ORDER — MIDAZOLAM HCL 5 MG/5ML IJ SOLN
INTRAMUSCULAR | Status: DC | PRN
  Administered 2016-02-12: 2 mg via INTRAVENOUS

## 2016-02-12 MED ORDER — PROMETHAZINE HCL 25 MG/ML IJ SOLN
6.2500 mg | INTRAMUSCULAR | Status: DC | PRN
  Filled 2016-02-12: qty 1

## 2016-02-12 MED ORDER — KETOROLAC TROMETHAMINE 30 MG/ML IJ SOLN
INTRAMUSCULAR | Status: DC | PRN
  Administered 2016-02-12: 30 mg via INTRAVENOUS

## 2016-02-12 MED ORDER — FENTANYL CITRATE (PF) 100 MCG/2ML IJ SOLN
25.0000 ug | INTRAMUSCULAR | Status: DC | PRN
  Filled 2016-02-12: qty 1

## 2016-02-12 MED ORDER — CEFAZOLIN SODIUM-DEXTROSE 2-4 GM/100ML-% IV SOLN
INTRAVENOUS | Status: AC
  Filled 2016-02-12: qty 100

## 2016-02-12 MED ORDER — MEPERIDINE HCL 25 MG/ML IJ SOLN
6.2500 mg | INTRAMUSCULAR | Status: DC | PRN
  Filled 2016-02-12: qty 1

## 2016-02-12 MED ORDER — ONDANSETRON HCL 4 MG/2ML IJ SOLN
INTRAMUSCULAR | Status: AC
  Filled 2016-02-12: qty 2

## 2016-02-12 MED ORDER — PROPOFOL 10 MG/ML IV BOLUS
INTRAVENOUS | Status: AC
  Filled 2016-02-12: qty 40

## 2016-02-12 MED ORDER — LIDOCAINE HCL (CARDIAC) 20 MG/ML IV SOLN
INTRAVENOUS | Status: DC | PRN
  Administered 2016-02-12: 100 mg via INTRAVENOUS

## 2016-02-12 MED ORDER — FENTANYL CITRATE (PF) 100 MCG/2ML IJ SOLN
INTRAMUSCULAR | Status: AC
  Filled 2016-02-12: qty 2

## 2016-02-12 MED ORDER — LIDOCAINE HCL (CARDIAC) 20 MG/ML IV SOLN
INTRAVENOUS | Status: AC
  Filled 2016-02-12: qty 5

## 2016-02-12 MED ORDER — ONDANSETRON HCL 4 MG/2ML IJ SOLN
INTRAMUSCULAR | Status: DC | PRN
  Administered 2016-02-12: 4 mg via INTRAVENOUS

## 2016-02-12 MED ORDER — OXYCODONE HCL 10 MG PO TABS: 10.0000 mg | ORAL_TABLET | ORAL | Status: DC | PRN

## 2016-02-12 MED ORDER — DEXAMETHASONE SODIUM PHOSPHATE 10 MG/ML IJ SOLN
INTRAMUSCULAR | Status: DC | PRN
  Administered 2016-02-12: 10 mg via INTRAVENOUS

## 2016-02-12 MED ORDER — MIDAZOLAM HCL 2 MG/2ML IJ SOLN
INTRAMUSCULAR | Status: AC
  Filled 2016-02-12: qty 2

## 2016-02-12 MED ORDER — FENTANYL CITRATE (PF) 100 MCG/2ML IJ SOLN
INTRAMUSCULAR | Status: DC | PRN
  Administered 2016-02-12 (×5): 25 ug via INTRAVENOUS

## 2016-02-12 MED ORDER — KETOROLAC TROMETHAMINE 30 MG/ML IJ SOLN
INTRAMUSCULAR | Status: AC
  Filled 2016-02-12: qty 1

## 2016-02-12 MED ORDER — DEXAMETHASONE SODIUM PHOSPHATE 10 MG/ML IJ SOLN
INTRAMUSCULAR | Status: AC
  Filled 2016-02-12: qty 1

## 2016-02-12 SURGICAL SUPPLY — 40 items
BLADE CLIPPER SURG (BLADE) IMPLANT
BLADE SURG 15 STRL LF DISP TIS (BLADE) ×2 IMPLANT
BLADE SURG 15 STRL SS (BLADE) ×1
BNDG GAUZE ELAST 4 BULKY (GAUZE/BANDAGES/DRESSINGS) ×3 IMPLANT
CANISTER SUCTION 1200CC (MISCELLANEOUS) IMPLANT
CANISTER SUCTION 2500CC (MISCELLANEOUS) IMPLANT
CLEANER CAUTERY TIP 5X5 PAD (MISCELLANEOUS) IMPLANT
COVER BACK TABLE 60X90IN (DRAPES) ×3 IMPLANT
COVER MAYO STAND STRL (DRAPES) ×3 IMPLANT
DISSECTOR ROUND CHERRY 3/8 STR (MISCELLANEOUS) IMPLANT
DRAIN PENROSE 18X1/4 LTX STRL (WOUND CARE) IMPLANT
DRAPE LAPAROTOMY 100X72 PEDS (DRAPES) ×3 IMPLANT
DRSG KUZMA FLUFF (GAUZE/BANDAGES/DRESSINGS) ×3 IMPLANT
ELECT REM PT RETURN 9FT ADLT (ELECTROSURGICAL)
ELECTRODE REM PT RTRN 9FT ADLT (ELECTROSURGICAL) IMPLANT
GLOVE BIO SURGEON STRL SZ8 (GLOVE) ×3 IMPLANT
GOWN STRL REUS W/ TWL LRG LVL3 (GOWN DISPOSABLE) ×2 IMPLANT
GOWN STRL REUS W/ TWL XL LVL3 (GOWN DISPOSABLE) ×2 IMPLANT
GOWN STRL REUS W/TWL LRG LVL3 (GOWN DISPOSABLE) ×1
GOWN STRL REUS W/TWL XL LVL3 (GOWN DISPOSABLE) ×1
KIT ROOM TURNOVER WOR (KITS) ×3 IMPLANT
NEEDLE HYPO 25X1 1.5 SAFETY (NEEDLE) IMPLANT
NS IRRIG 500ML POUR BTL (IV SOLUTION) ×3 IMPLANT
PACK BASIN DAY SURGERY FS (CUSTOM PROCEDURE TRAY) ×3 IMPLANT
PAD CLEANER CAUTERY TIP 5X5 (MISCELLANEOUS)
PENCIL BUTTON HOLSTER BLD 10FT (ELECTRODE) ×3 IMPLANT
SUPPORT SCROTAL LG STRP (MISCELLANEOUS) ×3 IMPLANT
SUT CHROMIC 3 0 SH 27 (SUTURE) ×9 IMPLANT
SUT ETHILON 3 0 PS 1 (SUTURE) IMPLANT
SUT SILK 4 0 TIES 17X18 (SUTURE) IMPLANT
SUT VIC AB 4-0 RB1 27 (SUTURE)
SUT VIC AB 4-0 RB1 27X BRD (SUTURE) IMPLANT
SUT VICRYL 6 0 RB 1 (SUTURE) IMPLANT
SYR CONTROL 10ML LL (SYRINGE) IMPLANT
TOWEL OR 17X24 6PK STRL BLUE (TOWEL DISPOSABLE) ×6 IMPLANT
TRAY DSU PREP LF (CUSTOM PROCEDURE TRAY) ×3 IMPLANT
TUBE CONNECTING 12X1/4 (SUCTIONS) ×3 IMPLANT
WATER STERILE IRR 3000ML UROMA (IV SOLUTION) IMPLANT
WATER STERILE IRR 500ML POUR (IV SOLUTION) ×3 IMPLANT
YANKAUER SUCT BULB TIP NO VENT (SUCTIONS) ×3 IMPLANT

## 2016-02-12 NOTE — Transfer of Care (Signed)
   Last Vitals:  Filed Vitals:   02/12/16 0847  BP: 152/97  Pulse: 74  Temp: 37 C  Resp: 16    Last Pain: There were no vitals filed for this visit.    Patients Stated Pain Goal: 10 (02/12/16 [id number])  Immediate Anesthesia Transfer of Care Note  Patient: Jerry Chapman  Procedure(s) Performed: Procedure(s) (LRB): HYDROCELECTOMY ADULT (Bilateral) CYSTOSCOPY  Patient Location: PACU  Anesthesia Type: General  Level of Consciousness: awake, alert  and oriented  Airway & Oxygen Therapy: Patient Spontanous Breathing and Patient connected to face mask oxygen  Post-op Assessment: Report given to PACU RN and Post -op Vital signs reviewed and stable  Post vital signs: Reviewed and stable  Complications: No apparent anesthesia complications

## 2016-02-12 NOTE — Discharge Instructions (Signed)
Scrotal surgery postoperative instructions ° °Wound: ° °In most cases your incision will have absorbable sutures that will dissolve within the first 10-20 days. Some will fall out even earlier. Expect some redness as the sutures dissolved but this should occur only around the sutures. If there is generalized redness, especially with increasing pain or swelling, let us know. The scrotum will very likely get "black and blue" as the blood in the tissues spread. Sometimes the whole scrotum will turn colors. The black and blue is followed by a yellow and brown color. In time, all the discoloration will go away. In some cases some firm swelling in the area of the testicle may persist for up to 4-6 weeks after the surgery and is considered normal in most cases. ° °Diet: ° °You may return to your normal diet within 24 hours following your surgery. You may note some mild nausea and possibly vomiting the first 6-8 hours following surgery. This is usually due to the side effects of anesthesia, and will disappear quite soon. I would suggest clear liquids and a very light meal the first evening following your surgery. ° °Activity: ° °Your physical activity should be restricted the first 48 hours. During that time you should remain relatively inactive, moving about only when necessary. During the first 7-10 days following surgery he should avoid lifting any heavy objects (anything greater than 15 pounds), and avoid strenuous exercise. If you work, ask us specifically about your restrictions, both for work and home. We will write a note to your employer if needed. ° °You should plan to wear a tight pair of jockey shorts or an athletic supporter for the first 4-5 days, even to sleep. This will keep the scrotum immobilized to some degree and keep the swelling down. ° °Ice packs should be placed on and off over the scrotum for the first 48 hours. Frozen peas or corn in a ZipLock bag can be frozen, used and re-frozen. Fifteen minutes  on and 15 minutes off is a reasonable schedule. The ice is a good pain reliever and keeps the swelling down. ° °Hygiene: ° °You may shower 48 hours after your surgery. Tub bathing should be restricted until the seventh day. ° ° ° ° ° ° ° ° ° °Medication: ° °You will be sent home with some type of pain medication. In many cases you will be sent home with a narcotic pain pill (hydrococone or oxycodone). If the pain is not too bad, you may take either Tylenol (acetaminophen) or Advil (ibuprofen) which contain no narcotic agents, and might be tolerated a little better, with fewer side effects. If the pain medication you are sent home with does not control the pain, you will have to let us know. Some narcotic pain medications cannot be given or refilled by a phone call to a pharmacy. ° °Problems you should report to us: ° °· Fever of 101.0 degrees Fahrenheit or greater. °· Moderate or severe swelling under the skin incision or involving the scrotum. °· Drug reaction such as hives, a rash, nausea or vomiting. °·  ° ° °Post Anesthesia Home Care Instructions ° °Activity: °Get plenty of rest for the remainder of the day. A responsible adult should stay with you for 24 hours following the procedure.  °For the next 24 hours, DO NOT: °-Drive a car °-Operate machinery °-Drink alcoholic beverages °-Take any medication unless instructed by your physician °-Make any legal decisions or sign important papers. ° °Meals: °Start with liquid foods such as   gelatin or soup. Progress to regular foods as tolerated. Avoid greasy, spicy, heavy foods. If nausea and/or vomiting occur, drink only clear liquids until the nausea and/or vomiting subsides. Call your physician if vomiting continues. ° °Special Instructions/Symptoms: °Your throat may feel dry or sore from the anesthesia or the breathing tube placed in your throat during surgery. If this causes discomfort, gargle with warm salt water. The discomfort should disappear within 24  hours. ° °If you had a scopolamine patch placed behind your ear for the management of post- operative nausea and/or vomiting: ° °1. The medication in the patch is effective for 72 hours, after which it should be removed.  Wrap patch in a tissue and discard in the trash. Wash hands thoroughly with soap and water. °2. You may remove the patch earlier than 72 hours if you experience unpleasant side effects which may include dry mouth, dizziness or visual disturbances. °3. Avoid touching the patch. Wash your hands with soap and water after contact with the patch. °  ° °

## 2016-02-12 NOTE — Op Note (Signed)
PATIENT:  Jerry Chapman  PRE-OPERATIVE DIAGNOSIS: 1. Bilateral hydroceles 2. Gross hematuria  POST-OPERATIVE DIAGNOSIS:  Same  PROCEDURE:  Procedure(s): 1. Right & Left hydrocelectomy 2. Cystocopy  SURGEON:  Claybon Jabs  INDICATION: [name] is a 65 year old male who experienced gross hematuria recently. He has a history of having undergone external beam radiation for prostate cancer and was really not having any symptoms associated with his hematuria. He underwent upper tract evaluation with a hematuria protocol CT scan which revealed no abnormality to account for his gross hematuria. We therefore discussed proceeding with cystoscopy today to evaluate his lower tract in addition to proceeding with his bilateral hydrocelectomy. He had mentioned to me that he was experiencing increasing size of his bilateral hydroceles. They were symptomatic and he has elected to proceed with surgical correction.  ANESTHESIA:  General  EBL:  Minimal  DRAINS: None  LOCAL MEDICATIONS USED:  10 mL of 1/2% Marcaine with epinephrine  SPECIMEN:  None  DISPOSITION OF SPECIMEN:  N/A  Description of procedure: After informed consent the patient was brought to the major OR, placed on the table and administered general anesthesia. His genitalia was then sterilely prepped and draped. An official timeout was then performed.  A midline median raphae scrotal incision was then made and carried down over the right hydrocele. The tissue over the hydrocele was cleared using a combination of sharp and blunt technique. The hydrocele was then opened, drained of clear amber fluid and delivered through the incision. The excess hydrocele sac tissue was excised with the Bovie electrocautery and then I cauterized the edges. I then reapproximated the edges posteriorly behind the epididymis with a running, locking 3-0 chromic suture. The appendix testis was removed with the Bovie.  I then turned my attention to the  left hydrocele and manage this in an identical fashion.   Both testicles were then replaced in their respective hemiscrotums. I then closed the deep scrotal tissue with running 3-0 chromic suture in a locking fashion. I injected half percent Marcaine in the subcutaneous tissue and closed the skin with running 3-0 chromic.  I then proceeded with flexible cystoscopy. The 50 French flexible scope was passed down the urethra under direct vision. The urethra was noted be entirely normal. The sphincter appeared intact and the prostatic urethra revealed no significant obstructive component. There were no lesions within the prostatic urethra. There was some mild inflammatory changes noted at the bladder neck but no worrisome lesions. Upon entering the bladder and noted one plus trabeculation. There were no tumors, stones or inflammatory lesions noted on full and systematic inspection of the bladder. Both ureteral orifices were noted to be of normal configuration and position with clear reflux. Retroflexion of the scope revealed telangiectasia surrounding the bladder neck as a very probable source of his gross hematuria. No other abnormality was noted. I therefore removed the cystoscope, inserted a 16 French red rubber catheter to drain the bladder and the catheter was then removed.  Neosporin, a sterile gauze dressing, fluff Kerlix and a scrotal support were applied. The patient tolerated the procedure well no intraoperative complications. Needle sponge and instrument counts were correct at the end of the operation.   PLAN OF CARE: Discharge to home after PACU  PATIENT DISPOSITION:  PACU - hemodynamically stable.

## 2016-02-12 NOTE — Anesthesia Postprocedure Evaluation (Signed)
Anesthesia Post Note  Patient: Jerry Chapman  Procedure(s) Performed: Procedure(s) (LRB): HYDROCELECTOMY ADULT (Bilateral) CYSTOSCOPY  Patient location during evaluation: PACU Anesthesia Type: General Level of consciousness: awake and alert Pain management: pain level controlled Vital Signs Assessment: post-procedure vital signs reviewed and stable Respiratory status: spontaneous breathing, nonlabored ventilation, respiratory function stable and patient connected to nasal cannula oxygen Cardiovascular status: blood pressure returned to baseline and stable Postop Assessment: no signs of nausea or vomiting Anesthetic complications: no    Last Vitals:  Filed Vitals:   02/12/16 1102 02/12/16 1105  BP: 155/106 140/92  Pulse: 69 71  Temp: 36.8 C   Resp: 16 18    Last Pain:  Filed Vitals:   02/12/16 1107  PainSc: Asleep                 Alexis Frock

## 2016-02-12 NOTE — Anesthesia Procedure Notes (Signed)
Procedure Name: LMA Insertion Date/Time: 02/12/2016 9:59 AM Performed by: Justice Rocher Pre-anesthesia Checklist: Patient identified, Emergency Drugs available, Suction available and Patient being monitored Patient Re-evaluated:Patient Re-evaluated prior to inductionOxygen Delivery Method: Circle system utilized Preoxygenation: Pre-oxygenation with 100% oxygen Intubation Type: IV induction Ventilation: Mask ventilation without difficulty LMA: LMA inserted LMA Size: 5.0 Number of attempts: 1 Airway Equipment and Method: Bite block Placement Confirmation: positive ETCO2 Tube secured with: Tape Dental Injury: Teeth and Oropharynx as per pre-operative assessment  Comments: Preoperatively discussed dental risks and airway concerns with patient. Reviewed all dental work and teeth condition : All front teeth loose - Incisors and first tricuspid, many missing. PreO2, mask ventilation, induction and intubation gentle and successful with teeth as preop condition. Bite block placed on left side molar area to protect ETT / teeth, BBS equal ETT taped .

## 2016-02-13 ENCOUNTER — Encounter (HOSPITAL_BASED_OUTPATIENT_CLINIC_OR_DEPARTMENT_OTHER): Payer: Self-pay | Admitting: Urology

## 2016-02-15 ENCOUNTER — Other Ambulatory Visit: Payer: Self-pay | Admitting: Emergency Medicine

## 2016-02-20 ENCOUNTER — Emergency Department (HOSPITAL_COMMUNITY)
Admission: EM | Admit: 2016-02-20 | Discharge: 2016-02-20 | Disposition: A | Discharge status: Home / Self Care | Payer: Medicare HMO | Attending: Emergency Medicine | Admitting: Emergency Medicine

## 2016-02-20 ENCOUNTER — Encounter (HOSPITAL_COMMUNITY): Payer: Self-pay | Admitting: Emergency Medicine

## 2016-02-20 DIAGNOSIS — Y828 Other medical devices associated with adverse incidents: Secondary | ICD-10-CM | POA: Insufficient documentation

## 2016-02-20 DIAGNOSIS — Z79899 Other long term (current) drug therapy: Secondary | ICD-10-CM | POA: Insufficient documentation

## 2016-02-20 DIAGNOSIS — I10 Essential (primary) hypertension: Secondary | ICD-10-CM | POA: Insufficient documentation

## 2016-02-20 DIAGNOSIS — M1990 Unspecified osteoarthritis, unspecified site: Secondary | ICD-10-CM | POA: Diagnosis not present

## 2016-02-20 DIAGNOSIS — Z48816 Encounter for surgical aftercare following surgery on the genitourinary system: Secondary | ICD-10-CM | POA: Diagnosis present

## 2016-02-20 DIAGNOSIS — T8131XA Disruption of external operation (surgical) wound, not elsewhere classified, initial encounter: Secondary | ICD-10-CM | POA: Diagnosis not present

## 2016-02-20 DIAGNOSIS — Z79891 Long term (current) use of opiate analgesic: Secondary | ICD-10-CM | POA: Diagnosis not present

## 2016-02-20 MED ORDER — CEPHALEXIN 500 MG PO CAPS: 500.0000 mg | ORAL_CAPSULE | Freq: Four times a day (QID) | ORAL | Status: DC

## 2016-02-20 NOTE — Discharge Instructions (Signed)
Please keep your wound clean and dry and change your dressing at least once per day, more often if it becomes wet or dirty. Follow-up with your urologist in the next couple of days for reevaluation and a wound recheck. Take your antibiotics as prescribed. Do not save or share them. Return to ED for any new or worsening symptoms as we discussed.  Wound Dehiscence Wound dehiscence is when a surgical cut (incision) opens up and does not heal properly. It usually happens 7-10 days after surgery. You may have bleeding from the cut. You may also have pain or a fever. This condition should be treated early. HOME CARE  Only take medicines as told by your doctor.  Take your antibiotic medicine as told. Finish it even if you start to feel better.  Wash your wound with warm, soapy water 2 times a day, or as told. Pat the wound dry. Do not rub the wound.  Change bandages (dressings) as often as told. Wash your hands before and after changing bandages. Apply bandages as told.  Take showers. Do not soak the wound, bathe, swim, or use a hot tub until directed by your doctor.  Avoid exercises that make you sweat.  Use medicines that stop itching as told by your doctor. The wound may itch as it heals. Do not pick or scratch at the wound.  Do not lift more than 10 pounds (4.5 kilograms) until the wound is healed, or as told by your doctor.  Keep all doctor visits as told. GET HELP IF:  You have a lot of bleeding from your surgical cut.  Your wound does not seem to be healing right.  You have a fever. GET HELP RIGHT AWAY IF:  You have more puffiness (swelling) or redness around the wound.  You have more pain in the wound.  You have yellowish white fluid (pus) coming from the wound.  More of the wound breaks open. MAKE SURE YOU:  Understand these instructions.  Will watch your condition.  Will get help right away if you are not doing well or get worse.   This information is not intended to  replace advice given to you by your health care provider. Make sure you discuss any questions you have with your health care provider.   Document Released: 07/31/2009 Document Revised: 09/02/2014 Document Reviewed: 04/19/2013 Elsevier Interactive Patient Education [employer].

## 2016-02-20 NOTE — ED Notes (Signed)
Patient reports surgery last Tuesday and incision began to bleed today. Denies new injury to same. Placed one towel over area. Unsure if bleeding is controlled, clothing is saturated with blood. Patient denies pain or urinary symptoms.

## 2016-02-20 NOTE — ED Notes (Addendum)
Patient presents from home 7 days status-post hydrocele repair surgery.  Patient states this morning he noted blood in his underwear.  Patient's underwear and pants are saturated with blood.  A surgical wound to the underside of patient's scrotum has dehisced approximately 3 cm.  Bleeding now controlled.

## 2016-02-20 NOTE — ED Notes (Signed)
Pt given instructions and expressed understanding. Signature pad unable to capture signature.

## 2016-02-20 NOTE — ED Provider Notes (Signed)
CSN: [id number]     Arrival date & time 02/20/16  1011 History   First MD Initiated Contact with Patient 02/20/16 1108     Chief Complaint  Patient presents with  . Post-op Problem     (Consider location/radiation/quality/duration/timing/severity/associated sxs/prior Treatment) HPI Jerry Chapman is a 65 y.o. male with history of hypertension, gout, prostate cancer, here for evaluation of postop problem. Patient had hydrocele surgery roughly 1 week ago. He reports today, he did not wear his jock strap and as he was getting out of his vehicle he noticed copious amounts of blood in his pants and on his shoes. He came to ED for further evaluation. Denies fevers, chills, difficulties urinating, diarrhea or constipation, No abdominal pain. He does report increased swelling in his scrotum with some tenderness. Nothing makes the problem better or worse. No other modifying factors. No anticoagulation   Past Medical History  Diagnosis Date  . Hypertension   . History of prostate cancer UROLOGIST--  DR Karsten Ro--  last PSA in 2014 (0.79)    dx 2004--  T1c, Gleason 3+3, PSA 10.5  --  completed IM radiation therapy 04/ 2006  . Arthritis, gouty     as of 02-07-2016  . Frequency of urination   . Right rotator cuff tear     per pt will be scheduling surgery this summer  . Bilateral hydrocele   . At risk for sleep apnea     STOP-BANG= 5      SENT TO PCP 02-07-2016   Past Surgical History  Procedure Laterality Date  . Circumcision N/A 07/05/2013    Procedure: CIRCUMCISION ADULT, aspiration of right hydrocele, meatal dilitation;  Surgeon: Claybon Jabs, MD;  Location: [facility];  Service: Urology;  Laterality: N/A;  . Hydrocele excision Bilateral 02/12/2016    Procedure: HYDROCELECTOMY ADULT;  Surgeon: Kathie Rhodes, MD;  Location: [facility];  Service: Urology;  Laterality: Bilateral;  . Cystoscopy  02/12/2016    Procedure: CYSTOSCOPY;  Surgeon: Kathie Rhodes, MD;   Location: [facility];  Service: Urology;;   Family History  Problem Relation Age of Onset  . Colon cancer Neg Hx   . Pancreatic cancer Neg Hx   . Stomach cancer Neg Hx   . Esophageal cancer Neg Hx    Social History  Substance Use Topics  . Smoking status: Never Smoker   . Smokeless tobacco: Never Used  . Alcohol Use: 1.0 oz/week    2 Standard drinks or equivalent per week    Review of Systems A 10 point review of systems was completed and was negative except for pertinent positives and negatives as mentioned in the history of present illness     Allergies  Review of patient's allergies indicates no known allergies.  Home Medications   Prior to Admission medications   Medication Sig Start Date End Date Taking? Authorizing Provider  acetaminophen (TYLENOL) 500 MG tablet Take 1,000 mg by mouth every 6 (six) hours as needed for mild pain.   Yes Historical Provider, MD  allopurinol (ZYLOPRIM) 300 MG tablet Take 1 tablet (300 mg total) by mouth daily. 07/16/15  Yes Chelle Jeffery, PA-C  amLODipine (NORVASC) 5 MG tablet Take 1 tablet (5 mg total) by mouth daily. 01/01/15  Yes Robyn Haber, MD  COLCRYS 0.6 MG tablet TAKE 1 TABLET(0.6 MG) BY MOUTH DAILY 02/15/16  Yes Chelle Jeffery, PA-C  Oxycodone HCl 10 MG TABS Take 1 tablet (10 mg total) by mouth every 4 (  four) hours as needed. Patient taking differently: Take 10 mg by mouth every 4 (four) hours as needed (For pain.).  02/12/16  Yes Kathie Rhodes, MD  cephALEXin (KEFLEX) 500 MG capsule Take 1 capsule (500 mg total) by mouth 4 (four) times daily. 02/20/16   Terril Amaro, PA-C   BP 165/94 mmHg  Pulse 74  Temp(Src) 99 F (37.2 C) (Oral)  Resp 16  SpO2 98% Physical Exam  Constitutional: He is oriented to person, place, and time. He appears well-developed and well-nourished.  HENT:  Head: Normocephalic and atraumatic.  Mouth/Throat: Oropharynx is clear and moist.  Eyes: Conjunctivae are normal. Pupils are equal,  round, and reactive to light. Right eye exhibits no discharge. Left eye exhibits no discharge. No scleral icterus.  Neck: Neck supple.  Cardiovascular: Normal rate, regular rhythm and normal heart sounds.   Pulmonary/Chest: Effort normal and breath sounds normal. No respiratory distress. He has no wheezes. He has no rales.  Abdominal: Soft. There is no tenderness.  Genitourinary:  Scrotum is grossly edematous. Midline surgical scar has dehisced with ruptured suture. Hemostasis achieved.  Musculoskeletal: He exhibits no tenderness.  Neurological: He is alert and oriented to person, place, and time.  Cranial Nerves II-XII grossly intact  Skin: Skin is warm and dry. No rash noted.  Psychiatric: He has a normal mood and affect.  Nursing note and vitals reviewed.   ED Course  Procedures (including critical care time) Labs Review Labs Reviewed - No data to display  Imaging Review No results found. I have personally reviewed and evaluated these images and lab results as part of my medical decision-making.   EKG Interpretation None      MDM  Patient with recent hydrocele surgery on 6/19 presents for wound dehiscence. Discussed with urology, Dr. Louis Meckel, recommends wound care, antibiotic therapy and follow-up in the office this week for wound recheck. Patient overall appears very well, nontoxic, hemodynamically stable, afebrile. Appropriate for outpatient follow-up. Discussed strict return precautions. He verbalizes understanding and agrees with this plan as well as subsequent discharge, voices no other questions or concerns. Final diagnoses:  Wound dehiscence, initial encounter       Comer Locket, PA-C 02/20/16 [place], MD 02/21/16 [phone]

## 2016-02-23 ENCOUNTER — Other Ambulatory Visit: Payer: Self-pay | Admitting: Physician Assistant

## 2016-02-23 ENCOUNTER — Ambulatory Visit (INDEPENDENT_AMBULATORY_CARE_PROVIDER_SITE_OTHER): Payer: Medicare HMO | Admitting: Physician Assistant

## 2016-02-23 VITALS — BP 132/90 | HR 93 | Temp 98.4°F | Resp 17 | Ht 69.5 in | Wt 235.0 lb

## 2016-02-23 DIAGNOSIS — E669 Obesity, unspecified: Secondary | ICD-10-CM

## 2016-02-23 DIAGNOSIS — N5089 Other specified disorders of the male genital organs: Secondary | ICD-10-CM

## 2016-02-23 MED ORDER — COLCHICINE 0.6 MG PO TABS: ORAL_TABLET | ORAL | Status: DC

## 2016-02-23 NOTE — Patient Instructions (Signed)
     IF you received an x-ray today, you will receive an invoice from [facility]. Please contact [facility] at [phone] with questions or concerns regarding your invoice.   IF you received labwork today, you will receive an invoice from [facility]. Please contact [facility] at [phone] with questions or concerns regarding your invoice.   Our billing staff will not be able to assist you with questions regarding bills from these companies.  You will be contacted with the lab results as soon as they are available. The fastest way to get your results is to activate your My Chart account. Instructions are located on the last page of this paperwork. If you have not heard from us regarding the results in 2 weeks, please contact this office.      

## 2016-02-23 NOTE — Progress Notes (Signed)
   02/23/2016 11:14 AM   DOB: 02-Oct-1950 / MRN: [id number]  SUBJECTIVE:  Jerry Chapman is a 65 y.o. male presenting for wound care and a refill of his Colcrys.  He has a history of gout. [facility] indicates he was recently seen by urology for hydrocelectomy on 02/12/16.  He was then seen on 6/27 in the ED for wound dehiscence. He presents today for evaluation of the wound.    He would like a refill of his colcrys and a referral to a sleep doctor as the anesthesia MD told him he most likely has sleep apnea.   He has No Known Allergies.   He  has a past medical history of Hypertension; History of prostate cancer (UROLOGIST--  DR Karsten Ro--  last PSA in 2014 (0.79)); Arthritis, gouty; Frequency of urination; Right rotator cuff tear; Bilateral hydrocele; and At risk for sleep apnea.    He  reports that he has never smoked. He has never used smokeless tobacco. He reports that he drinks about 1.0 oz of alcohol per week. He reports that he does not use illicit drugs. He  has no sexual activity history on file. The patient  has past surgical history that includes Circumcision (N/A, 07/05/2013); Hydrocele surgery (Bilateral, 02/12/2016); and Cystoscopy (02/12/2016).  His family history is negative for Colon cancer, Pancreatic cancer, Stomach cancer, and Esophageal cancer.  Review of Systems  Constitutional: Negative for fever and chills.  Gastrointestinal: Negative for nausea and vomiting.  Skin: Negative for itching and rash.  Neurological: Negative for dizziness and headaches.    Problem list and medications reviewed and updated by myself where necessary, and exist elsewhere in the encounter.   OBJECTIVE:  BP 132/90 mmHg  Pulse 93  Temp(Src) 98.4 F (36.9 C) (Oral)  Resp 17  Ht 5' 9.5" (1.765 m)  Wt 235 lb (106.595 kg)  BMI 34.22 kg/m2  SpO2 97%  Physical Exam  Constitutional: He is oriented to person, place, and time.  Cardiovascular: Normal rate and regular rhythm.   Pulmonary/Chest:  Effort normal and breath sounds normal.  Musculoskeletal: Normal range of motion.  Neurological: He is alert and oriented to person, place, and time.  Skin: Skin is warm and dry.  Vitals reviewed.        No results found for this or any previous visit (from the past 72 hour(s)).  No results found.  ASSESSMENT AND PLAN  Cedarius was seen today for medication refill and wound care.  Diagnoses and all orders for this visit:  Testicular swelling: He is status post hydrocellectomy and it is my opinion as well as Dr. Clayborn Heron that he needs to be seen today by Urology as this amount of swelling is not normal post surgery and he will likely need an ultrasound and potentially IV abx if infection is suspected.    The patient was advised to call or return to clinic if he does not see an improvement in symptoms or to seek the care of the closest emergency department if he worsens with the above plan.   Philis Fendt, MHS, PA-C [facility] 02/23/2016 11:14 AM

## 2016-02-26 ENCOUNTER — Encounter: Payer: Self-pay | Admitting: Physician Assistant

## 2016-02-26 DIAGNOSIS — R319 Hematuria, unspecified: Secondary | ICD-10-CM | POA: Insufficient documentation

## 2016-03-20 ENCOUNTER — Ambulatory Visit (INDEPENDENT_AMBULATORY_CARE_PROVIDER_SITE_OTHER): Payer: Medicare HMO | Admitting: Neurology

## 2016-03-20 ENCOUNTER — Encounter: Payer: Self-pay | Admitting: Neurology

## 2016-03-20 DIAGNOSIS — G4710 Hypersomnia, unspecified: Secondary | ICD-10-CM | POA: Diagnosis not present

## 2016-03-20 DIAGNOSIS — G4730 Sleep apnea, unspecified: Secondary | ICD-10-CM | POA: Diagnosis not present

## 2016-03-20 DIAGNOSIS — R0683 Snoring: Secondary | ICD-10-CM | POA: Diagnosis not present

## 2016-03-20 NOTE — Progress Notes (Signed)
SLEEP MEDICINE CLINIC   Provider:  Larey Seat, M D  Referring Provider: Orma Flaming, MD Primary Care Physician:  Kennon Portela, MD  Chief Complaint  Patient presents with  . New Patient (Initial Visit)    snores at night, never had sleep study, BP 184 108    HPI:  Jerry Chapman is a 65 y.o. male , seen here as a referral from Dr. Elder Cyphers for a sleep consultation.  Jerry Chapman underwent a urological surgery and was told by the anesthesiologist that he has a high risk of sleep apnea. After being in the recovery room he struggled to breath, had long apnoeic pauses.  The patient lives alone and there is nobody witnessing his sleep at home. He has been heavy for most of his adult life. He works regular daytime hours, is not shift working. He endorsed snoring, decreased level of energy, hematuria, male impotence, weight gain and fatigue on his review of systems. He is is a prostate cancer survivor was diagnosed 10 years ago. He has undergone a hydrocele a resection and a circumcision.He is currently taking amlodipine, colchicine for gouty arthritis, allopurinol and Aleve. He is a nonsmoker and drinks alcohol once a week, does not use illegal drugs, he drinks coffee one cup a day. His hobbies are restoration of classic cars.   He endorses the Epworth sleepiness score at 8 points and the fatigue severity at 55 points the geriatric depression score at 3 out of 15 points which is not significant.  Sleep habits are as follows: He goes to bed at 10 PM , he falls asleep very promptly. His bedroom is cool quiet and dark. He falls asleep on his side, he sleeps on 2 pillows. He usually sleeps through the night until 3 AM and he has a regular bathroom call at that time. After that is difficult for him to fall back asleep and stay asleep. The sleep after 3 AM is more fragmented. No vivid dreams, no palpitations and no diaphoresis. He is a loud snorer( told by different people, the loudest  in supine) . He will usually have 3 bathroom breaks each night since having undergone prostate cancer treatment. He rises in the morning at 6 AM, spontaneously/. He does not feel restored or refreshed enough and usually would crave another hour of sleep. He does neither wakes with a dry mouth nor with headaches noisy woken by headaches.He will get usually always 5 hours of sleep sometimes 6 or 7 hours. He will have breakfast at home and a couple of coffee before commuting to work. He works as a Equities trader, spends a lot of his time outside and was full daylight exposure. He keeps hydrating well,   Jerry Chapman. reports that he often takes afternoon naps these may last an hour or less and they feel more refreshing to him and his nocturnal sleep. He usually takes a nap in a car seat in a reclined position. It has been frequently the case and his Epworth would be much higher if he wouldn't nap !   Sleep medical history and family sleep history:  No family history .  The patient was not sleepier than other children when he compared his childhood, he was not a sleep walker, no enuresis, no night terrors. He still has his tonsils remaining. There has been no history of traumatic brain injury or ENT surgery.  Social history: caffeine, non smoker, ETOH once a week. Divorced with children.   Review of  Systems: Out of a complete 14 system review, the patient complains of only the following symptoms, and all other reviewed systems are negative.  Epworth score 8 ( 13)  , Fatigue severity score 55  , depression score 3/15   Social History   Social History  . Marital status: Divorced    Spouse name: N/A  . Number of children: N/A  . Years of education: N/A   Occupational History  . Contractor    Social History Main Topics  . Smoking status: Never Smoker  . Smokeless tobacco: Never Used  . Alcohol use 1.0 oz/week    2 Standard drinks or equivalent per week  . Drug use: No  . Sexual activity:  Not on file   Other Topics Concern  . 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    Lives at home byself    Family History  Problem Relation Age of Onset  . Colon cancer Neg Hx   . Pancreatic cancer Neg Hx   . Stomach cancer Neg Hx   . Esophageal cancer Neg Hx     Past Medical History:  Diagnosis Date  . Arthritis, gouty    as of 02-07-2016  . At risk for sleep apnea    STOP-BANG= 5      SENT TO PCP 02-07-2016  . Bilateral hydrocele   . Frequency of urination   . History of prostate cancer UROLOGIST--  DR Karsten Ro--  last PSA in 2014 (0.79)   dx 2004--  T1c, Gleason 3+3, PSA 10.5  --  completed IM radiation therapy 04/ 2006  . Hypertension   . Right rotator cuff tear    per pt will be scheduling surgery this summer    Past Surgical History:  Procedure Laterality Date  . CIRCUMCISION N/A 07/05/2013   Procedure: CIRCUMCISION ADULT, aspiration of right hydrocele, meatal dilitation;  Surgeon: Claybon Jabs, MD;  Location: [facility];  Service: Urology;  Laterality: N/A;  . CYSTOSCOPY  02/12/2016   Procedure: CYSTOSCOPY;  Surgeon: Kathie Rhodes, MD;  Location: [facility];  Service: Urology;;  . Carollee Herter EXCISION Bilateral 02/12/2016   Procedure: HYDROCELECTOMY ADULT;  Surgeon: Kathie Rhodes, MD;  Location: [facility];  Service: Urology;  Laterality: Bilateral;    Current Outpatient Prescriptions  Medication Sig Dispense Refill  . allopurinol (ZYLOPRIM) 300 MG tablet Take 1 tablet (300 mg total) by mouth daily. 90 tablet 3  . amLODipine (NORVASC) 5 MG tablet Take 1 tablet (5 mg total) by mouth daily. 90 tablet 3  . colchicine (COLCRYS) 0.6 MG tablet Take two tabs and then another in one hour.  Okay to repeat this for 3 days total. 30 tablet 0  . Naproxen Sodium (ALEVE PO) Take 480 mg by mouth.     No current facility-administered medications for this visit.     Allergies as of  03/20/2016  . (No Known Allergies)    Vitals: BP (!) 184/108   Pulse 78   Resp 20   Ht 5\' 9"  (1.753 m)   Wt 233 lb (105.7 kg)   BMI 34.41 kg/m  Last Weight:  Wt Readings from Last 1 Encounters:  03/20/16 233 lb (105.7 kg)   [id number] mass index is 34.41 kg/m.     Last Height:   Ht Readings from Last 1 Encounters:  03/20/16 5\' 9"  (1.753 m)    Physical exam:  General: The patient is  awake, alert and appears not in acute distress. The patient is well groomed. Head: Normocephalic, atraumatic. Neck is supple. Mallampati 3,  neck circumference:19.5 .  Nasal airflow patent  TMJ is not  evident . Retrognathia is seen.  Cardiovascular:  Regular rate and rhythm , without  murmurs or carotid bruit, and without distended neck veins. Respiratory: Lungs are clear to auscultation. Skin:  Without evidence of edema, or rash Trunk: BMI is 35. The patient's posture is erect .  Neurologic exam : The patient is awake and alert, oriented to place and time.   Memory subjective described as intact.  Attention span & concentration ability appears normal.  Speech is fluent,  without dysarthria, dysphonia or aphasia.  Mood and affect are appropriate.  Cranial nerves: Pupils are equal and briskly reactive to light. Funduscopic exam without  evidence of pallor or edema. Extraocular movements  in vertical and horizontal planes intact and without nystagmus. Visual fields by finger perimetry are intact. Hearing to finger rub intact.   Facial sensation intact to fine touch.  Facial motor strength is symmetric and tongue and uvula move midline. Shoulder shrug was symmetrical.   Motor exam: Normal tone, muscle bulk and symmetric strength in all extremities. Sensory:  Fine touch, pinprick and vibration were tested in all extremities. Coordination: Rapid alternating movements in the fingers/hands was normal. Finger-to-nose maneuver  normal without evidence of ataxia, dysmetria or tremor. Gait and station:  Patient walks without assistive device and is able unassisted to climb up to the exam table. Strength within normal limits.  Stance is stable and normal.  Turns with 3 Steps. Romberg testing is negative.  Deep tendon reflexes: in the  upper and lower extremities are symmetric and intact. Babinski maneuver response is downgoing.  The patient was advised of the nature of the diagnosed sleep disorder , the treatment options and risks for general a health and wellness arising from not treating the condition.  I spent more than 35 minutes of face to face time with the patient. Greater than 50% of time was spent in counseling and coordination of care. We have discussed the diagnosis and differential and I answered the patient's questions.     Assessment:  After physical and neurologic examination, review of laboratory studies,  Personal review of imaging studies, reports of other /same  Imaging studies ,  Results of polysomnography/ neurophysiology testing and pre-existing records as far as provided in visit., my assessment is    Dear Dr. Elder Cyphers. Thank you so much for allowing me to participate in this pleasant gentleman's sleep care, As you know  Jerry Chapman has visible risk factors for obstructive sleep apnea including truncal obesity, a larger neck and a medium high-grade Mallampati.   I do suspect that he would be a snorer especially in supine position and he has been witnessed to snore. Postsurgically apnea has been witnessed. For this reason we will order a split night polysomnography for this patient. In addition I hope that his nocturia may be reduced obstructive sleep apnea can be identified and treated successfully. If this patient has a mild degree of apnea without significant oxygen desaturation I would prefer a dental device to treat. He has his biological teeth so a dental mandibular advancement can be used in the treatment of snoring and mild apnea. He is aware that weight is a major risk  factor, he is physically active but mostly exercises for cardio stability. He may want to add some exercises for core strength.  Rv  after sleep study    Larey Seat MD  03/20/2016   CC: Orma Flaming, [address] [place], [place] [address]

## 2016-03-31 ENCOUNTER — Other Ambulatory Visit: Payer: Self-pay | Admitting: Family Medicine

## 2016-04-19 ENCOUNTER — Telehealth: Payer: Self-pay | Admitting: Diagnostic Neuroimaging

## 2016-04-19 NOTE — Telephone Encounter (Signed)
Split night permitted by [facility]

## 2016-04-19 NOTE — Telephone Encounter (Signed)
Dr. Shellia Cleverly calling regarding peer-peer review for Dr. Brett Fairy. Please call [phone]. [id number]. -VRP

## 2016-04-30 ENCOUNTER — Other Ambulatory Visit: Payer: Self-pay | Admitting: Urgent Care

## 2016-05-04 ENCOUNTER — Telehealth: Payer: Self-pay

## 2016-05-04 DIAGNOSIS — E669 Obesity, unspecified: Secondary | ICD-10-CM

## 2016-05-04 DIAGNOSIS — M1000 Idiopathic gout, unspecified site: Secondary | ICD-10-CM

## 2016-05-04 NOTE — Telephone Encounter (Signed)
Patient was seen on 03/20/16 and states that his most recent refill request was denied. Patient is now requesting a refill for amplodipine, colchicine, and allopurinol. [phone]  [facility] on [address]

## 2016-05-06 ENCOUNTER — Other Ambulatory Visit: Payer: Self-pay | Admitting: Physician Assistant

## 2016-05-06 ENCOUNTER — Other Ambulatory Visit: Payer: Self-pay | Admitting: Urgent Care

## 2016-05-06 DIAGNOSIS — E669 Obesity, unspecified: Secondary | ICD-10-CM

## 2016-05-07 NOTE — Telephone Encounter (Signed)
Spoke with patient he states he ws seen in 7/16 wants his prescription refilled.  I explained that he wasn't seen for this particular problem and would need and appointment.  Patient states he was told all his prescriptions would be filled at this visit.  Do not see this in note.      Do you want patient to RTC

## 2016-05-09 ENCOUNTER — Ambulatory Visit: Payer: Medicare HMO

## 2016-05-09 ENCOUNTER — Ambulatory Visit (INDEPENDENT_AMBULATORY_CARE_PROVIDER_SITE_OTHER): Payer: Medicare HMO | Admitting: Neurology

## 2016-05-09 DIAGNOSIS — G4733 Obstructive sleep apnea (adult) (pediatric): Secondary | ICD-10-CM

## 2016-05-09 DIAGNOSIS — R0683 Snoring: Secondary | ICD-10-CM

## 2016-05-09 DIAGNOSIS — G4710 Hypersomnia, unspecified: Secondary | ICD-10-CM

## 2016-05-09 DIAGNOSIS — G4730 Sleep apnea, unspecified: Secondary | ICD-10-CM

## 2016-05-09 MED ORDER — COLCHICINE 0.6 MG PO TABS: ORAL_TABLET | ORAL | 0 refills | Status: DC

## 2016-05-09 MED ORDER — ALLOPURINOL 300 MG PO TABS: 300.0000 mg | ORAL_TABLET | Freq: Every day | ORAL | 0 refills | Status: DC

## 2016-05-09 MED ORDER — AMLODIPINE BESYLATE 5 MG PO TABS
5.0000 mg | ORAL_TABLET | Freq: Every day | ORAL | 0 refills | Status: DC
Start: 2016-05-09 — End: 2016-06-11

## 2016-05-09 NOTE — Telephone Encounter (Signed)
Meds ordered this encounter  Medications  . amLODipine (NORVASC) 5 MG tablet    Sig: Take 1 tablet (5 mg total) by mouth daily.    Dispense:  30 tablet    Refill:  0    PATIENT NEEDS BLOOD PRESSURE CHECK UP/ LABS FOR ADDITIONAL REFILLS    Order Specific Question:   Supervising Provider    Answer:   Brigitte Pulse, EVA N [[id number]]  . allopurinol (ZYLOPRIM) 300 MG tablet    Sig: Take 1 tablet (300 mg total) by mouth daily.    Dispense:  30 tablet    Refill:  0    Order Specific Question:   Supervising Provider    Answer:   SHAW, EVA N [[id number]]  . colchicine (COLCRYS) 0.6 MG tablet    Sig: TAKE 2 TABLETS AND THEN ANOTHER IN 1 HOUR. OKAY TO REPEAT THIS FOR 3 DAYS TOTAL.    Dispense:  30 tablet    Refill:  0    Order Specific Question:   Supervising Provider    Answer:   Shawnee Knapp [[id number]]    Patient notified in person. Needs OV and labs for additional fills.

## 2016-05-28 ENCOUNTER — Telehealth: Payer: Self-pay

## 2016-05-28 DIAGNOSIS — G4733 Obstructive sleep apnea (adult) (pediatric): Secondary | ICD-10-CM

## 2016-05-28 NOTE — Telephone Encounter (Signed)
I spoke to pt regarding his sleep study results. I advised him that his sleep study revealed moderate osa and that Dr. Brett Fairy recommends starting a cpap. Pt is agreeable to this. I advised him that the order for a cpap will be sent to a DME. I advised pt to avoid sedative-hypnotics which may worsen sleep apnea, alcohol and tobacco.  I advised pt to lose weight, diet, and exercise if not contraindicated by his other physicians. Pt verbalized understanding of results. Pt made a follow up appt for 08/05/2016. Will send to [facility]. Pt had no questions at this time but was encouraged to call back if questions arise.

## 2016-06-05 ENCOUNTER — Encounter: Payer: Self-pay | Admitting: Internal Medicine

## 2016-06-11 ENCOUNTER — Ambulatory Visit (INDEPENDENT_AMBULATORY_CARE_PROVIDER_SITE_OTHER): Payer: Medicare HMO | Admitting: Family Medicine

## 2016-06-11 VITALS — BP 140/92 | HR 73 | Temp 98.3°F | Resp 17 | Ht 69.0 in | Wt 220.0 lb

## 2016-06-11 DIAGNOSIS — M1000 Idiopathic gout, unspecified site: Secondary | ICD-10-CM

## 2016-06-11 DIAGNOSIS — L309 Dermatitis, unspecified: Secondary | ICD-10-CM | POA: Diagnosis not present

## 2016-06-11 DIAGNOSIS — I10 Essential (primary) hypertension: Secondary | ICD-10-CM

## 2016-06-11 DIAGNOSIS — E6609 Other obesity due to excess calories: Secondary | ICD-10-CM

## 2016-06-11 DIAGNOSIS — Z6832 Body mass index (BMI) 32.0-32.9, adult: Secondary | ICD-10-CM | POA: Diagnosis not present

## 2016-06-11 DIAGNOSIS — M1A09X0 Idiopathic chronic gout, multiple sites, without tophus (tophi): Secondary | ICD-10-CM

## 2016-06-11 DIAGNOSIS — Z23 Encounter for immunization: Secondary | ICD-10-CM | POA: Diagnosis not present

## 2016-06-11 MED ORDER — COLCHICINE 0.6 MG PO TABS: ORAL_TABLET | ORAL | 0 refills | Status: DC

## 2016-06-11 MED ORDER — AMLODIPINE BESYLATE 5 MG PO TABS: 5.0000 mg | ORAL_TABLET | Freq: Every day | ORAL | 0 refills | Status: DC

## 2016-06-11 MED ORDER — ALLOPURINOL 300 MG PO TABS: 300.0000 mg | ORAL_TABLET | Freq: Every day | ORAL | 0 refills | Status: DC

## 2016-06-11 MED ORDER — KETOCONAZOLE 200 MG PO TABS: 200.0000 mg | ORAL_TABLET | Freq: Every day | ORAL | 0 refills | Status: DC

## 2016-06-11 MED ORDER — KETOCONAZOLE 2 % EX SHAM: 1.0000 "application " | MEDICATED_SHAMPOO | CUTANEOUS | 6 refills | Status: DC

## 2016-06-11 NOTE — Progress Notes (Signed)
Chief Complaint  Patient presents with  . Medication Refill    Allopurinol, amlodipine, colchicine/ referred to Dr. Tamala Julian by Dr. Linna Darner  . Rash    needs med he has had in the past/ nizoral    HPI  Patient to establish care.  Gout Pt reports that his gout has been well controlled on his medications. He reports that he eats everything in moderation and does not know if he has any triggers. He denies any side effects of the allopurinol and colchine. He gets gout attacks about once a year in the elbow and the foot. Lab Results  Component Value Date   CREATININE 1.10 02/12/2016    Hypertension: Patient here for follow-up of elevated blood pressure. He is not exercising and is adherent to low salt diet.  Blood pressure is well controlled at home. Cardiac symptoms none. Patient denies chest pain, irregular heart beat, lower extremity edema and palpitations.  Cardiovascular risk factors: advanced age (older than 19 for men, 20 for women), hypertension and obesity (BMI >= 30 kg/m2). Use of agents associated with hypertension: none. History of target organ damage: none.   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  Past Medical History:  Diagnosis Date  . Arthritis, gouty    as of 02-07-2016  . At risk for sleep apnea    STOP-BANG= 5      SENT TO PCP 02-07-2016  . Bilateral  hydrocele   . Frequency of urination   . History of prostate cancer UROLOGIST--  DR Karsten Ro--  last PSA in 2014 (0.79)   dx 2004--  T1c, Gleason 3+3, PSA 10.5  --  completed IM radiation therapy 04/ 2006  . Hypertension   . Right rotator cuff tear    per pt will be scheduling surgery this summer    Current Outpatient Prescriptions  Medication Sig Dispense Refill  . allopurinol (ZYLOPRIM) 300 MG tablet Take 1 tablet (300 mg total) by mouth daily. 30 tablet 0  . amLODipine (NORVASC) 5 MG tablet Take 1 tablet (5 mg total) by mouth daily. 30 tablet 0  . colchicine (COLCRYS) 0.6 MG tablet TAKE 2 TABLETS AND THEN ANOTHER IN 1 HOUR. OKAY TO REPEAT THIS FOR 3 DAYS TOTAL. 30 tablet 0  . Naproxen Sodium (ALEVE PO) Take 480 mg by mouth.    Derrill Memo ON 06/13/2016] ketoconazole (NIZORAL) 2 % shampoo Apply 1 application topically 2 (two) times a week. 120 mL 6  . ketoconazole (NIZORAL) 200 MG tablet Take 1 tablet (200 mg total) by mouth daily. Do not take ketoconazole and Colchicine together. Hold the Colchicine. 7 tablet 0   No current facility-administered medications for this visit.     Allergies: No Known Allergies  Past Surgical History:  Procedure Laterality Date  . CIRCUMCISION N/A 07/05/2013   Procedure: CIRCUMCISION ADULT, aspiration of right hydrocele,  meatal dilitation;  Surgeon: Claybon Jabs, MD;  Location: [facility];  Service: Urology;  Laterality: N/A;  . CYSTOSCOPY  02/12/2016   Procedure: CYSTOSCOPY;  Surgeon: Kathie Rhodes, MD;  Location: [facility];  Service: Urology;;  . Carollee Herter EXCISION Bilateral 02/12/2016   Procedure: HYDROCELECTOMY ADULT;  Surgeon: Kathie Rhodes, MD;  Location: [facility];  Service: Urology;  Laterality: Bilateral;    Social History   Social History  . Marital status: Divorced    Spouse name: N/A  . Number of children: N/A  . Years of education: N/A   Occupational History  . Contractor    Social  History Main Topics  . Smoking status: Never Smoker  . Smokeless tobacco: Never Used  . Alcohol use 1.0 oz/week    2 Standard drinks or equivalent per week  . Drug use: No  . Sexual activity: Not Asked   Other Topics Concern  . 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    Lives at home byself    ROS See HPI   Objective: Vitals:   06/11/16 0804  BP: (!) 140/92  Pulse: 73  Resp: 17  Temp: 98.3 F (36.8 C)  TempSrc: Oral  SpO2: 98%  Weight: 220 lb (99.8 kg)  Height: 5\' 9"  (1.753 m)    Physical Exam  Constitutional: He appears well-developed and well-nourished.  HENT:  Head: Normocephalic and atraumatic.  Eyes: Conjunctivae and EOM are normal.  Cardiovascular: Normal rate, regular rhythm and normal heart sounds.   No murmur heard. Pulmonary/Chest: Effort normal and breath sounds normal. No respiratory distress.  Musculoskeletal: Normal range of motion. He exhibits no edema.  Skin:  Erythematous flat rash on torso, salmon colored, scaly near the edges    Assessment and Plan Hattan was seen today for medication refill and rash.  Diagnoses and all orders for this visit:  Need for prophylactic vaccination and inoculation against influenza -     Flu Vaccine QUAD 36+ mos IM  Class 1 obesity due to excess calories without serious comorbidity with body mass index (BMI) of 32.0 to 32.9 in adult -  Improved with dieting and herbalife  Idiopathic chronic gout of multiple sites without tophus- on preventative regimen with allopurinol and colchicine Continue weight loss Renal function in good range   Essential hypertension- bp well controlled Continue amlodipine Last creatinine in good range  Acute idiopathic gout, unspecified site -     allopurinol (ZYLOPRIM) 300 MG tablet; Take 1 tablet (300 mg total) by mouth daily.  Dermatitis - most likely fungal Discussed that he should only take ketoconazole for one week. During that  week he should not use colchicine.  He should start ketoconazole shampoo as maintenance twice a week  Other orders -     amLODipine (NORVASC) 5 MG tablet; Take 1 tablet (5 mg total) by mouth daily. -     colchicine (COLCRYS) 0.6 MG tablet; TAKE 2 TABLETS AND THEN ANOTHER IN 1 HOUR. OKAY TO REPEAT THIS FOR 3 DAYS TOTAL. -     ketoconazole (NIZORAL) 2 % shampoo; Apply 1 application topically 2 (two) times a week. -     ketoconazole (NIZORAL) 200 MG tablet; Take 1 tablet (200 mg total) by mouth daily. Do not take ketoconazole and Colchicine together. Hold the Colchicine.     [place]

## 2016-06-11 NOTE — Patient Instructions (Addendum)
IF you received an x-ray today, you will receive an invoice from [facility]. Please contact [facility] at [phone] with questions or concerns regarding your invoice.   IF you received labwork today, you will receive an invoice from [employer]. Please contact [facility] at [phone] with questions or concerns regarding your invoice.   Our billing staff will not be able to assist you with questions regarding bills from these companies.  You will be contacted with the lab results as soon as they are available. The fastest way to get your results is to activate your My Chart account. Instructions are located on the last page of this paperwork. If you have not heard from [place] regarding the results in 2 weeks, please contact this office.     How to Take Your Blood Pressure HOW DO I GET A BLOOD PRESSURE MACHINE?  You can buy an electronic home blood pressure machine at your local pharmacy. Insurance will sometimes cover the cost if you have a prescription.  Ask your doctor what type of machine is best for you. There are different machines for your arm and your wrist.  If you decide to buy a machine to check your blood pressure on your arm, first check the size of your arm so you can buy the right size cuff. To check the size of your arm:   Use a measuring tape that shows both inches and centimeters.   Wrap the measuring tape around the upper-middle part of your arm. You may need someone to help you measure.   Write down your arm measurement in both inches and centimeters.   To measure your blood pressure correctly, it is important to have the right size cuff.   If your arm is up to 13 inches (up to 34 centimeters), get an adult cuff size.  If your arm is 13 to 17 inches (35 to 44 centimeters), get a large adult cuff size.    If your arm is 17 to 20 inches (45 to 52 centimeters), get an adult thigh cuff.  WHAT DO THE NUMBERS  MEAN?   There are two numbers that make up your blood pressure. For example: 120/80.  The first number (120 in our example) is called the "systolic pressure." It is a measure of the pressure in your blood vessels when your heart is pumping blood.  The second number (80 in our example) is called the "diastolic pressure." It is a measure of the pressure in your blood vessels when your heart is resting between beats.  Your doctor will tell you what your blood pressure should be. WHAT SHOULD I DO BEFORE I CHECK MY BLOOD PRESSURE?   Try to rest or relax for at least 30 minutes before you check your blood pressure.  Do not smoke.  Do not have any drinks with caffeine, such as:  Soda.  Coffee.  Tea.  Check your blood pressure in a quiet room.  Sit down and stretch out your arm on a table. Keep your arm at about the level of your heart. Let your arm relax.  Make sure that your legs are not crossed. HOW DO I CHECK MY BLOOD PRESSURE?  Follow the directions that came with your machine.  Make sure you remove any tight-fitting clothing from your arm or wrist. Wrap the cuff around your upper arm or wrist. You should be able to fit a finger between the cuff and your arm. If you cannot fit a  finger between the cuff and your arm, it is too tight and should be removed and rewrapped.  Some units require you to manually pump up the arm cuff.  Automatic units inflate the cuff when you press a button.  Cuff deflation is automatic in both models.  After the cuff is inflated, the unit measures your blood pressure and pulse. The readings are shown on a monitor. Hold still and breathe normally while the cuff is inflated.  Getting a reading takes less than a minute.  Some models store readings in a memory. Some provide a printout of readings. If your machine does not store your readings, keep a written record.  Take readings with you to your next visit with your doctor.   This information is not  intended to replace advice given to you by your health care provider. Make sure you discuss any questions you have with your health care provider.   Document Released: 07/25/2008 Document Revised: 09/02/2014 Document Reviewed: 10/07/2013 Elsevier Interactive Patient Education [employer].

## 2016-07-10 ENCOUNTER — Telehealth: Payer: Self-pay | Admitting: Family Medicine

## 2016-07-10 DIAGNOSIS — M1000 Idiopathic gout, unspecified site: Secondary | ICD-10-CM

## 2016-07-10 NOTE — Telephone Encounter (Signed)
[facility] wants you to fax RX of norvasc nizoral shampoo nizoral 200mg   And Zyloprim to be faxed [facility] [phone] or call into [phone]

## 2016-07-11 MED ORDER — AMLODIPINE BESYLATE 5 MG PO TABS: 5.0000 mg | ORAL_TABLET | Freq: Every day | ORAL | 2 refills | Status: DC

## 2016-07-11 MED ORDER — ALLOPURINOL 300 MG PO TABS: 300.0000 mg | ORAL_TABLET | Freq: Every day | ORAL | 0 refills | Status: DC

## 2016-07-11 MED ORDER — KETOCONAZOLE 2 % EX SHAM: 1.0000 "application " | MEDICATED_SHAMPOO | CUTANEOUS | 6 refills | Status: DC

## 2016-07-11 NOTE — Telephone Encounter (Signed)
Patient should have completed the nizoral tablets. The shampoo, amlodipine and allopurinol were sent in. Scripts printed.

## 2016-07-11 NOTE — Telephone Encounter (Signed)
faxed

## 2016-07-21 ENCOUNTER — Other Ambulatory Visit: Payer: Self-pay | Admitting: Family Medicine

## 2016-07-24 NOTE — Telephone Encounter (Signed)
05/2016 last ov 

## 2016-07-29 ENCOUNTER — Other Ambulatory Visit: Payer: Self-pay

## 2016-07-29 DIAGNOSIS — M1000 Idiopathic gout, unspecified site: Secondary | ICD-10-CM

## 2016-07-29 MED ORDER — AMLODIPINE BESYLATE 5 MG PO TABS: 5.0000 mg | ORAL_TABLET | Freq: Every day | ORAL | 2 refills | Status: DC

## 2016-07-29 MED ORDER — KETOCONAZOLE 2 % EX SHAM
1.0000 | MEDICATED_SHAMPOO | CUTANEOUS | 1 refills | Status: DC
Start: 2016-07-29 — End: 2018-10-05

## 2016-07-29 MED ORDER — ALLOPURINOL 300 MG PO TABS: 300.0000 mg | ORAL_TABLET | Freq: Every day | ORAL | 0 refills | Status: DC

## 2016-08-05 ENCOUNTER — Encounter: Payer: Self-pay | Admitting: Neurology

## 2016-08-05 ENCOUNTER — Telehealth: Payer: Self-pay

## 2016-08-05 ENCOUNTER — Ambulatory Visit (INDEPENDENT_AMBULATORY_CARE_PROVIDER_SITE_OTHER): Payer: Medicare HMO | Admitting: Neurology

## 2016-08-05 VITALS — BP 192/106 | HR 64 | Resp 18 | Ht 69.0 in | Wt 219.0 lb

## 2016-08-05 DIAGNOSIS — R0902 Hypoxemia: Secondary | ICD-10-CM

## 2016-08-05 DIAGNOSIS — M1000 Idiopathic gout, unspecified site: Secondary | ICD-10-CM

## 2016-08-05 DIAGNOSIS — G4733 Obstructive sleep apnea (adult) (pediatric): Secondary | ICD-10-CM | POA: Diagnosis not present

## 2016-08-05 NOTE — Progress Notes (Signed)
SLEEP MEDICINE CLINIC   Provider:  Larey Chapman, M D  Referring Provider: Orma Flaming, MD Primary Care Physician:  Jerry Moron, MD  Chief Complaint  Patient presents with  . Sleep Apnea    Rm 10. Patient is here to discuss sleep study and needs to get set up on CPAP machine.     HPI:  Jerry Chapman is a 65 y.o. male , seen here as a referral from Dr. Elder Chapman for a sleep consultation.  Jerry Chapman underwent a urological surgery and was told by the anesthesiologist that he has a high risk of sleep apnea. After being in the recovery room he struggled to breath, had long apnoeic pauses.  The patient lives alone and there is nobody witnessing his sleep at home. He has been heavy for most of his adult life. He works regular daytime hours, is not shift working. He endorsed snoring, decreased level of energy, hematuria, male impotence, weight gain and fatigue on his review of systems. He is is a prostate cancer survivor was diagnosed 10 years ago. He has undergone a hydrocele a resection and a circumcision.He is currently taking amlodipine, colchicine for gouty arthritis, allopurinol and Aleve. He is a nonsmoker and drinks alcohol once a week, does not use illegal drugs, he drinks coffee one cup a day. His hobbies are restoration of classic cars.  He endorses the Epworth sleepiness score at 8 points and the fatigue severity at 55 points the geriatric depression score at 3 out of 15 points which is not significant.  Sleep habits are as follows: He goes to bed at 10 PM , he falls asleep very promptly. His bedroom is cool quiet and dark. He falls asleep on his side, he sleeps on 2 pillows. He usually sleeps through the night until 3 AM and he has a regular bathroom call at that time. After that is difficult for him to fall back asleep and stay asleep. The sleep after 3 AM is more fragmented. No vivid dreams, no palpitations and no diaphoresis. He is a loud snorer( told by different  people, the loudest in supine) . He will usually have 3 bathroom breaks each night since having undergone prostate cancer treatment. He rises in the morning at 6 AM, spontaneously/. He does not feel restored or refreshed enough and usually would crave another hour of sleep. He does neither wakes with a dry mouth nor with headaches noisy woken by headaches.He will get usually always 5 hours of sleep sometimes 6 or 7 hours. He will have breakfast at home and a couple of coffee before commuting to work. He works as a Equities trader, spends a lot of his time outside and was full daylight exposure. He keeps hydrating well,  Jerry Chapman. reports that he often takes afternoon naps these may last an hour or less and they feel more refreshing to him and his nocturnal sleep. He usually takes a nap in a car Chapman in a reclined position. It has been frequently the case and his Epworth would be much higher if he wouldn't nap !   Sleep medical history and family sleep history: No family history . The patient was not sleepier than other children when he compared his childhood, he was not a sleep walker, no enuresis, no night terrors.He still has his tonsils remaining. There has been no history of traumatic brain injury or ENT surgery. Social history: caffeine, non smoker, ETOH once a week. Divorced with children.   Interval  history from 08/05/2016. As the pleasure of seeing Jerry Chapman , who underwent a split-night polysomnography on September 14 of this year. His AHI was 23.4 during REM sleep accentuated to 43.3 events per hour and in supine sleep his AHI was 51 per hour. He was titrated to CPAP at 10 cm water pressure with 2 cm EPR did not develop periodic limb movements and CPAP also treated all oxygen desaturation. He got a call from his insurance company stating that his CPAP was approved but has not received the CPAP machine yet.  Review of Systems: Out of a complete 14 system review, the patient complains of  only the following symptoms, and all other reviewed systems are negative.  Epworth score 8 ( 13)  , Fatigue severity score 55  , depression score 3/15   Social History   Social History  . Marital status: Divorced    Spouse name: N/A  . Number of children: N/A  . Years of education: N/A   Occupational History  . Contractor    Social History Main Topics  . Smoking status: Never Smoker  . Smokeless tobacco: Never Used  . Alcohol use 1.0 oz/week    2 Standard drinks or equivalent per week  . Drug use: No  . Sexual activity: Not on file   Other Topics Concern  . 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    Lives at home byself    Family History  Problem Relation Age of Onset  . Colon cancer Neg Hx   . Pancreatic cancer Neg Hx   . Stomach cancer Neg Hx   . Esophageal cancer Neg Hx     Past Medical History:  Diagnosis Date  . Arthritis, gouty    as of 02-07-2016  . At risk for sleep apnea    STOP-BANG= 5      SENT TO PCP 02-07-2016  . Bilateral hydrocele   . Frequency of urination   . History of prostate cancer UROLOGIST--  DR Jerry Chapman--  last PSA in 2014 (0.79)   dx 2004--  T1c, Gleason 3+3, PSA 10.5  --  completed IM radiation therapy 04/ 2006  . Hypertension   . Right rotator cuff tear    per pt will be scheduling surgery this summer    Past Surgical History:  Procedure Laterality Date  . CIRCUMCISION N/A 07/05/2013   Procedure: CIRCUMCISION ADULT, aspiration of right hydrocele, meatal dilitation;  Surgeon: Jerry Jabs, MD;  Location: [facility];  Service: Urology;  Laterality: N/A;  . CYSTOSCOPY  02/12/2016   Procedure: CYSTOSCOPY;  Surgeon: Jerry Rhodes, MD;  Location: [facility];  Service: Urology;;  . Carollee Herter EXCISION Bilateral 02/12/2016   Procedure: HYDROCELECTOMY ADULT;  Surgeon: Jerry Rhodes, MD;  Location: [facility];  Service: Urology;  Laterality:  Bilateral;    Current Outpatient Prescriptions  Medication Sig Dispense Refill  . allopurinol (ZYLOPRIM) 300 MG tablet Take 1 tablet (300 mg total) by mouth daily. 30 tablet 0  . amLODipine (NORVASC) 5 MG tablet Take 1 tablet (5 mg total) by mouth daily. 90 tablet 2  . COLCRYS 0.6 MG tablet TAKE 2 TABLETS AND THEN ANOTHER IN 1 HOUR, OKAY TO REPEAT THIS FOR 3 DAYS TOTAL 30 tablet 2  . ketoconazole (NIZORAL) 2 % shampoo Apply 1 application topically 2 (two) times a week. 360 mL 1  . Naproxen Sodium (ALEVE PO) Take 480 mg  by mouth.     No current facility-administered medications for this visit.     Allergies as of 08/05/2016  . (No Known Allergies)    Vitals: BP (!) 192/106   Pulse 64   Resp 18   Ht 5\' 9"  (1.753 m)   Wt 219 lb (99.3 kg)   BMI 32.34 kg/m  Last Weight:  Wt Readings from Last 1 Encounters:  08/05/16 219 lb (99.3 kg)   [id number] mass index is 32.34 kg/m.     Last Height:   Ht Readings from Last 1 Encounters:  08/05/16 5\' 9"  (1.753 m)    Physical exam:  General: The patient is awake, alert and appears not in acute distress. The patient is well groomed. Head: Normocephalic, atraumatic. Neck is supple. Mallampati 3,  neck circumference:19.5 .  Skin:  Without evidence of edema, or rash Trunk: BMI is 35. The patient's posture is erect .  Neurologic exam : The patient is awake and alert, oriented to place and time.   Cranial nerves: Pupils are equal and briskly reactive to light. Funduscopic exam without  evidence of pallor or edema. Extraocular movements  in vertical and horizontal planes intact and without nystagmus. Visual fields by finger perimetry are intact. Hearing to finger rub intact.  Facial sensation intact to fine touch. Facial motor strength is symmetric and tongue and uvula move midline. Shoulder shrug was symmetrical.    The patient was advised of the nature of the diagnosed sleep disorder , the treatment options and risks for general a health and  wellness arising from not treating the condition.  I spent more than 25 minutes of face to face time with the patient. Greater than 50% of time was spent in counseling and coordination of care. We have discussed the diagnosis and differential and I answered the patient's questions.     Assessment:  After physical and neurologic examination, review of laboratory studies,  Personal review of imaging studies, reports of other /same  Imaging studies ,  Results of polysomnography/ neurophysiology testing and pre-existing records as far as provided in visit., my assessment is    Dear Dr. Elder Chapman and Dr Nolon Rod, . Thank you so much for allowing me to participate in this pleasant gentleman's sleep care, As you know  Mr. Girvin has visible risk factors for obstructive sleep apnea including truncal obesity, a larger neck and a medium high-grade Mallampati. Given that his sleep study revealed moderate apnea which exacerbated to more severe forms in supine sleep position and during REM sleep his only treatment is CPAP. I would still encourage him to avoid supine sleep and would like him to try to lose weight which of course will reduce apnea. A carbohydrate low diet would be recommended. I will today sent in order to our [facility]ero care for a CPAP machine, air sense by [employer], and if possible an air touch fullface mask. The machine is to be set at 10 cm water pressure with 2 cm EPR. Rv with NP in 30-90 days for compliance.    Asencion Partridge Curlie Macken MD  08/05/2016   CC: Jerry Chapman, [place],  [id number]

## 2016-08-05 NOTE — Patient Instructions (Signed)

## 2016-08-05 NOTE — Telephone Encounter (Signed)
Pt is checking on the status of his gout medication   Best number [phone]

## 2016-08-12 MED ORDER — COLCHICINE 0.6 MG PO TABS: 0.6000 mg | ORAL_TABLET | Freq: Every day | ORAL | 3 refills | Status: DC

## 2016-08-12 NOTE — Telephone Encounter (Signed)
Spoke with patient. He indicated that the pharmacy gave him a 10 day supply.  He stated that the pharmacy mention to him that they would have to call the doctor to get approval for further refills because patient needs an office visit.  Patient would also wants an 6 month supply on his gout medication if possible.

## 2016-08-12 NOTE — Telephone Encounter (Signed)
Notified pt of colchicine refill. He takes it daily.

## 2016-10-10 ENCOUNTER — Ambulatory Visit (INDEPENDENT_AMBULATORY_CARE_PROVIDER_SITE_OTHER): Payer: Medicare HMO | Admitting: Emergency Medicine

## 2016-10-10 VITALS — BP 148/90 | HR 71 | Temp 98.4°F | Resp 16 | Ht 69.0 in | Wt 215.8 lb

## 2016-10-10 DIAGNOSIS — L918 Other hypertrophic disorders of the skin: Secondary | ICD-10-CM

## 2016-10-10 DIAGNOSIS — I10 Essential (primary) hypertension: Secondary | ICD-10-CM | POA: Diagnosis not present

## 2016-10-10 MED ORDER — CEPHALEXIN 500 MG PO CAPS: 500.0000 mg | ORAL_CAPSULE | Freq: Three times a day (TID) | ORAL | 0 refills | Status: DC

## 2016-10-10 NOTE — Patient Instructions (Addendum)
     IF you received an x-ray today, you will receive an invoice from [facility]. Please contact [facility] at [phone] with questions or concerns regarding your invoice.   IF you received labwork today, you will receive an invoice from [facility]. Please contact [facility] at [phone] with questions or concerns regarding your invoice.   Our billing staff will not be able to assist you with questions regarding bills from these companies.  You will be contacted with the lab results as soon as they are available. The fastest way to get your results is to activate your My Chart account. Instructions are located on the last page of this paperwork. If you have not heard from us regarding the results in 2 weeks, please contact this office.     Skin Tag, Adult A skin tag (acrochordon) is a soft, extra growth of skin. Most skin tags are flesh-colored and rarely bigger than a pencil eraser. They commonly form near areas where there are folds in the skin, such as the armpit or groin. Skin tags are not dangerous, and they do not spread from person to person (are not contagious). You may have one skin tag or several. Skin tags do not require treatment. However, your health care provider may recommend removal of a skin tag if it:  Gets irritated from clothing.  Bleeds.  Is visible and unsightly. Your health care provider can remove skin tags with a simple surgical procedure or a procedure that involves freezing the skin tag. Follow these instructions at home:  Watch for any changes in your skin tag. A normal skin tag does not require any other special care at home.  Take over-the-counter and prescription medicines only as told by your health care provider.  Keep all follow-up visits as told by your health care provider. This is important. Contact a health care provider if:  You have a skin tag that:  Becomes painful.  Changes color.  Bleeds.  Swells.  You develop  more skin tags. This information is not intended to replace advice given to you by your health care provider. Make sure you discuss any questions you have with your health care provider. Document Released: 08/27/2015 Document Revised: 04/07/2016 Document Reviewed: 08/27/2015 Elsevier Interactive Patient Education  2017 Elsevier Inc.  

## 2016-10-10 NOTE — Progress Notes (Signed)
Jerry Chapman 66 y.o.   Chief Complaint  Patient presents with  . skin tag    LEFT  EYE AREA x 2weeks    HISTORY OF PRESENT ILLNESS: This is a 66 y.o. male complaining of inflammed/infected skin tag to left periorbital area x 1-2 weeks.  HPI   Prior to Admission medications   Medication Sig Start Date End Date Taking? Authorizing Provider  allopurinol (ZYLOPRIM) 300 MG tablet Take 1 tablet (300 mg total) by mouth daily. 07/29/16  Yes Zoe A Nolon Rod, MD  amLODipine (NORVASC) 5 MG tablet Take 1 tablet (5 mg total) by mouth daily. 07/29/16  Yes Zoe A Nolon Rod, MD  colchicine (COLCRYS) 0.6 MG tablet Take 1 tablet (0.6 mg total) by mouth daily. 08/12/16  Yes Zoe A Nolon Rod, MD  ketoconazole (NIZORAL) 2 % shampoo Apply 1 application topically 2 (two) times a week. 07/29/16  Yes Forrest Moron, MD  Naproxen Sodium (ALEVE PO) Take 480 mg by mouth.   Yes Historical Provider, MD  cephALEXin (KEFLEX) 500 MG capsule Take 1 capsule (500 mg total) by mouth 3 (three) times daily. 10/10/16 10/17/16  Horald Pollen, MD    No Known Allergies  Patient Active Problem List   Diagnosis Date Noted  . OSA (obstructive sleep apnea) 08/05/2016  . Oxygen desaturation during REM sleep 08/05/2016  . Morbid obesity ([place]) 08/05/2016  . Dermatitis 06/11/2016  . Class 1 obesity due to excess calories without serious comorbidity with body mass index (BMI) of 32.0 to 32.9 in adult 06/11/2016  . Morbid obesity due to excess calories ([place]) 03/20/2016  . Snoring 03/20/2016  . Sleep apnea 03/20/2016  . Hypersomnia with sleep apnea 03/20/2016  . Hematuria 02/26/2016  . Adenocarcinoma of prostate ([place]) 07/16/2015  . Acquired phimosis 07/05/2013  . HTN (hypertension) 05/09/2013  . Gout 05/09/2013  . Renal insufficiency 05/09/2013    Past Medical History:  Diagnosis Date  . Arthritis, gouty    as of 02-07-2016  . At risk for sleep apnea    STOP-BANG= 5      SENT TO PCP 02-07-2016  . Bilateral  hydrocele   . Frequency of urination   . History of prostate cancer UROLOGIST--  DR Karsten Ro--  last PSA in 2014 (0.79)   dx 2004--  T1c, Gleason 3+3, PSA 10.5  --  completed IM radiation therapy 04/ 2006  . Hypertension   . Right rotator cuff tear    per pt will be scheduling surgery this summer    Past Surgical History:  Procedure Laterality Date  . CIRCUMCISION N/A 07/05/2013   Procedure: CIRCUMCISION ADULT, aspiration of right hydrocele, meatal dilitation;  Surgeon: Claybon Jabs, MD;  Location: [facility];  Service: Urology;  Laterality: N/A;  . CYSTOSCOPY  02/12/2016   Procedure: CYSTOSCOPY;  Surgeon: Kathie Rhodes, MD;  Location: [facility];  Service: Urology;;  . Carollee Herter EXCISION Bilateral 02/12/2016   Procedure: HYDROCELECTOMY ADULT;  Surgeon: Kathie Rhodes, MD;  Location: [facility];  Service: Urology;  Laterality: Bilateral;    Social History   Social History  . Marital status: Divorced    Spouse name: N/A  . Number of children: N/A  . Years of education: N/A   Occupational History  . Contractor    Social History Main Topics  . Smoking status: Never Smoker  . Smokeless tobacco: Never Used  . Alcohol use 1.0 oz/week    2 Standard drinks or equivalent per week  . Drug  use: No  . Sexual activity: Not on file   Other Topics Concern  . 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    Lives at home byself    Family History  Problem Relation Age of Onset  . Colon cancer Neg Hx   . Pancreatic cancer Neg Hx   . Stomach cancer Neg Hx   . Esophageal cancer Neg Hx      Review of Systems  Constitutional: Negative for chills and fever.  HENT: Negative.   Eyes: Negative.   Respiratory: Negative.   Cardiovascular: Negative.   Gastrointestinal: Negative.   Musculoskeletal: Negative.   Skin: Negative for itching.       Facial skin tags.  Neurological: Negative.     Endo/Heme/Allergies: Negative.   All other systems reviewed and are negative.  Vitals:   10/10/16 1014 10/10/16 1030  BP: (!) 160/96 (!) 148/90  Pulse: 71   Resp: 16   Temp: 98.4 F (36.9 C)      Physical Exam  Constitutional: He is oriented to person, place, and time. He appears well-developed and well-nourished.  HENT:  Head: Normocephalic and atraumatic.  Eyes: EOM are normal. Pupils are equal, round, and reactive to light.  Neck: Normal range of motion. Neck supple.  Cardiovascular: Normal rate.   Pulmonary/Chest: Effort normal.  Musculoskeletal: Normal range of motion.  Neurological: He is alert and oriented to person, place, and time.  Skin: Skin is warm and dry. Capillary refill takes less than 2 seconds.  Left lateral upper cheek area has erythematous and slightly swollen skin tag.  Psychiatric: He has a normal mood and affect. His behavior is normal.  Vitals reviewed.    ASSESSMENT & PLAN: Jerry Chapman was seen today for skin tag.  Diagnoses and all orders for this visit:  Inflamed skin tag Comments: facial/infected Orders: -     Ambulatory referral to Dermatology  Essential hypertension  Other orders -     Discontinue: cephALEXin (KEFLEX) 500 MG capsule; Take 1 capsule (500 mg total) by mouth 3 (three) times daily. -     cephALEXin (KEFLEX) 500 MG capsule; Take 1 capsule (500 mg total) by mouth 3 (three) times daily.    Patient Instructions       IF you received an x-ray today, you will receive an invoice from [facility]. Please contact [facility] at [phone] with questions or concerns regarding your invoice.   IF you received labwork today, you will receive an invoice from [facility]. Please contact [facility] at [phone] with questions or concerns regarding your invoice.   Our billing staff will not be able to assist you with questions regarding bills from these companies.  You will be contacted with the lab results as soon  as they are available. The fastest way to get your results is to activate your My Chart account. Instructions are located on the last page of this paperwork. If you have not heard from [place] regarding the results in 2 weeks, please contact this office.     Skin Tag, Adult A skin tag (acrochordon) is a soft, extra growth of skin. Most skin tags are flesh-colored and rarely bigger than a pencil eraser. They commonly form near areas where there are folds in the skin, such as the armpit or groin. Skin tags are not dangerous, and they do not spread from person to person (are not contagious). You may have one skin tag or several. Skin  tags do not require treatment. However, your health care provider may recommend removal of a skin tag if it:  Gets irritated from clothing.  Bleeds.  Is visible and unsightly. Your health care provider can remove skin tags with a simple surgical procedure or a procedure that involves freezing the skin tag. Follow these instructions at home:  Watch for any changes in your skin tag. A normal skin tag does not require any other special care at home.  Take over-the-counter and prescription medicines only as told by your health care provider.  Keep all follow-up visits as told by your health care provider. This is important. Contact a health care provider if:  You have a skin tag that:  Becomes painful.  Changes color.  Bleeds.  Swells.  You develop more skin tags. This information is not intended to replace advice given to you by your health care provider. Make sure you discuss any questions you have with your health care provider. Document Released: 08/27/2015 Document Revised: 04/07/2016 Document Reviewed: 08/27/2015 Elsevier Interactive Patient Education  2017 Elsevier Inc.      Agustina Caroli, MD [facility]

## 2016-11-04 ENCOUNTER — Ambulatory Visit: Payer: Medicare HMO | Admitting: Adult Health

## 2016-11-07 ENCOUNTER — Ambulatory Visit: Payer: Medicare HMO | Admitting: Adult Health

## 2017-01-13 ENCOUNTER — Other Ambulatory Visit: Payer: Self-pay | Admitting: Family Medicine

## 2017-01-13 DIAGNOSIS — M1000 Idiopathic gout, unspecified site: Secondary | ICD-10-CM

## 2017-01-13 NOTE — Telephone Encounter (Signed)
PATIENT WANTS DR. Nolon Rod TO KNOW THAT HE WOULD LIKE HIS ALLOPURINOL (ZYLOPRIM) 300 MG TO GO TO [address] INSTEAD OF [facility]. HE ALSO WANTS TO GET HER TO WRITE FOR GENERIC COLCHICINE 0.6 MG BECAUSE THE BRAND NAME IS TOO EXPENSIVE. THE PHARMACIST TOLD HIM THEY DO NOT HAVE A PRESCRIPTION ON FILE FOR THE GENERIC. BEST PHONE [phone] (CELL) PHARMACY CHOICE IS [place]. [place]

## 2017-01-15 MED ORDER — ALLOPURINOL 300 MG PO TABS: 300.0000 mg | ORAL_TABLET | Freq: Every day | ORAL | 3 refills | Status: DC

## 2017-01-15 MED ORDER — COLCHICINE 0.6 MG PO TABS: 0.6000 mg | ORAL_TABLET | Freq: Every day | ORAL | 3 refills | Status: DC

## 2017-01-16 ENCOUNTER — Ambulatory Visit (INDEPENDENT_AMBULATORY_CARE_PROVIDER_SITE_OTHER): Payer: Medicare HMO | Admitting: Emergency Medicine

## 2017-01-16 ENCOUNTER — Ambulatory Visit (INDEPENDENT_AMBULATORY_CARE_PROVIDER_SITE_OTHER): Payer: Medicare HMO

## 2017-01-16 ENCOUNTER — Encounter: Payer: Self-pay | Admitting: Emergency Medicine

## 2017-01-16 ENCOUNTER — Other Ambulatory Visit: Payer: Self-pay | Admitting: Emergency Medicine

## 2017-01-16 VITALS — BP 147/92 | HR 72 | Temp 98.1°F | Resp 18 | Ht 69.0 in | Wt 223.6 lb

## 2017-01-16 DIAGNOSIS — S8992XA Unspecified injury of left lower leg, initial encounter: Secondary | ICD-10-CM | POA: Insufficient documentation

## 2017-01-16 DIAGNOSIS — S92902A Unspecified fracture of left foot, initial encounter for closed fracture: Secondary | ICD-10-CM | POA: Insufficient documentation

## 2017-01-16 DIAGNOSIS — M1000 Idiopathic gout, unspecified site: Secondary | ICD-10-CM

## 2017-01-16 DIAGNOSIS — M7989 Other specified soft tissue disorders: Secondary | ICD-10-CM | POA: Diagnosis not present

## 2017-01-16 DIAGNOSIS — S99922A Unspecified injury of left foot, initial encounter: Secondary | ICD-10-CM | POA: Diagnosis not present

## 2017-01-16 DIAGNOSIS — S99912A Unspecified injury of left ankle, initial encounter: Secondary | ICD-10-CM | POA: Diagnosis not present

## 2017-01-16 MED ORDER — ALLOPURINOL 300 MG PO TABS: 300.0000 mg | ORAL_TABLET | Freq: Every day | ORAL | 5 refills | Status: DC

## 2017-01-16 MED ORDER — COLCHICINE 0.6 MG PO TABS: 0.6000 mg | ORAL_TABLET | Freq: Every day | ORAL | 3 refills | Status: DC

## 2017-01-16 MED ORDER — AMLODIPINE BESYLATE 5 MG PO TABS: 5.0000 mg | ORAL_TABLET | Freq: Every day | ORAL | 3 refills | Status: DC

## 2017-01-16 MED ORDER — DICLOFENAC SODIUM 75 MG PO TBEC: 75.0000 mg | DELAYED_RELEASE_TABLET | Freq: Two times a day (BID) | ORAL | 0 refills | Status: AC

## 2017-01-16 NOTE — Progress Notes (Signed)
Jerry Chapman 66 y.o.   Chief Complaint  Patient presents with  . Ankle Injury    left ankle pt fell x1day   . Knee Injury    left    HISTORY OF PRESENT ILLNESS: This is a 66 y.o. male complaining of left ankle and knee pain following injury yesterday. No other significant symptoms.  HPI   Prior to Admission medications   Medication Sig Start Date End Date Taking? Authorizing Provider  allopurinol (ZYLOPRIM) 300 MG tablet Take 1 tablet (300 mg total) by mouth daily. 01/15/17  Yes Stallings, Zoe A, MD  amLODipine (NORVASC) 5 MG tablet Take 1 tablet (5 mg total) by mouth daily. 07/29/16  Yes Stallings, Zoe A, MD  cephALEXin (KEFLEX) 500 MG capsule Take 1 capsule (500 mg total) by mouth 3 (three) times daily. 10/10/16  Yes Malcome Ambrocio, Ines Bloomer, MD  colchicine (COLCRYS) 0.6 MG tablet Take 1 tablet (0.6 mg total) by mouth daily. 01/15/17  Yes Stallings, Zoe A, MD  ketoconazole (NIZORAL) 2 % shampoo Apply 1 application topically 2 (two) times a week. 07/29/16  Yes Stallings, Zoe A, MD  Naproxen Sodium (ALEVE PO) Take 480 mg by mouth.   Yes [provider]    No Known Allergies  Patient Active Problem List   Diagnosis Date Noted  . OSA (obstructive sleep apnea) 08/05/2016  . Oxygen desaturation during REM sleep 08/05/2016  . Morbid obesity ([place]) 08/05/2016  . Dermatitis 06/11/2016  . Class 1 obesity due to excess calories without serious comorbidity with body mass index (BMI) of 32.0 to 32.9 in adult 06/11/2016  . Morbid obesity due to excess calories ([place]) 03/20/2016  . Snoring 03/20/2016  . Sleep apnea 03/20/2016  . Hypersomnia with sleep apnea 03/20/2016  . Hematuria 02/26/2016  . Adenocarcinoma of prostate ([place]) 07/16/2015  . Acquired phimosis 07/05/2013  . HTN (hypertension) 05/09/2013  . Gout 05/09/2013  . Renal insufficiency 05/09/2013    Past Medical History:  Diagnosis Date  . Arthritis, gouty    as of 02-07-2016  . At risk for sleep apnea     STOP-BANG= 5      SENT TO PCP 02-07-2016  . Bilateral hydrocele   . Frequency of urination   . History of prostate cancer UROLOGIST--  DR Karsten Ro--  last PSA in 2014 (0.79)   dx 2004--  T1c, Gleason 3+3, PSA 10.5  --  completed IM radiation therapy 04/ 2006  . Hypertension   . Right rotator cuff tear    per pt will be scheduling surgery this summer    Past Surgical History:  Procedure Laterality Date  . CIRCUMCISION N/A 07/05/2013   Procedure: CIRCUMCISION ADULT, aspiration of right hydrocele, meatal dilitation;  Surgeon: Claybon Jabs, MD;  Location: [place];  Service: Urology;  Laterality: N/A;  . CYSTOSCOPY  02/12/2016   Procedure: CYSTOSCOPY;  Surgeon: Kathie Rhodes, MD;  Location: [facility];  Service: Urology;;  . Carollee Herter EXCISION Bilateral 02/12/2016   Procedure: HYDROCELECTOMY ADULT;  Surgeon: Kathie Rhodes, MD;  Location: [facility];  Service: Urology;  Laterality: Bilateral;    Social History   Social History  . Marital status: Divorced    Spouse name: N/A  . Number of children: N/A  . Years of education: N/A   Occupational History  . Contractor    Social History Main Topics  . Smoking status: Never Smoker  . Smokeless tobacco: Never Used  . Alcohol use 1.0 oz/week  2 Standard drinks or equivalent per week  . Drug use: No  . Sexual activity: Not on file   Other Topics Concern  . 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    Lives at home byself    Family History  Problem Relation Age of Onset  . Colon cancer Neg Hx   . Pancreatic cancer Neg Hx   . Stomach cancer Neg Hx   . Esophageal cancer Neg Hx      Review of Systems  Musculoskeletal: Positive for joint pain.  Skin: Positive for rash (abrasion).  All other systems reviewed and are negative.   Vitals:   01/16/17 1400  BP: (!) 147/92  Pulse: 72  Resp: 18  Temp: 98.1 F (36.7 C)    Physical  Exam  Constitutional: He is oriented to person, place, and time. He appears well-developed and well-nourished.  HENT:  Head: Normocephalic and atraumatic.  Eyes: EOM are normal. Pupils are equal, round, and reactive to light.  Neck: Normal range of motion. Neck supple.  Cardiovascular: Normal rate and regular rhythm.   Pulmonary/Chest: Effort normal.  Musculoskeletal:  LLE: NVI; +swelling with hematoma and tenderness proximal outer dorsum left foot. FROM left ankle Left knee: mild swelling, non-tender, +abrasion, FROM, stable in flexion and extension. Rest of extremities: WNL  Neurological: He is alert and oriented to person, place, and time.  Skin: Skin is warm and dry. Capillary refill takes less than 2 seconds.  Psychiatric: He has a normal mood and affect. His behavior is normal.  Vitals reviewed.  Dg Knee 1-2 Views Left  Result Date: 01/16/2017 CLINICAL DATA:  66 year old male status post fall yesterday with left lower extremity injury. EXAM: LEFT KNEE - 1-2 VIEW COMPARISON:  None. FINDINGS: Anterior soft tissue swelling. Moderate to large suprapatellar joint effusion. Severe patellofemoral joint space loss and osteophytosis. The patella appears intact. Severe medial and lateral compartment degenerative spurring but better preserved joint spaces. No acute osseous abnormality identified. IMPRESSION: 1. Anterior soft tissue swelling. Moderate to large left knee joint effusion with advanced tricompartmental knee degeneration, worst in the patellofemoral compartment. 2.  No acute osseous abnormality identified. Electronically Signed   By: Genevie Ann M.D.   On: 01/16/2017 14:39   Dg Ankle Complete Left  Result Date: 01/16/2017 CLINICAL DATA:  66 year old male status post fall yesterday with left lower extremity injury. EXAM: LEFT ANKLE COMPLETE - 3+ VIEW COMPARISON:  None. FINDINGS: Severe osteophytosis about the left ankle appears chronic. This includes subtalar and calcaneus degenerative  spurring. The lateral view is somewhat oblique but mortise joint alignment appears preserved. No acute fracture of the talar dome identified. No acute fracture of the distal tibia or fibula identified. There is a conspicuous bone fragment along the lateral aspect of the anterior calcaneus (image 1). Otherwise the calcaneus appears intact with degenerative spurring. IMPRESSION: 1. Appearance suspicious for avulsion injury of the left extensor digitorum brevis with small avulsion fragment at the anterolateral calcaneus. 2. Severe degenerative changes at the left ankle. No other acute fracture or dislocation identified. Electronically Signed   By: Genevie Ann M.D.   On: 01/16/2017 14:35   Dg Foot Complete Left  Result Date: 01/16/2017 CLINICAL DATA:  66 year old male status post fall yesterday with left lower extremity injury. EXAM: LEFT FOOT - COMPLETE 3+ VIEW COMPARISON:  Left ankle series today reported separately. FINDINGS: Re- demonstration of possible small acute of avulsion fragment along the anterolateral  calcaneus (arrow). See comparison. Other tarsal bones appear intact. Chronic appearing degenerative spurring along the neck of the talus, the subtalar joint, and the calcaneus. Metatarsals appear intact and normally aligned. No acute fracture or dislocation of the phalanges is identified. There is moderate first MTP joint degeneration including joint space loss and osteophytosis. IMPRESSION: 1. Possible avulsion injury along the anterolateral calcaneus. See left ankle series today. 2. No other acute fracture or dislocation identified about the left foot. 3. First MTP joint osteoarthritis. Electronically Signed   By: Genevie Ann M.D.   On: 01/16/2017 14:37     ASSESSMENT & PLAN: Zackarey was seen today for ankle injury and knee injury.  Diagnoses and all orders for this visit:  Injury of left lower extremity, initial encounter -     DG Ankle Complete Left; Future -     DG Foot Complete Left; Future -      DG Knee 1-2 Views Left; Future -     Apply ace wrap -     Post-op shoe  Acute idiopathic gout, unspecified site -     allopurinol (ZYLOPRIM) 300 MG tablet; Take 1 tablet (300 mg total) by mouth daily.  Closed fracture of left foot, initial encounter -     Ambulatory referral to Orthopedic Surgery  Other orders -     amLODipine (NORVASC) 5 MG tablet; Take 1 tablet (5 mg total) by mouth daily. -     colchicine (COLCRYS) 0.6 MG tablet; Take 1 tablet (0.6 mg total) by mouth daily.   Patient Instructions       IF you received an x-ray today, you will receive an invoice from [facility]. Please contact [facility] at [phone] with questions or concerns regarding your invoice.   IF you received labwork today, you will receive an invoice from [facility]. Please contact [facility] at [phone] with questions or concerns regarding your invoice.   Our billing staff will not be able to assist you with questions regarding bills from these companies.  You will be contacted with the lab results as soon as they are available. The fastest way to get your results is to activate your My Chart account. Instructions are located on the last page of this paperwork. If you have not heard from [place] regarding the results in 2 weeks, please contact this office.     Avulsion Fracture of the Foot An avulsion fracture of the foot is when a piece of bone in your foot has been torn away. Bones are connected to other bones by strong bands of connective tissue (ligaments). Muscles are also connected to bones with connective tissue (tendons). Avulsion fractures occur when severe stress on a bone from a ligament or tendon causes a small piece of bone to be pulled away. Athletes may develop an avulsion fracture of the foot gradually (chronic avulsion fracture). The heel bone and the long bone in the foot that connect to the fifth toe (fifth metatarsal bone) are common areas of avulsion fracture of the  foot. What are the causes? An avulsion fracture of the foot can be caused by a sudden or repetitive twisting of your foot or ankle. It can also occur during a fall from a standing height. What increases the risk? You may have a higher risk of an avulsion fracture of the foot if you:  Participate in activities during which twisting the ankle or foot are likely, such as:  Dancing.  Track and field.  Walking or hiking on uneven surfaces.  Have had diabetes for many years.  Have osteoporosis. What are the signs or symptoms? The most common symptom of an avulsion fracture of the foot is intense pain at the time of injury. You may also feel a pop or tearing. The pain continues after the injury. Other signs and symptoms may include:  Swelling.  Bruising.  Pain with movement or weight bearing.  Difficulty walking.  Pain when pressure is applied to the injured area.  Warmth over the injured area. How is this diagnosed? An avulsion fracture of the foot may be diagnosed by:  History. Your health care provider will ask you what occurred during the time of your injury and whether you had any pain in the area before your injury.  Physical exam. During the exam, your health care provider may try to move your foot, toes, and ankle to check for pain and level of mobility.  X-ray. This will show if any bones are fractured or out of place.  MRI. This will show your tendons and ligaments. Some avulsion fractures are associated with an injury to a tendon or ligament. How is this treated? Treatment for an avulsion fracture of the foot depends on the size of the displaced piece of bone and how far it has been pulled out of place. Small avulsion fractures may be treated with rest and support in a cast or brace. Large fragments of bone usually need to be reattached surgically. Treatment of these fractures may also require physical therapy to regain full use of your foot. Treatments may  include:  Rest, ice, compression, and elevations (RICE treatment) as directed by your health care provider.  Medicines that reduce pain and swelling (NSAIDs).  Wearing a splint, elastic wrap, support boot, or cast as directed by your health care provider.  Crutches or a rolling scooter to support your body weight until your foot heals.  Surgery to reattach the bone and tendon or ligament.  Physical therapy. This may last for several months. Follow these instructions at home:  Take medicines only as directed by your health care provider.  Rest your foot until your health care provider says you can resume activity.  Keep your foot raised above the level of your heart when you are sitting or lying down.  Apply ice to the injured area:  Put ice in a plastic bag.  Place a towel between your skin and the bag.  Leave the ice on for 20 minutes, 2-3 times a day.  Do not allow your cast or splint to get wet as directed by your health care provider.  Keep all follow-up visits as directed by your health care provider. This is important. Contact a health care provider if:  Your pain gets worse.  You have chills or fever.  Your cast or splint is damaged.  The cast has a bad odor or has stains caused by fluids from the wound. Get help right away if:  Your foot is cold, blue, or pale.  You have pain, swelling, redness, or numbness below your cast or splint. This information is not intended to replace advice given to you by your health care provider. Make sure you discuss any questions you have with your health care provider. Document Released: 03/09/2014 Document Revised: 01/18/2016 Document Reviewed: 11/05/2013 Elsevier Interactive Patient Education  2017 Elsevier Inc.      Agustina Caroli, MD [facility]

## 2017-01-16 NOTE — Patient Instructions (Addendum)
IF you received an x-ray today, you will receive an invoice from [facility]. Please contact [facility] at [phone] with questions or concerns regarding your invoice.   IF you received labwork today, you will receive an invoice from [place]. Please contact [facility] at [phone] with questions or concerns regarding your invoice.   Our billing staff will not be able to assist you with questions regarding bills from these companies.  You will be contacted with the lab results as soon as they are available. The fastest way to get your results is to activate your My Chart account. Instructions are located on the last page of this paperwork. If you have not heard from [place] regarding the results in 2 weeks, please contact this office.     Avulsion Fracture of the Foot An avulsion fracture of the foot is when a piece of bone in your foot has been torn away. Bones are connected to other bones by strong bands of connective tissue (ligaments). Muscles are also connected to bones with connective tissue (tendons). Avulsion fractures occur when severe stress on a bone from a ligament or tendon causes a small piece of bone to be pulled away. Athletes may develop an avulsion fracture of the foot gradually (chronic avulsion fracture). The heel bone and the long bone in the foot that connect to the fifth toe (fifth metatarsal bone) are common areas of avulsion fracture of the foot. What are the causes? An avulsion fracture of the foot can be caused by a sudden or repetitive twisting of your foot or ankle. It can also occur during a fall from a standing height. What increases the risk? You may have a higher risk of an avulsion fracture of the foot if you:  Participate in activities during which twisting the ankle or foot are likely, such as:  Dancing.  Track and field.  Walking or hiking on uneven surfaces.  Have had diabetes for many years.  Have osteoporosis. What are the  signs or symptoms? The most common symptom of an avulsion fracture of the foot is intense pain at the time of injury. You may also feel a pop or tearing. The pain continues after the injury. Other signs and symptoms may include:  Swelling.  Bruising.  Pain with movement or weight bearing.  Difficulty walking.  Pain when pressure is applied to the injured area.  Warmth over the injured area. How is this diagnosed? An avulsion fracture of the foot may be diagnosed by:  History. Your health care provider will ask you what occurred during the time of your injury and whether you had any pain in the area before your injury.  Physical exam. During the exam, your health care provider may try to move your foot, toes, and ankle to check for pain and level of mobility.  X-ray. This will show if any bones are fractured or out of place.  MRI. This will show your tendons and ligaments. Some avulsion fractures are associated with an injury to a tendon or ligament. How is this treated? Treatment for an avulsion fracture of the foot depends on the size of the displaced piece of bone and how far it has been pulled out of place. Small avulsion fractures may be treated with rest and support in a cast or brace. Large fragments of bone usually need to be reattached surgically. Treatment of these fractures may also require physical therapy to regain full use of your foot. Treatments may include:  Rest, ice,  compression, and elevations (RICE treatment) as directed by your health care provider.  Medicines that reduce pain and swelling (NSAIDs).  Wearing a splint, elastic wrap, support boot, or cast as directed by your health care provider.  Crutches or a rolling scooter to support your body weight until your foot heals.  Surgery to reattach the bone and tendon or ligament.  Physical therapy. This may last for several months. Follow these instructions at home:  Take medicines only as directed by your  health care provider.  Rest your foot until your health care provider says you can resume activity.  Keep your foot raised above the level of your heart when you are sitting or lying down.  Apply ice to the injured area:  Put ice in a plastic bag.  Place a towel between your skin and the bag.  Leave the ice on for 20 minutes, 2-3 times a day.  Do not allow your cast or splint to get wet as directed by your health care provider.  Keep all follow-up visits as directed by your health care provider. This is important. Contact a health care provider if:  Your pain gets worse.  You have chills or fever.  Your cast or splint is damaged.  The cast has a bad odor or has stains caused by fluids from the wound. Get help right away if:  Your foot is cold, blue, or pale.  You have pain, swelling, redness, or numbness below your cast or splint. This information is not intended to replace advice given to you by your health care provider. Make sure you discuss any questions you have with your health care provider. Document Released: 03/09/2014 Document Revised: 01/18/2016 Document Reviewed: 11/05/2013 Elsevier Interactive Patient Education  2017 [employer].

## 2017-01-31 ENCOUNTER — Encounter (INDEPENDENT_AMBULATORY_CARE_PROVIDER_SITE_OTHER): Payer: Self-pay | Admitting: Orthopaedic Surgery

## 2017-01-31 ENCOUNTER — Ambulatory Visit (INDEPENDENT_AMBULATORY_CARE_PROVIDER_SITE_OTHER): Payer: Medicare HMO | Admitting: Orthopaedic Surgery

## 2017-01-31 DIAGNOSIS — M1712 Unilateral primary osteoarthritis, left knee: Secondary | ICD-10-CM | POA: Diagnosis not present

## 2017-01-31 MED ORDER — METHYLPREDNISOLONE ACETATE 40 MG/ML IJ SUSP
40.0000 mg | INTRAMUSCULAR | Status: AC | PRN
  Administered 2017-01-31: 40 mg via INTRA_ARTICULAR

## 2017-01-31 MED ORDER — BUPIVACAINE HCL 0.5 % IJ SOLN
2.0000 mL | INTRAMUSCULAR | Status: AC | PRN
  Administered 2017-01-31: 2 mL via INTRA_ARTICULAR

## 2017-01-31 MED ORDER — LIDOCAINE HCL 1 % IJ SOLN
2.0000 mL | INTRAMUSCULAR | Status: AC | PRN
  Administered 2017-01-31: 2 mL

## 2017-01-31 NOTE — Addendum Note (Signed)
Addended by: Precious Bard on: 01/31/2017 11:22 AM   Modules accepted: Orders

## 2017-01-31 NOTE — Progress Notes (Signed)
Office Visit Note   Patient: Jerry Chapman           Date of Birth: 07/18/1951           MRN: [id number] Visit Date: 01/31/2017              Requested by: Forrest Moron, MD [place], [place] [address] PCP: Forrest Moron, MD   Assessment & Plan: Visit Diagnoses:  1. Unilateral primary osteoarthritis, left knee     Plan: Left knee was aspirated and injected today. Patient tolerated this well. We will send the fluid off to the lab. Follow-up with me as needed.  Follow-Up Instructions: Return if symptoms worsen or fail to improve.   Orders:  No orders of the defined types were placed in this encounter.  No orders of the defined types were placed in this encounter.     Procedures: Large Joint Inj Date/Time: 01/31/2017 10:00 AM Performed by: Leandrew Koyanagi Authorized by: Leandrew Koyanagi   Consent Given by:  Patient Timeout: prior to procedure the correct patient, procedure, and site was verified   Indications:  Pain Location:  Knee Site:  R knee Prep: patient was prepped and draped in usual sterile fashion   Needle Size:  22 G Ultrasound Guidance: No   Fluoroscopic Guidance: No   Arthrogram: No   Medications:  2 mL lidocaine 1 %; 2 mL bupivacaine 0.5 %; 40 mg methylPREDNISolone acetate 40 MG/ML Aspirate amount (mL):  45 Aspirate:  Blood-tinged Patient tolerance:  Patient tolerated the procedure well with no immediate complications     Clinical Data: No additional findings.   Subjective: Chief Complaint  Patient presents with  . Left Knee - Pain    Patient comes in today with left knee pain and swelling status post fall. He knows he has arthritis of his left knee. Cortisone injections in the past with relief. He is requesting another injection today. He endorses the pain is moderate throbbing pain    Review of Systems  Constitutional: Negative.   All other systems reviewed and are negative.    Objective: Vital Signs: There were no  vitals taken for this visit.  Physical Exam  Constitutional: He is oriented to person, place, and time. He appears well-developed and well-nourished.  HENT:  Head: Normocephalic and atraumatic.  Eyes: Pupils are equal, round, and reactive to light.  Neck: Neck supple.  Pulmonary/Chest: Effort normal.  Abdominal: Soft.  Musculoskeletal: Normal range of motion.  Neurological: He is alert and oriented to person, place, and time.  Skin: Skin is warm.  Psychiatric: He has a normal mood and affect. His behavior is normal. Judgment and thought content normal.  Nursing note and vitals reviewed.   Ortho Exam Left knee exam shows a moderately sized joint effusion. There is no skin changes. Full range of motion. Collaterals and cruciates are stable. Specialty Comments:  No specialty comments available.  Imaging: No results found.   PMFS History: Patient Active Problem List   Diagnosis Date Noted  . Injury of left leg 01/16/2017  . Closed fracture of bone of left foot 01/16/2017  . OSA (obstructive sleep apnea) 08/05/2016  . Oxygen desaturation during REM sleep 08/05/2016  . Morbid obesity ([place]) 08/05/2016  . Dermatitis 06/11/2016  . Class 1 obesity due to excess calories without serious comorbidity with body mass index (BMI) of 32.0 to 32.9 in adult 06/11/2016  . Morbid obesity due to excess calories ([place]) 03/20/2016  . Snoring  03/20/2016  . Sleep apnea 03/20/2016  . Hypersomnia with sleep apnea 03/20/2016  . Hematuria 02/26/2016  . Adenocarcinoma of prostate ([place]) 07/16/2015  . Acquired phimosis 07/05/2013  . HTN (hypertension) 05/09/2013  . Gout 05/09/2013  . Renal insufficiency 05/09/2013   Past Medical History:  Diagnosis Date  . Arthritis, gouty    as of 02-07-2016  . At risk for sleep apnea    STOP-BANG= 5      SENT TO PCP 02-07-2016  . Bilateral hydrocele   . Frequency of urination   . History of prostate cancer UROLOGIST--  DR Karsten Ro--  last PSA in 2014 (0.79)    dx 2004--  T1c, Gleason 3+3, PSA 10.5  --  completed IM radiation therapy 04/ 2006  . Hypertension   . Right rotator cuff tear    per pt will be scheduling surgery this summer    Family History  Problem Relation Age of Onset  . Colon cancer Neg Hx   . Pancreatic cancer Neg Hx   . Stomach cancer Neg Hx   . Esophageal cancer Neg Hx     Past Surgical History:  Procedure Laterality Date  . CIRCUMCISION N/A 07/05/2013   Procedure: CIRCUMCISION ADULT, aspiration of right hydrocele, meatal dilitation;  Surgeon: Claybon Jabs, MD;  Location: [place];  Service: Urology;  Laterality: N/A;  . CYSTOSCOPY  02/12/2016   Procedure: CYSTOSCOPY;  Surgeon: Kathie Rhodes, MD;  Location: [facility];  Service: Urology;;  . Carollee Herter EXCISION Bilateral 02/12/2016   Procedure: HYDROCELECTOMY ADULT;  Surgeon: Kathie Rhodes, MD;  Location: [facility];  Service: Urology;  Laterality: Bilateral;   Social History   Occupational History  . Contractor    Social History Main Topics  . Smoking status: Never Smoker  . Smokeless tobacco: Never Used  . Alcohol use 1.0 oz/week    2 Standard drinks or equivalent per week  . Drug use: No  . Sexual activity: Not on file

## 2017-02-01 LAB — SYNOVIAL CELL COUNT + DIFF, W/ CRYSTALS
Basophils, %: 0 %
Eosinophils-Synovial: 0 % (ref 0–2)
LYMPHOCYTES-SYNOVIAL FLD: 35 % (ref 0–74)
Monocyte/Macrophage: 28 % (ref 0–69)
Neutrophil, Synovial: 20 % (ref 0–24)
Synoviocytes, %: 17 % — ABNORMAL HIGH (ref 0–15)
WBC, Synovial: 311 cells/uL — ABNORMAL HIGH (ref <150)

## 2017-02-02 NOTE — Progress Notes (Signed)
Please let him know he had a gout attack.

## 2017-07-21 ENCOUNTER — Other Ambulatory Visit: Payer: Self-pay | Admitting: Family Medicine

## 2017-09-28 ENCOUNTER — Other Ambulatory Visit: Payer: Self-pay | Admitting: Family Medicine

## 2018-01-07 ENCOUNTER — Other Ambulatory Visit: Payer: Self-pay | Admitting: Emergency Medicine

## 2018-01-07 DIAGNOSIS — M1000 Idiopathic gout, unspecified site: Secondary | ICD-10-CM

## 2018-01-07 NOTE — Telephone Encounter (Signed)
Refill  Request allopurinol  LOV 01/16/2017 Dr Mitchel Honour    Reason for routing no Uric Acid  Level found within [place] on File

## 2018-01-26 ENCOUNTER — Other Ambulatory Visit: Payer: Self-pay | Admitting: Emergency Medicine

## 2018-01-26 DIAGNOSIS — M1000 Idiopathic gout, unspecified site: Secondary | ICD-10-CM

## 2018-02-05 ENCOUNTER — Other Ambulatory Visit: Payer: Self-pay | Admitting: Family Medicine

## 2018-02-13 ENCOUNTER — Other Ambulatory Visit: Payer: Self-pay | Admitting: Family Medicine

## 2018-02-13 DIAGNOSIS — M1000 Idiopathic gout, unspecified site: Secondary | ICD-10-CM

## 2018-04-06 ENCOUNTER — Other Ambulatory Visit: Payer: Self-pay | Admitting: Emergency Medicine

## 2018-04-06 NOTE — Telephone Encounter (Signed)
Amlodipine   refill Last Refill:01/16/17 # 90 with 3 refills Last OV: 01/16/17 PCP: Leane Platt MD Pharmacy:[facility] [address] [employer]  Prescription expired on 01/16/18

## 2018-04-08 ENCOUNTER — Telehealth: Payer: Self-pay | Admitting: Family Medicine

## 2018-04-08 NOTE — Telephone Encounter (Signed)
Copied from [place]. Topic: Quick Communication - Rx Refill/Question >> Apr 08, 2018  4:59 PM Cecelia Byars, NT wrote: Medication: colchicine (COLCRYS) 0.6 MG tablet  Has the patient contacted their pharmacy?{yes  (Agent: If no, request that the patient contact the pharmacy for the refill. (Agent: If yes, when and what did the pharmacy advise   Preferred Pharmacy (with phone number or street name [facility] [place], [place] - [place] [phone] (Phone) [phone] (Fax)    Agent: Please be advised that RX refills may take up to 3 business days. We ask that you follow-up with your pharmacy.

## 2018-04-09 ENCOUNTER — Ambulatory Visit: Payer: Medicare HMO | Admitting: Physician Assistant

## 2018-04-09 VITALS — BP 144/86 | HR 68 | Temp 98.0°F | Resp 16 | Ht 69.0 in | Wt 231.0 lb

## 2018-04-09 DIAGNOSIS — M1A9XX0 Chronic gout, unspecified, without tophus (tophi): Secondary | ICD-10-CM | POA: Diagnosis not present

## 2018-04-09 DIAGNOSIS — I10 Essential (primary) hypertension: Secondary | ICD-10-CM

## 2018-04-09 DIAGNOSIS — M2560 Stiffness of unspecified joint, not elsewhere classified: Secondary | ICD-10-CM | POA: Diagnosis not present

## 2018-04-09 DIAGNOSIS — Z13220 Encounter for screening for lipoid disorders: Secondary | ICD-10-CM

## 2018-04-09 DIAGNOSIS — Z131 Encounter for screening for diabetes mellitus: Secondary | ICD-10-CM

## 2018-04-09 MED ORDER — ALLOPURINOL 300 MG PO TABS: 300.0000 mg | ORAL_TABLET | Freq: Every day | ORAL | 1 refills | Status: DC

## 2018-04-09 MED ORDER — NAPROXEN 500 MG PO TABS: 500.0000 mg | ORAL_TABLET | Freq: Two times a day (BID) | ORAL | 1 refills | Status: DC

## 2018-04-09 MED ORDER — AMLODIPINE BESYLATE 10 MG PO TABS: 10.0000 mg | ORAL_TABLET | Freq: Every day | ORAL | 1 refills | Status: DC

## 2018-04-09 MED ORDER — COLCHICINE 0.6 MG PO TABS: 0.6000 mg | ORAL_TABLET | Freq: Every day | ORAL | 1 refills | Status: DC

## 2018-04-09 NOTE — Patient Instructions (Addendum)
  Follow up with Dr. Nolon Rod in 6 months.   I will contact you with your lab results within the next 2 weeks.  If you have not heard from [place] then please contact us. The fastest way to get your results is to register for My Chart.   IF you received an x-ray today, you will receive an invoice from [facility]. Please contact [facility] at [phone] with questions or concerns regarding your invoice.   IF you received labwork today, you will receive an invoice from [place]. Please contact [facility] at [phone] with questions or concerns regarding your invoice.   Our billing staff will not be able to assist you with questions regarding bills from these companies.  You will be contacted with the lab results as soon as they are available. The fastest way to get your results is to activate your My Chart account. Instructions are located on the last page of this paperwork. If you have not heard from [place] regarding the results in 2 weeks, please contact this office.

## 2018-04-09 NOTE — Telephone Encounter (Signed)
Transferred patient to schedule an appointment

## 2018-04-09 NOTE — Progress Notes (Signed)
04/09/2018 9:11 AM   DOB: [birth year] / MRN: [id number]  SUBJECTIVE:  Jerry Chapman is a 67 y.o. male presenting for gout and BP refills. Symptoms present for years and well controlled with colchicine daily and allopurinol and has had no problems with this. This plan was developed years ago for patient and he tells me this is the only thing that works well for him.  The problem is controlled.   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  He is requesting 500 mg naproxen tabs to take daily.  He denies history of heart disease and stomach problems.  Most recent creatinine was normal in 2017.  I will update that today.  He takes 5 mg of Norvasc daily denies any complications with this.  He denies chest pain, shortness of breath, DOE, leg swelling, orthopnea.  He has not been screened for diabetes and his lipid panel is unknown.     He has No Known Allergies.   He  has a past medical history of Arthritis, gouty, At risk for sleep apnea, Bilateral hydrocele, Frequency of urination, History of prostate cancer (UROLOGIST--  DR Karsten Ro--  last PSA in 2014 (0.79)), Hypertension, and Right rotator cuff tear.    He  reports that he has never smoked. He has never used smokeless tobacco. He reports that he drinks about 2.0 standard drinks of alcohol per week. He reports that he does not use drugs. He  has no sexual activity history on file. The patient  has a past surgical history that includes Circumcision (N/A, 07/05/2013); Hydrocele surgery (Bilateral, 02/12/2016); and Cystoscopy (02/12/2016).  His family history is not on file.  Review of Systems  Constitutional: Negative for chills, diaphoresis and fever.  Eyes: Negative.   Respiratory: Negative for cough, hemoptysis, sputum production, shortness of breath and wheezing.   Cardiovascular: Negative for chest pain, orthopnea and leg swelling.  Gastrointestinal: Negative for abdominal  pain, blood in stool, constipation, diarrhea, heartburn, melena, nausea and vomiting.  Genitourinary: Negative for dysuria, flank pain, frequency, hematuria and urgency.  Skin: Negative for rash.  Neurological: Negative for dizziness, sensory change, speech change, focal weakness and headaches.    The problem list and medications were reviewed and updated by myself where necessary and exist elsewhere in the encounter.   OBJECTIVE:  BP (!) 144/86   Pulse 68   Temp 98 F (36.7 C) (Oral)   Resp 16   Ht 5\' 9"  (1.753 m)   Wt 231 lb (104.8 kg)   SpO2 98%   BMI 34.11 kg/m   Wt Readings from Last 3 Encounters:  04/09/18 231 lb (104.8 kg)  01/16/17 223 lb 9.6 oz (101.4 kg)  10/10/16 215 lb 12.8 oz (97.9 kg)   Temp Readings from Last 3 Encounters:  04/09/18 98 F (36.7 C) (Oral)  01/16/17 98.1 F (36.7 C) (Oral)  10/10/16 98.4 F (36.9 C) (Oral)   BP Readings from Last 3 Encounters:  04/09/18 (!) 144/86  01/16/17 (!) 147/92  10/10/16 (!) 148/90   Pulse Readings from Last 3 Encounters:  04/09/18 68  01/16/17 72  10/10/16 71    Physical Exam  Constitutional: He is oriented to person, place, and time. He appears well-developed. He is active.  Non-toxic appearance. He does not appear ill.  Eyes: Pupils are equal, round, and reactive to light. Conjunctivae and EOM are normal.  Cardiovascular: Normal rate, regular rhythm, S1 normal, S2 normal, normal heart  sounds, intact distal pulses and normal pulses. Exam reveals no gallop and no friction rub.  No murmur heard. Pulmonary/Chest: Effort normal. No stridor. No respiratory distress. He has no wheezes. He has no rales.  Abdominal: He exhibits no distension.  Musculoskeletal: Normal range of motion. He exhibits no edema.  Neurological: He is alert and oriented to person, place, and time. No cranial nerve deficit. Coordination normal.  Skin: Skin is warm and dry. He is not diaphoretic. No pallor.  Psychiatric: He has a normal mood  and affect.  Nursing note and vitals reviewed.   No results found for: HGBA1C  Lab Results  Component Value Date   WBC 12.4 (H) 07/16/2015   HGB 16.3 02/12/2016   HCT 48.0 02/12/2016   MCV 74.1 (L) 07/16/2015   PLT 283 07/16/2015    Lab Results  Component Value Date   CREATININE 1.10 02/12/2016   BUN 17 02/12/2016   NA 144 02/12/2016   K 4.0 02/12/2016   CL 105 02/12/2016   CO2 30 07/16/2015    Lab Results  Component Value Date   ALT 30 07/16/2015   AST 25 07/16/2015   ALKPHOS 62 07/16/2015   BILITOT 0.5 07/16/2015    Lab Results  Component Value Date   TSH 1.247 07/16/2015      ASSESSMENT AND PLAN:  Jerry Chapman was seen today for medication refill.  Diagnoses and all orders for this visit:  Essential hypertension -     amLODipine (NORVASC) 10 MG tablet; Take 1 tablet (10 mg total) by mouth daily. -     Renal Function Panel -     CBC  Screening for diabetes mellitus -     Hemoglobin A1c  Screening for lipid disorders -     Lipid Panel  Chronic gout without tophus, unspecified cause, unspecified site -     allopurinol (ZYLOPRIM) 300 MG tablet; Take 1 tablet (300 mg total) by mouth daily. -     colchicine (COLCRYS) 0.6 MG tablet; Take 1 tablet (0.6 mg total) by mouth daily.  Joint stiffness -     naproxen (NAPROSYN) 500 MG tablet; Take 1 tablet (500 mg total) by mouth 2 (two) times daily with a meal.    The patient is advised to call or return to clinic if he does not see an improvement in symptoms, or to seek the care of the closest emergency department if he worsens with the above plan.   Philis Fendt, MHS, PA-C [facility] 04/09/2018 9:11 AM

## 2018-04-10 LAB — RENAL FUNCTION PANEL
ALBUMIN: 4.4 g/dL (ref 3.6–4.8)
BUN/Creatinine Ratio: 11 (ref 10–24)
BUN: 13 mg/dL (ref 8–27)
CALCIUM: 9.2 mg/dL (ref 8.6–10.2)
CHLORIDE: 102 mmol/L (ref 96–106)
CO2: 20 mmol/L (ref 20–29)
Creatinine, Ser: 1.17 mg/dL (ref 0.76–1.27)
GFR calc Af Amer: 74 mL/min/{1.73_m2} (ref >59)
GFR calc non Af Amer: 64 mL/min/{1.73_m2} (ref >59)
GLUCOSE: 107 mg/dL — AB (ref 65–99)
PHOSPHORUS: 3.2 mg/dL (ref 2.5–4.5)
POTASSIUM: 4.3 mmol/L (ref 3.5–5.2)
Sodium: 138 mmol/L (ref 134–144)

## 2018-04-10 LAB — LIPID PANEL
Chol/HDL Ratio: 5.1 ratio — ABNORMAL HIGH (ref 0.0–5.0)
Cholesterol, Total: 158 mg/dL (ref 100–199)
HDL: 31 mg/dL — AB (ref >39)
LDL Calculated: 91 mg/dL (ref 0–99)
TRIGLYCERIDES: 179 mg/dL — AB (ref 0–149)
VLDL Cholesterol Cal: 36 mg/dL (ref 5–40)

## 2018-04-10 LAB — CBC
HEMOGLOBIN: 16 g/dL (ref 13.0–17.7)
Hematocrit: 48.3 % (ref 37.5–51.0)
MCH: 24.2 pg — ABNORMAL LOW (ref 26.6–33.0)
MCHC: 33.1 g/dL (ref 31.5–35.7)
MCV: 73 fL — ABNORMAL LOW (ref 79–97)
Platelets: 254 10*3/uL (ref 150–450)
RBC: 6.6 x10E6/uL — AB (ref 4.14–5.80)
RDW: 17 % — ABNORMAL HIGH (ref 12.3–15.4)
WBC: 8.5 10*3/uL (ref 3.4–10.8)

## 2018-04-10 LAB — HEMOGLOBIN A1C
ESTIMATED AVERAGE GLUCOSE: 137 mg/dL
HEMOGLOBIN A1C: 6.4 % — AB (ref 4.8–5.6)

## 2018-07-14 ENCOUNTER — Other Ambulatory Visit: Payer: Self-pay | Admitting: Physician Assistant

## 2018-07-14 DIAGNOSIS — M1A9XX0 Chronic gout, unspecified, without tophus (tophi): Secondary | ICD-10-CM

## 2018-07-14 DIAGNOSIS — I10 Essential (primary) hypertension: Secondary | ICD-10-CM

## 2018-08-11 ENCOUNTER — Other Ambulatory Visit: Payer: Self-pay | Admitting: Physician Assistant

## 2018-08-11 DIAGNOSIS — M2560 Stiffness of unspecified joint, not elsewhere classified: Secondary | ICD-10-CM

## 2018-09-24 IMAGING — DX DG ANKLE COMPLETE 3+V*L*
3 series · 3 of 3 positions shown · non-contrast
Comparison: None.

CLINICAL DATA: 65-year-old male status post fall yesterday with
left lower extremity injury.

EXAM:
LEFT ANKLE COMPLETE - 3+ VIEW

[ankle ap]
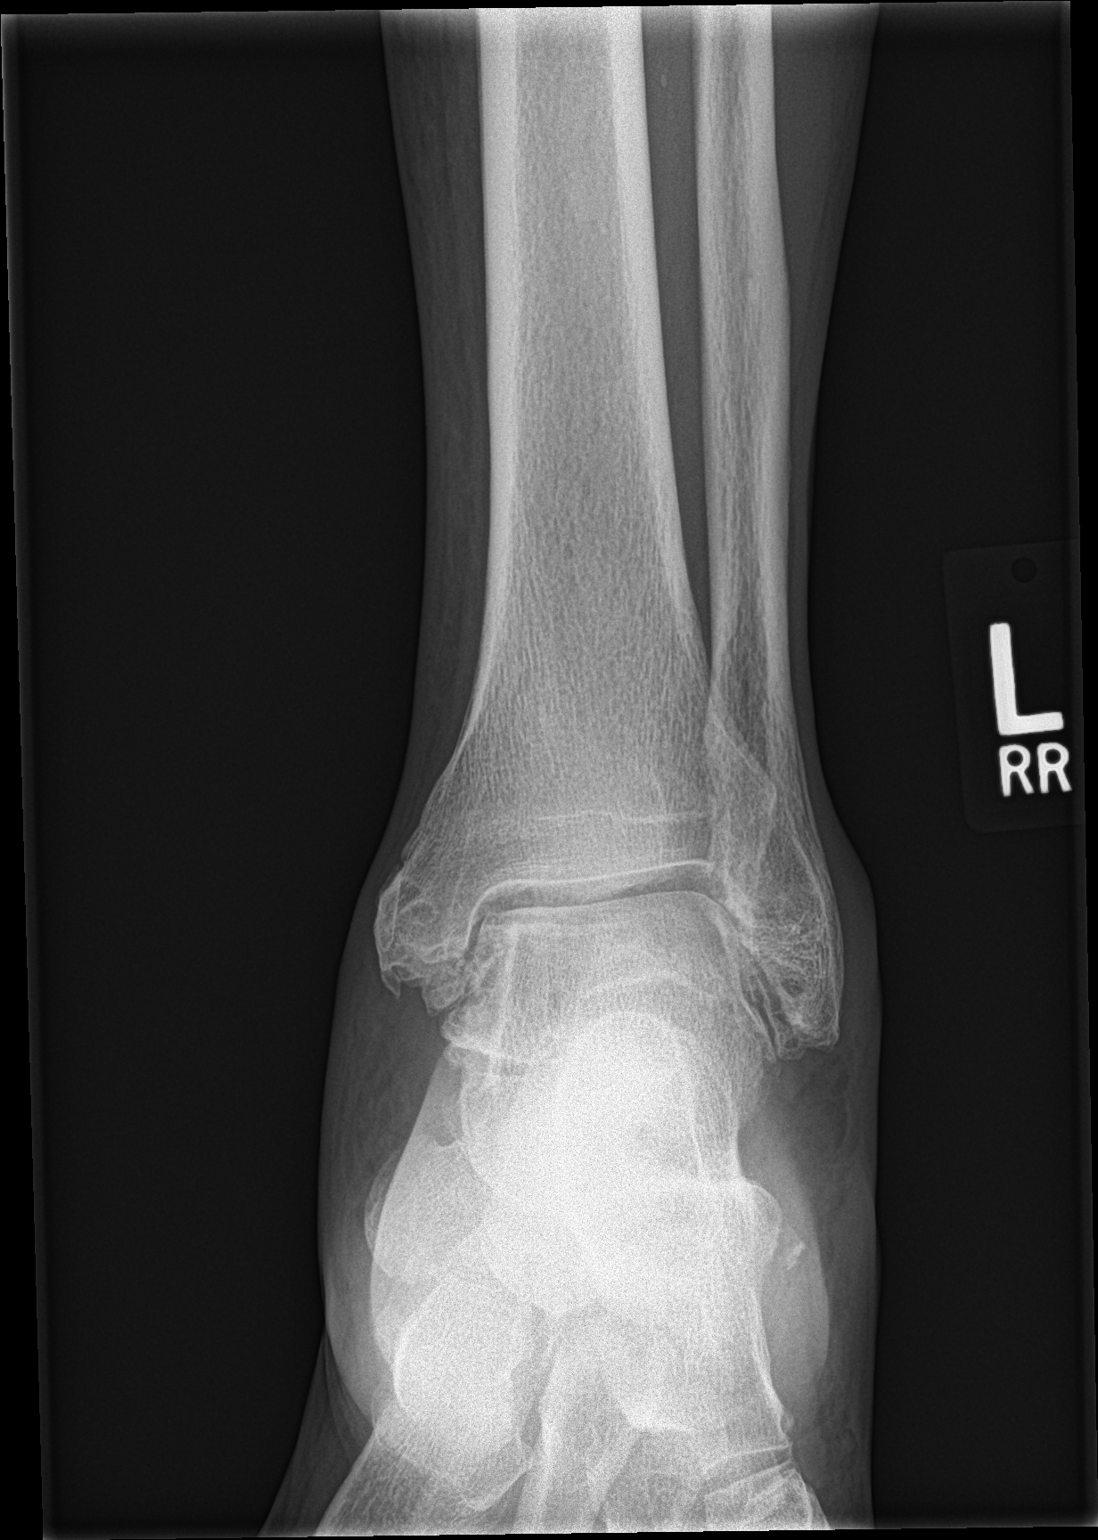

[ankle obl]
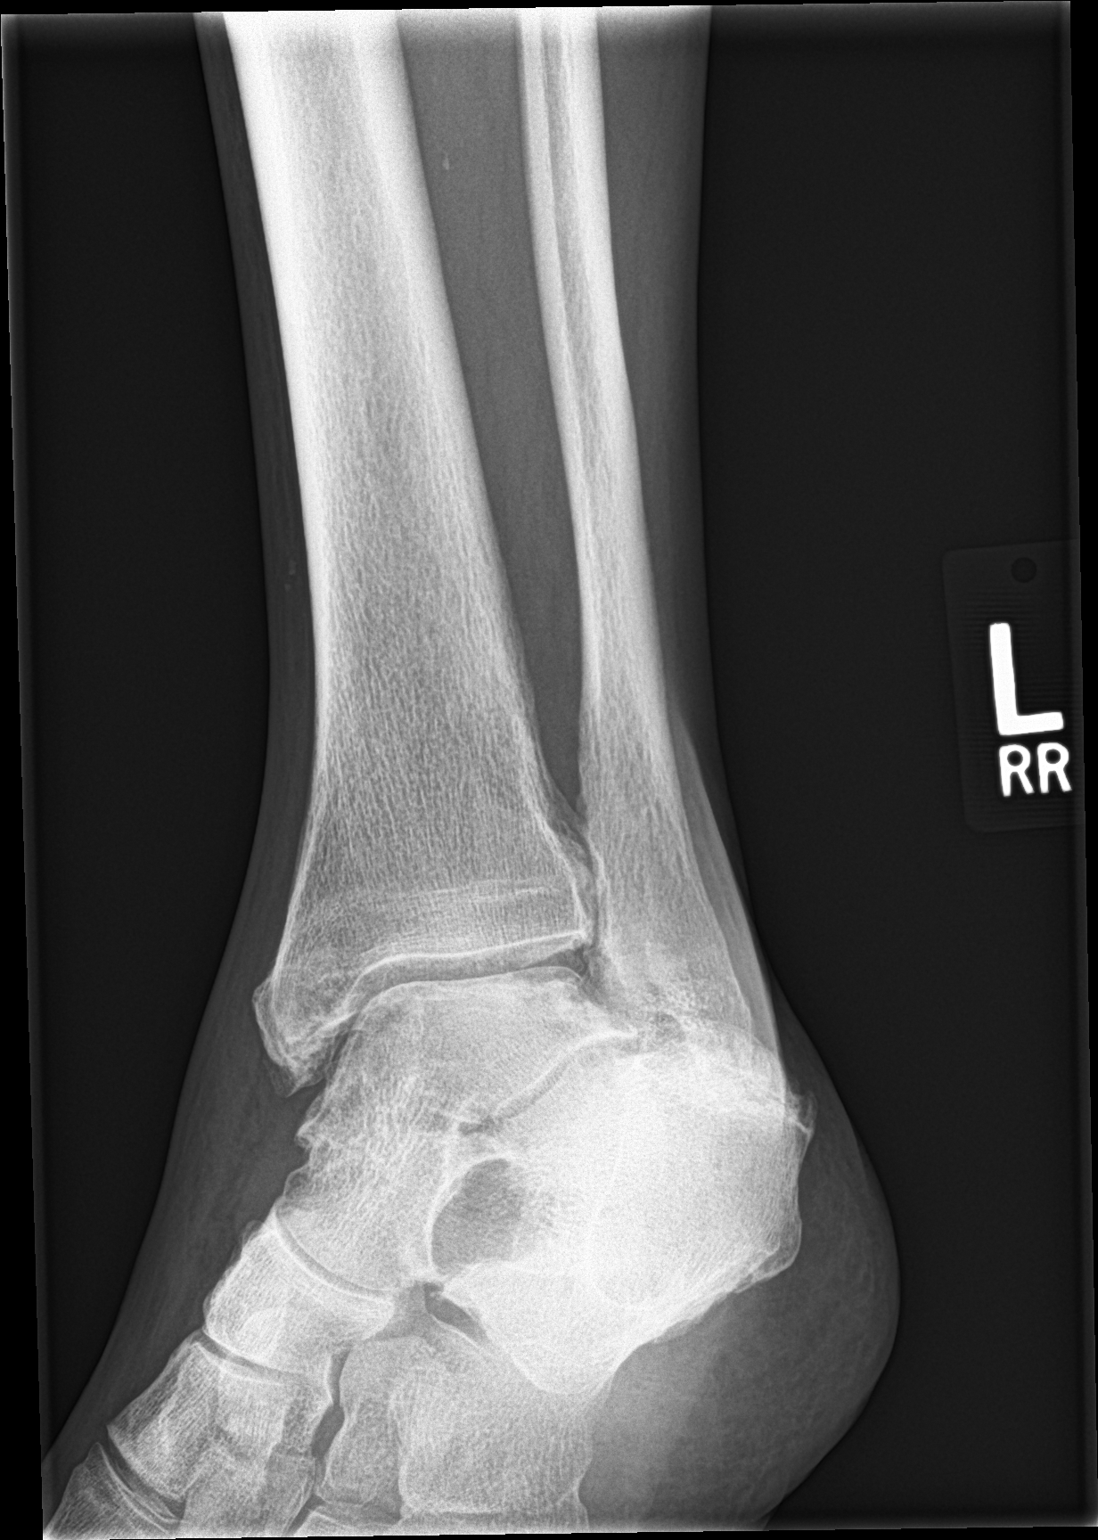

[ankle lat]
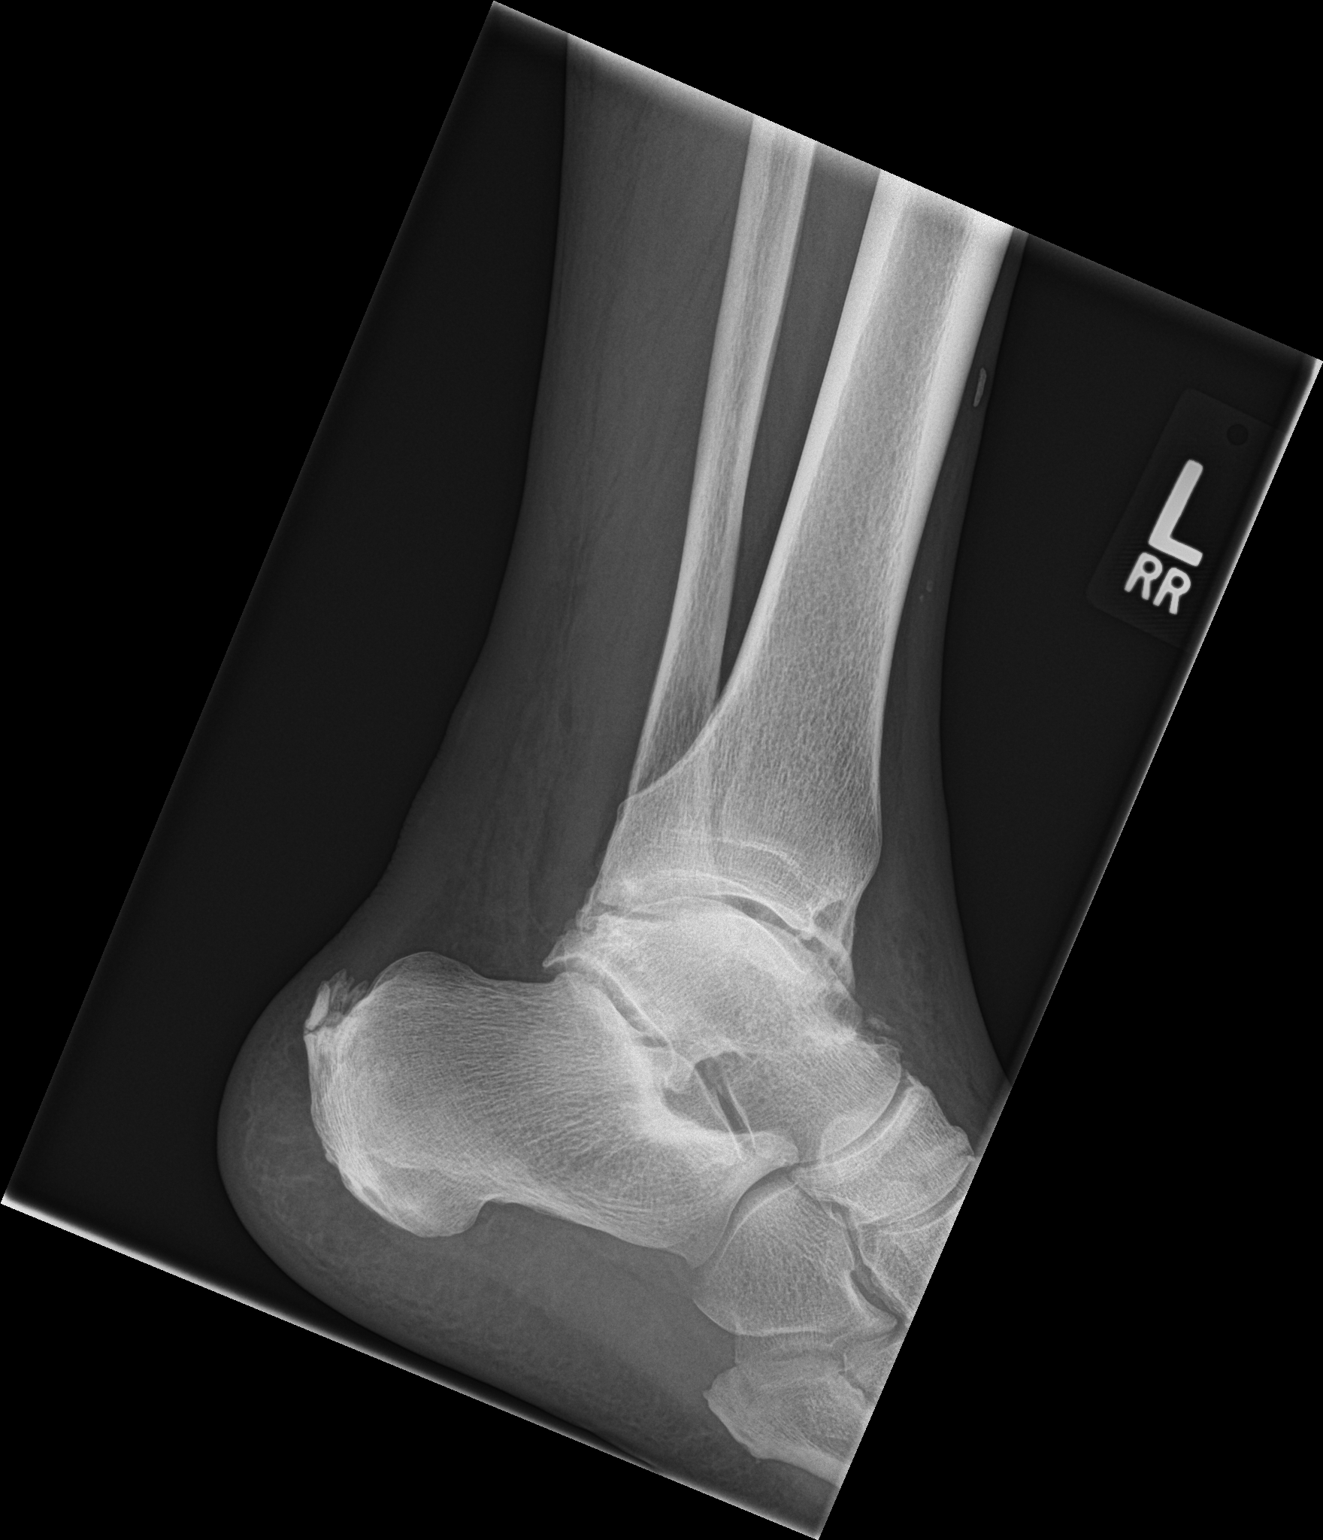

[3 of 3 positions shown; findings below may reference images not displayed]

FINDINGS: Severe osteophytosis about the left ankle appears chronic. This
includes subtalar and calcaneus degenerative spurring. The lateral
view is somewhat oblique but mortise joint alignment appears
preserved. No acute fracture of the talar dome identified. No acute
fracture of the distal tibia or fibula identified.

There is a conspicuous bone fragment along the lateral aspect of the
anterior calcaneus (image 1). Otherwise the calcaneus appears intact
with degenerative spurring.
IMPRESSION: 1. Appearance suspicious for avulsion injury of the left extensor
digitorum brevis with small avulsion fragment at the anterolateral
calcaneus.
2. Severe degenerative changes at the left ankle. No other acute
fracture or dislocation identified.

## 2018-10-05 ENCOUNTER — Ambulatory Visit (INDEPENDENT_AMBULATORY_CARE_PROVIDER_SITE_OTHER): Payer: Medicare HMO | Admitting: Family Medicine

## 2018-10-05 ENCOUNTER — Telehealth: Payer: Self-pay | Admitting: *Deleted

## 2018-10-05 ENCOUNTER — Encounter: Payer: Self-pay | Admitting: Family Medicine

## 2018-10-05 ENCOUNTER — Other Ambulatory Visit: Payer: Self-pay

## 2018-10-05 VITALS — BP 128/82 | HR 72 | Temp 98.0°F | Resp 16 | Ht 69.0 in | Wt 224.0 lb

## 2018-10-05 DIAGNOSIS — Z125 Encounter for screening for malignant neoplasm of prostate: Secondary | ICD-10-CM | POA: Diagnosis not present

## 2018-10-05 DIAGNOSIS — Z23 Encounter for immunization: Secondary | ICD-10-CM

## 2018-10-05 DIAGNOSIS — Z08 Encounter for follow-up examination after completed treatment for malignant neoplasm: Secondary | ICD-10-CM

## 2018-10-05 DIAGNOSIS — R7303 Prediabetes: Secondary | ICD-10-CM | POA: Diagnosis not present

## 2018-10-05 DIAGNOSIS — R350 Frequency of micturition: Secondary | ICD-10-CM

## 2018-10-05 DIAGNOSIS — Z1211 Encounter for screening for malignant neoplasm of colon: Secondary | ICD-10-CM | POA: Diagnosis not present

## 2018-10-05 DIAGNOSIS — I10 Essential (primary) hypertension: Secondary | ICD-10-CM | POA: Diagnosis not present

## 2018-10-05 DIAGNOSIS — M1A9XX0 Chronic gout, unspecified, without tophus (tophi): Secondary | ICD-10-CM | POA: Diagnosis not present

## 2018-10-05 DIAGNOSIS — Z8546 Personal history of malignant neoplasm of prostate: Secondary | ICD-10-CM | POA: Diagnosis not present

## 2018-10-05 DIAGNOSIS — M2560 Stiffness of unspecified joint, not elsewhere classified: Secondary | ICD-10-CM

## 2018-10-05 LAB — POCT URINALYSIS DIP (MANUAL ENTRY)
BILIRUBIN UA: NEGATIVE mg/dL
Bilirubin, UA: NEGATIVE
Blood, UA: NEGATIVE
GLUCOSE UA: NEGATIVE mg/dL
Leukocytes, UA: NEGATIVE
Nitrite, UA: NEGATIVE
Spec Grav, UA: 1.02 (ref 1.010–1.025)
Urobilinogen, UA: 0.2 E.U./dL
pH, UA: 7 (ref 5.0–8.0)

## 2018-10-05 LAB — POCT GLYCOSYLATED HEMOGLOBIN (HGB A1C): Hemoglobin A1C: 5.9 % — AB (ref 4.0–5.6)

## 2018-10-05 MED ORDER — COLCHICINE 0.6 MG PO TABS: 0.6000 mg | ORAL_TABLET | Freq: Every day | ORAL | 1 refills | Status: DC

## 2018-10-05 MED ORDER — AMLODIPINE BESYLATE 10 MG PO TABS: 10.0000 mg | ORAL_TABLET | Freq: Every day | ORAL | 1 refills | Status: DC

## 2018-10-05 MED ORDER — NAPROXEN 500 MG PO TABS: ORAL_TABLET | ORAL | 1 refills | Status: DC

## 2018-10-05 MED ORDER — KETOCONAZOLE 2 % EX SHAM: 1.0000 "application " | MEDICATED_SHAMPOO | CUTANEOUS | 1 refills | Status: DC

## 2018-10-05 MED ORDER — ALLOPURINOL 300 MG PO TABS: 300.0000 mg | ORAL_TABLET | Freq: Every day | ORAL | 1 refills | Status: DC

## 2018-10-05 NOTE — Patient Instructions (Signed)
° ° ° °  If you have lab work done today you will be contacted with your lab results within the next 2 weeks.  If you have not heard from us then please contact us. The fastest way to get your results is to register for My Chart. ° ° °IF you received an x-ray today, you will receive an invoice from [facility]. Please contact  Radiology at [phone] with questions or concerns regarding your invoice.  ° °IF you received labwork today, you will receive an invoice from [facility]. Please contact [facility] at [phone] with questions or concerns regarding your invoice.  ° °Our billing staff will not be able to assist you with questions regarding bills from these companies. ° °You will be contacted with the lab results as soon as they are available. The fastest way to get your results is to activate your My Chart account. Instructions are located on the last page of this paperwork. If you have not heard from us regarding the results in 2 weeks, please contact this office. °  ° ° ° °

## 2018-10-05 NOTE — Progress Notes (Signed)
Established Patient Office Visit  Subjective:  Patient ID: Jerry Chapman, male    DOB: 1951-05-04  Age: 68 y.o. MRN: [id number]  CC:  Chief Complaint  Patient presents with  . Hypertension    follow-up     HPI Jerry Chapman presents for   Hypertension: Patient here for follow-up of elevated blood pressure. Blood pressure is well controlled at home. Cardiac symptoms none. Patient denies chest pain, claudication, exertional chest pressure/discomfort, irregular heart beat, lower extremity edema, near-syncope and orthopnea.  Cardiovascular risk factors: advanced age (older than 28 for men, 27 for women), hypertension, male gender and obesity (BMI >= 30 kg/m2). Use of agents associated with hypertension: none.  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 he is quite active He does not do additional exercise He is watching out for salty food  Chronic Gout - now stable He denies any gout attacks He eats shrimps and fish He takes naproxen, allopurinol and colchicine every day because when he does not take it then the flare up comes right back  Colon Cancer Screening He has never had a colonoscopy He denies blood in his stool, unexpected weight loss or pain with defecation No rectal itching He does not smoke He does not have a family history of colon cancer  History of prostate cancer He has a history of prostate cancer  He had the radioactive treatment He has some urinary frequency He has not followed up with [facility] in at least 5 years They manage his prostate He would like his blood test done today  He wants to go back to Urology if he can    Past Medical History:  Diagnosis Date  . Arthritis, gouty    as of 02-07-2016  . At risk for sleep apnea    STOP-BANG= 5      SENT TO PCP 02-07-2016  . Bilateral hydrocele   .  Frequency of urination   . History of prostate cancer UROLOGIST--  DR OKarsten Ro-  last PSA in 2014 (0.79)   dx 2004--  T1c, Gleason 3+3, PSA 10.5  --  completed IM radiation therapy 04/ 2006  . Hypertension   . Right rotator cuff tear    per pt will be scheduling surgery this summer    Past Surgical History:  Procedure Laterality Date  . CIRCUMCISION N/A 07/05/2013   Procedure: CIRCUMCISION ADULT, aspiration of right hydrocele, meatal dilitation;  Surgeon: MClaybon Jabs MD;  [place] [facility]  Service: Urology;  Laterality: N/A;  . CYSTOSCOPY  02/12/2016   Procedure: CYSTOSCOPY;  Surgeon: MKathie Rhodes MD;  [facility]  Service: Urology;;  . HCarollee HerterEXCISION Bilateral 02/12/2016   Procedure: HYDROCELECTOMY ADULT;  Surgeon: MKathie Rhodes MD;  [facility]  Service: Urology;  Laterality: Bilateral;    Family History  Problem Relation Age of Onset  . Colon cancer Neg Hx   . Pancreatic cancer Neg Hx   . Stomach cancer Neg Hx   . Esophageal cancer Neg Hx     Social History   Socioeconomic History  . Marital status: Divorced    Spouse name: Not on file  . Number of children: Not on file  . Years of education: Not on file  . Highest education level: Not on file  Occupational History  .  Occupation: Museum/gallery curator  . Financial resource strain: Not on file  . Food insecurity:    Worry: Not on file    Inability: Not on file  . Transportation needs:    Medical: Not on file    Non-medical: Not on file  Tobacco Use  . Smoking status: Never Smoker  . Smokeless tobacco: Never Used  Substance and Sexual Activity  . Alcohol use: Yes    Alcohol/week: 2.0 standard drinks    Types: 2 Standard drinks or equivalent per week  . Drug use: No  . Sexual activity: Not on file  Lifestyle  . Physical activity:    Days per week: Not on file    Minutes per session: Not on file  . Stress: Not on file    Relationships  . Social connections:    Talks on phone: Not on file    Gets together: Not on file    Attends religious service: Not on file    Active member of club or organization: Not on file    Attends meetings of clubs or organizations: Not on file    Relationship status: Not on file  . Intimate partner violence:    Fear of current or ex partner: Not on file    Emotionally abused: Not on file    Physically abused: Not on file    Forced sexual activity: Not on file  Other Topics Concern  . 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    Lives at home byself    Outpatient Medications Prior to Visit  Medication Sig Dispense Refill  . Naproxen Sodium (ALEVE PO) Take 480 mg by mouth.    Marland Kitchen allopurinol (ZYLOPRIM) 300 MG tablet Take 1 tablet (300 mg total) by mouth daily. 90 tablet 1  . amLODipine (NORVASC) 10 MG tablet Take 1 tablet (10 mg total) by mouth daily. 90 tablet 1  . colchicine (COLCRYS) 0.6 MG tablet Take 1 tablet (0.6 mg total) by mouth daily. 90 tablet 1  . ketoconazole (NIZORAL) 2 % shampoo Apply 1 application topically 2 (two) times a week. 360 mL 1  . naproxen (NAPROSYN) 500 MG tablet TAKE 1 TABLET(500 MG) BY MOUTH TWICE DAILY WITH A MEAL 90 tablet 1   No facility-administered medications prior to visit.     No Known Allergies  ROS Review of Systems Review of Systems  Constitutional: Negative for activity change, appetite change, chills and fever.  HENT: Negative for congestion, nosebleeds, trouble swallowing and voice change.   Respiratory: Negative for cough, shortness of breath and wheezing.   Gastrointestinal: Negative for diarrhea, nausea and vomiting.  Genitourinary: see hpi Musculoskeletal: Negative for back pain, joint swelling and neck pain.  Neurological: Negative for dizziness, speech difficulty, light-headedness and numbness.  See HPI. All other review of systems negative.     Objective:    Physical  Exam  BP 128/82   Pulse 72   Temp 98 F (36.7 C) (Oral)   Resp 16   Ht 5' 9"  (1.753 m)   Wt 224 lb (101.6 kg)   SpO2 100%   BMI 33.08 kg/m  Wt Readings from Last 3 Encounters:  10/05/18 224 lb (101.6 kg)  04/09/18 231 lb (104.8 kg)  01/16/17 223 lb 9.6 oz (101.4 kg)   Physical Exam  Constitutional: Oriented to person, place, and time. Appears well-developed and well-nourished.  HENT:  Head: Normocephalic and atraumatic.  Eyes: Conjunctivae and  EOM are normal.  Cardiovascular: Normal rate, regular rhythm, normal heart sounds and intact distal pulses.  No murmur heard. Pulmonary/Chest: Effort normal and breath sounds normal. No stridor. No respiratory distress. Has no wheezes.  Abdomen: obese, nondistended, normoactive bs, soft, nontender Neurological: Is alert and oriented to person, place, and time.  Skin: Skin is warm. Capillary refill takes less than 2 seconds.  Psychiatric: Has a normal mood and affect. Behavior is normal. Judgment and thought content normal.     Health Maintenance Due  Topic Date Due  . TETANUS/TDAP  08/26/2010  . PNA vac Low Risk Adult (1 of 2 - PCV13) 04/20/2015  . COLONOSCOPY  07/19/2016    There are no preventive care reminders to display for this patient.  Lab Results  Component Value Date   TSH 1.247 07/16/2015   Lab Results  Component Value Date   WBC 8.5 04/09/2018   HGB 16.0 04/09/2018   HCT 48.3 04/09/2018   MCV 73 (L) 04/09/2018   PLT 254 04/09/2018   Lab Results  Component Value Date   NA 138 04/09/2018   K 4.3 04/09/2018   CO2 20 04/09/2018   GLUCOSE 107 (H) 04/09/2018   BUN 13 04/09/2018   CREATININE 1.17 04/09/2018   BILITOT 0.5 07/16/2015   ALKPHOS 62 07/16/2015   AST 25 07/16/2015   ALT 30 07/16/2015   PROT 7.5 07/16/2015   ALBUMIN 4.4 04/09/2018   CALCIUM 9.2 04/09/2018   Lab Results  Component Value Date   CHOL 158 04/09/2018   Lab Results  Component Value Date   HDL 31 (L) 04/09/2018   Lab Results    Component Value Date   LDLCALC 91 04/09/2018   Lab Results  Component Value Date   TRIG 179 (H) 04/09/2018   Lab Results  Component Value Date   CHOLHDL 5.1 (H) 04/09/2018   Lab Results  Component Value Date   HGBA1C 5.9 (A) 10/05/2018      Assessment & Plan:   Problem List Items Addressed This Visit      Cardiovascular and Mediastinum   HTN (hypertension) - Primary   Relevant Medications   amLODipine (NORVASC) 10 MG tablet   Other Relevant Orders   Lipid panel   CMP14+EGFR     Other   Gout   Relevant Medications   allopurinol (ZYLOPRIM) 300 MG tablet   colchicine (COLCRYS) 0.6 MG tablet    Other Visit Diagnoses    Prediabetes    -  showing improvement  Continue diet and exercise    Relevant Orders   POCT glycosylated hemoglobin (Hb A1C) (Completed)   Special screening for malignant neoplasms, colon    - discussed screening for colon cancer    Relevant Orders   Ambulatory referral to Gastroenterology   Need for vaccination       Relevant Orders   Td vaccine greater than or equal to 7yo preservative free IM (Completed)   Urinary frequency    -  Discussed urinary symptom, UA normal, advised follow up with Urology   Relevant Orders   POCT urinalysis dipstick (Completed)   Surveillance for malignant neoplasm of prostate    -  History of prostate cancer  Pt getting psa for surveillance   Relevant Orders   PSA   Ambulatory referral to Urology   History of prostate cancer       Relevant Orders   PSA   Ambulatory referral to Urology   Joint stiffness  Relevant Medications   naproxen (NAPROSYN) 500 MG tablet      Meds ordered this encounter  Medications  . ketoconazole (NIZORAL) 2 % shampoo    Sig: Apply 1 application topically 2 (two) times a week.    Dispense:  360 mL    Refill:  1    Store refill on file  . amLODipine (NORVASC) 10 MG tablet    Sig: Take 1 tablet (10 mg total) by mouth daily.    Dispense:  90 tablet    Refill:  1    Store  refill on file  . allopurinol (ZYLOPRIM) 300 MG tablet    Sig: Take 1 tablet (300 mg total) by mouth daily.    Dispense:  90 tablet    Refill:  1    Store refill on file  . colchicine (COLCRYS) 0.6 MG tablet    Sig: Take 1 tablet (0.6 mg total) by mouth daily.    Dispense:  90 tablet    Refill:  1    Store refill on file  . naproxen (NAPROSYN) 500 MG tablet    Sig: TAKE 1 TABLET(500 MG) BY MOUTH ONCE DAILY WITH A MEAL    Dispense:  90 tablet    Refill:  1    Store refill on file    Follow-up: Return in about 3 months (around 01/03/2019) for medicare wellness exam .    Forrest Moron, MD

## 2018-10-05 NOTE — Telephone Encounter (Signed)
10/05/2018 - PATIENT WAS SEEN BY DR. Gwendolyn Fill STALLINGS ON 10/05/2018. SHE HAS REQUESTED PATIENT TO HAVE AN ANNUAL MEDICARE WELLNESS VISIT IN 3 MONTHS.  BEST PHONE [phone] (CELL) [place]

## 2018-10-06 LAB — CMP14+EGFR
ALK PHOS: 70 IU/L (ref 39–117)
ALT: 47 IU/L — ABNORMAL HIGH (ref 0–44)
AST: 45 IU/L — ABNORMAL HIGH (ref 0–40)
Albumin/Globulin Ratio: 1.8 (ref 1.2–2.2)
Albumin: 4.6 g/dL (ref 3.8–4.8)
BUN/Creatinine Ratio: 14 (ref 10–24)
BUN: 17 mg/dL (ref 8–27)
Bilirubin Total: 0.4 mg/dL (ref 0.0–1.2)
CO2: 24 mmol/L (ref 20–29)
CREATININE: 1.21 mg/dL (ref 0.76–1.27)
Calcium: 9.6 mg/dL (ref 8.6–10.2)
Chloride: 100 mmol/L (ref 96–106)
GFR calc Af Amer: 71 mL/min/{1.73_m2} (ref >59)
GFR calc non Af Amer: 61 mL/min/{1.73_m2} (ref >59)
Globulin, Total: 2.6 g/dL (ref 1.5–4.5)
Glucose: 132 mg/dL — ABNORMAL HIGH (ref 65–99)
Potassium: 4.6 mmol/L (ref 3.5–5.2)
SODIUM: 137 mmol/L (ref 134–144)
Total Protein: 7.2 g/dL (ref 6.0–8.5)

## 2018-10-06 LAB — LIPID PANEL
Chol/HDL Ratio: 4.4 ratio (ref 0.0–5.0)
Cholesterol, Total: 146 mg/dL (ref 100–199)
HDL: 33 mg/dL — ABNORMAL LOW (ref >39)
LDL Calculated: 82 mg/dL (ref 0–99)
Triglycerides: 157 mg/dL — ABNORMAL HIGH (ref 0–149)
VLDL CHOLESTEROL CAL: 31 mg/dL (ref 5–40)

## 2018-10-06 LAB — PSA: Prostate Specific Ag, Serum: 2.4 ng/mL (ref 0.0–4.0)

## 2018-10-06 NOTE — Progress Notes (Signed)
Please mail lab letter with results and the following:  Your cholesterol is good. Your triglycerides improved and is now almost at goal of 150 or less. Your kidney function is good. Your prostate test is normal.  Please bring your result with you to the Urologist.  Hemoglobin A1c tells [place] how much glucose is chemically attached to hemoglobin, which is the main protein in your red blood cells. This is a very good measure of your average blood glucose (sugar) level throughout the 24 hour day, extending back over the past 10 weeks. Your hemoglobin a1c is 5.9% which is improved from 6.4%.  Continue a healthy diet and exercise.  Your urine test did not show any infection or blood.

## 2018-10-12 ENCOUNTER — Encounter: Payer: Self-pay | Admitting: Family Medicine

## 2018-10-12 ENCOUNTER — Ambulatory Visit (INDEPENDENT_AMBULATORY_CARE_PROVIDER_SITE_OTHER): Payer: Medicare HMO | Admitting: Family Medicine

## 2018-10-12 VITALS — BP 138/78 | Ht 69.75 in | Wt 228.6 lb

## 2018-10-12 DIAGNOSIS — Z0000 Encounter for general adult medical examination without abnormal findings: Secondary | ICD-10-CM | POA: Diagnosis not present

## 2018-10-12 NOTE — Progress Notes (Signed)
Presents today for Medicare Annual Wellness Visit     Patient Care Team: Forrest Moron, MD as PCP - General Kathie Rhodes, MD as Consulting Physician (Urology)    Immunization status:  Immunization History  Administered Date(s) Administered  . Influenza,inj,Quad PF,6+ Mos 06/11/2016  . Pneumococcal Conjugate-13 10/05/2018  . Td 10/05/2018  . Tdap 08/26/2000     Health Maintenance Due  Topic Date Due  . COLONOSCOPY  07/19/2016     Functional Status Survey: Is the patient deaf or have difficulty hearing?: No Does the patient have difficulty concentrating, remembering, or making decisions?: No Does the patient have difficulty walking or climbing stairs?: No Does the patient have difficulty dressing or bathing?: No Does the patient have difficulty doing errands alone such as visiting a doctor's office or shopping?: No   6CIT Screen 10/12/2018  What Year? 0 points  What month? 0 points  What time? 0 points  Count back from 20 0 points  Months in reverse 0 points  Repeat phrase 0 points  Total Score 0        Clinical Support from 10/12/2018 in Primary Care at [facility]  AUDIT-C Score  6        Patient Active Problem List   Diagnosis Date Noted  . Injury of left leg 01/16/2017  . Closed fracture of bone of left foot 01/16/2017  . OSA (obstructive sleep apnea) 08/05/2016  . Oxygen desaturation during REM sleep 08/05/2016  . Morbid obesity ([place]) 08/05/2016  . Dermatitis 06/11/2016  . Class 1 obesity due to excess calories without serious comorbidity with body mass index (BMI) of 32.0 to 32.9 in adult 06/11/2016  . Morbid obesity due to excess calories ([place]) 03/20/2016  . Snoring 03/20/2016  . Sleep apnea 03/20/2016  . Hypersomnia with sleep apnea 03/20/2016  . Hematuria 02/26/2016  . Adenocarcinoma of prostate ([place]) 07/16/2015  . Acquired phimosis 07/05/2013  . HTN (hypertension) 05/09/2013  . Gout 05/09/2013  . Renal insufficiency 05/09/2013      Past Medical History:  Diagnosis Date  . Arthritis, gouty    as of 02-07-2016  . At risk for sleep apnea    STOP-BANG= 5      SENT TO PCP 02-07-2016  . Bilateral hydrocele   . Frequency of urination   . History of prostate cancer UROLOGIST--  DR Karsten Ro--  last PSA in 2014 (0.79)   dx 2004--  T1c, Gleason 3+3, PSA 10.5  --  completed IM radiation therapy 04/ 2006  . Hypertension   . Right rotator cuff tear    per pt will be scheduling surgery this summer     Past Surgical History:  Procedure Laterality Date  . CIRCUMCISION N/A 07/05/2013   Procedure: CIRCUMCISION ADULT, aspiration of right hydrocele, meatal dilitation;  Surgeon: Claybon Jabs, MD;  Location: [place];  Service: Urology;  Laterality: N/A;  . CYSTOSCOPY  02/12/2016   Procedure: CYSTOSCOPY;  Surgeon: Kathie Rhodes, MD;  Location: [facility];  Service: Urology;;  . Carollee Herter EXCISION Bilateral 02/12/2016   Procedure: HYDROCELECTOMY ADULT;  Surgeon: Kathie Rhodes, MD;  Location: [facility];  Service: Urology;  Laterality: Bilateral;     Family History  Problem Relation Age of Onset  . Colon cancer Neg Hx   . Pancreatic cancer Neg Hx   . Stomach cancer Neg Hx   . Esophageal cancer Neg Hx      Social History   Socioeconomic History  .  Marital status: Divorced    Spouse name: Not on file  . Number of children: Not on file  . Years of education: Not on file  . Highest education level: Not on file  Occupational History  . Occupation: Museum/gallery curator  . Financial resource strain: Not on file  . Food insecurity:    Worry: Not on file    Inability: Not on file  . Transportation needs:    Medical: Not on file    Non-medical: Not on file  Tobacco Use  . Smoking status: Never Smoker  . Smokeless tobacco: Never Used  Substance and Sexual Activity  . Alcohol use: Yes    Alcohol/week: 2.0 standard drinks    Types: 2 Standard drinks or equivalent  per week  . Drug use: No  . Sexual activity: Not on file  Lifestyle  . Physical activity:    Days per week: Not on file    Minutes per session: Not on file  . Stress: Not on file  Relationships  . Social connections:    Talks on phone: Not on file    Gets together: Not on file    Attends religious service: Not on file    Active member of club or organization: Not on file    Attends meetings of clubs or organizations: Not on file    Relationship status: Not on file  . Intimate partner violence:    Fear of current or ex partner: Not on file    Emotionally abused: Not on file    Physically abused: Not on file    Forced sexual activity: Not on file  Other Topics Concern  . 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    Lives at home byself     No Known Allergies   Prior to Admission medications   Medication Sig Start Date End Date Taking? Authorizing Provider  allopurinol (ZYLOPRIM) 300 MG tablet Take 1 tablet (300 mg total) by mouth daily. 10/05/18  Yes Stallings, Zoe A, MD  amLODipine (NORVASC) 10 MG tablet Take 1 tablet (10 mg total) by mouth daily. 10/05/18  Yes Stallings, Zoe A, MD  colchicine (COLCRYS) 0.6 MG tablet Take 1 tablet (0.6 mg total) by mouth daily. 10/05/18  Yes Stallings, Zoe A, MD  ketoconazole (NIZORAL) 2 % shampoo Apply 1 application topically 2 (two) times a week. 10/05/18  Yes Stallings, Zoe A, MD  naproxen (NAPROSYN) 500 MG tablet TAKE 1 TABLET(500 MG) BY MOUTH ONCE DAILY WITH A MEAL 10/05/18  Yes Stallings, Zoe A, MD  Naproxen Sodium (ALEVE PO) Take 480 mg by mouth.   Yes [provider]     Depression screen [facility] 2/9 10/12/2018 10/05/2018 01/16/2017 10/10/2016 06/11/2016  Decreased Interest 0 0 0 0 0  Down, Depressed, Hopeless 0 0 0 0 0  PHQ - 2 Score 0 0 0 0 0     Fall Risk  10/12/2018 10/05/2018 01/16/2017 10/10/2016 06/11/2016  Falls in the past year? 0 0 Yes No Yes  Number falls in past yr: 0 - 1 - 1   Injury with Fall? 0 0 Yes - Yes  Comment - - - - rotater cuff  Follow up Education provided;Falls evaluation completed - - - -      PHYSICAL EXAM: Ht 5' 9.75" (1.772 m)   Wt 228 lb 9.6 oz (103.7 kg)   BMI 33.04 kg/m    Wt Readings from Last 3  Encounters:  10/12/18 228 lb 9.6 oz (103.7 kg)  10/05/18 224 lb (101.6 kg)  04/09/18 231 lb (104.8 kg)     No exam data present    Physical Exam   Education/Counseling provided regarding diet and exercise, prevention of chronic diseases, smoking/tobacco cessation, if applicable, and reviewed "Covered Medicare Preventive Services."   ASSESSMENT/PLAN: There are no diagnoses linked to this encounter.      Works full-time as Chief Strategy Officer. Lives by himself 1 story home  No trouble with climbing stairs  No grab bars No scattered rug well light  30 second timed get up.  No pain   Will be scheduling Colonoscopy this month.  No eye doctor will schedule to have eyes checked been two years.

## 2018-10-12 NOTE — Patient Instructions (Addendum)
Healthy Eating Following a healthy eating pattern may help you to achieve and maintain a healthy body weight, reduce the risk of chronic disease, and live a long and productive life. It is important to follow a healthy eating pattern at an appropriate calorie level for your body. Your nutritional needs should be met primarily through food by choosing a variety of nutrient-rich foods. What are tips for following this plan? Reading food labels  Read labels and choose the following: ? Reduced or low sodium. ? Juices with 100% fruit juice. ? Foods with low saturated fats and high polyunsaturated and monounsaturated fats. ? Foods with whole grains, such as whole wheat, cracked wheat, brown rice, and wild rice. ? Whole grains that are fortified with folic acid. This is recommended for women who are pregnant or who want to become pregnant.  Read labels and avoid the following: ? Foods with a lot of added sugars. These include foods that contain brown sugar, corn sweetener, corn syrup, dextrose, fructose, glucose, high-fructose corn syrup, honey, invert sugar, lactose, malt syrup, maltose, molasses, raw sugar, sucrose, trehalose, or turbinado sugar.  Do not eat more than the following amounts of added sugar per day:  6 teaspoons (25 g) for women.  9 teaspoons (38 g) for men. ? Foods that contain processed or refined starches and grains. ? Refined grain products, such as white flour, degermed cornmeal, white bread, and white rice. Shopping  Choose nutrient-rich snacks, such as vegetables, whole fruits, and nuts. Avoid high-calorie and high-sugar snacks, such as potato chips, fruit snacks, and candy.  Use oil-based dressings and spreads on foods instead of solid fats such as butter, stick margarine, or cream cheese.  Limit pre-made sauces, mixes, and "instant" products such as flavored rice, instant noodles, and ready-made pasta.  Try more plant-protein sources, such as tofu, tempeh, black beans,  edamame, lentils, nuts, and seeds.  Explore eating plans such as the Mediterranean diet or vegetarian diet. Cooking  Use oil to saut or stir-fry foods instead of solid fats such as butter, stick margarine, or lard.  Try baking, boiling, grilling, or broiling instead of frying.  Remove the fatty part of meats before cooking.  Steam vegetables in water or broth. Meal planning   At meals, imagine dividing your plate into fourths: ? One-half of your plate is fruits and vegetables. ? One-fourth of your plate is whole grains. ? One-fourth of your plate is protein, especially lean meats, poultry, eggs, tofu, beans, or nuts.  Include low-fat dairy as part of your daily diet. Lifestyle  Choose healthy options in all settings, including home, work, school, restaurants, or stores.  Prepare your food safely: ? Wash your hands after handling raw meats. ? Keep food preparation surfaces clean by regularly washing with hot, soapy water. ? Keep raw meats separate from ready-to-eat foods, such as fruits and vegetables. ? Cook seafood, meat, poultry, and eggs to the recommended internal temperature. ? Store foods at safe temperatures. In general:  Keep cold foods at 37F (4.4C) or below.  Keep hot foods at 137F (60C) or above.  Keep your freezer at [facility] (-17.8C) or below.  Foods are no longer safe to eat when they have been between the temperatures of 40-137F (4.4-60C) for more than 2 hours. What foods should I eat? Fruits Aim to eat 2 cup-equivalents of fresh, canned (in natural juice), or frozen fruits each day. Examples of 1 cup-equivalent of fruit include 1 small apple, 8 large strawberries, 1 cup canned fruit,  cup  dried fruit, or 1 cup 100% juice. Vegetables Aim to eat 2-3 cup-equivalents of fresh and frozen vegetables each day, including different varieties and colors. Examples of 1 cup-equivalent of vegetables include 2 medium carrots, 2 cups raw, leafy greens, 1 cup chopped  vegetable (raw or cooked), or 1 medium baked potato. Grains Aim to eat 6 ounce-equivalents of whole grains each day. Examples of 1 ounce-equivalent of grains include 1 slice of bread, 1 cup ready-to-eat cereal, 3 cups popcorn, or  cup cooked rice, pasta, or cereal. Meats and other proteins Aim to eat 5-6 ounce-equivalents of protein each day. Examples of 1 ounce-equivalent of protein include 1 egg, 1/2 cup nuts or seeds, or 1 tablespoon (16 g) peanut butter. A cut of meat or fish that is the size of a deck of cards is about 3-4 ounce-equivalents.  Of the protein you eat each week, try to have at least 8 ounces come from seafood. This includes salmon, trout, herring, and anchovies. Dairy Aim to eat 3 cup-equivalents of fat-free or low-fat dairy each day. Examples of 1 cup-equivalent of dairy include 1 cup (240 mL) milk, 8 ounces (250 g) yogurt, 1 ounces (44 g) natural cheese, or 1 cup (240 mL) fortified soy milk. Fats and oils  Aim for about 5 teaspoons (21 g) per day. Choose monounsaturated fats, such as canola and olive oils, avocados, peanut butter, and most nuts, or polyunsaturated fats, such as sunflower, corn, and soybean oils, walnuts, pine nuts, sesame seeds, sunflower seeds, and flaxseed. Beverages  Aim for six 8-oz glasses of water per day. Limit coffee to three to five 8-oz cups per day.  Limit caffeinated beverages that have added calories, such as soda and energy drinks.  Limit alcohol intake to no more than 1 drink a day for nonpregnant women and 2 drinks a day for men. One drink equals 12 oz of beer (355 mL), 5 oz of wine (148 mL), or 1 oz of hard liquor (44 mL). Seasoning and other foods  Avoid adding excess amounts of salt to your foods. Try flavoring foods with herbs and spices instead of salt.  Avoid adding sugar to foods.  Try using oil-based dressings, sauces, and spreads instead of solid fats. This information is based on general [place] nutrition guidelines. For more  information, visit [employer].es. Exact amounts may vary based on your nutrition needs. Summary  A healthy eating plan may help you to maintain a healthy weight, reduce the risk of chronic diseases, and stay active throughout your life.  Plan your meals. Make sure you eat the right portions of a variety of nutrient-rich foods.  Try baking, boiling, grilling, or broiling instead of frying.  Choose healthy options in all settings, including home, work, school, restaurants, or stores. This information is not intended to replace advice given to you by your health care provider. Make sure you discuss any questions you have with your health care provider. Document Released: 11/24/2017 Document Revised: 11/24/2017 Document Reviewed: 11/24/2017 Elsevier Interactive Patient Education  2019 [place] Directive  Advance directives are legal documents that let you make choices ahead of time about your health care and medical treatment in case you become unable to communicate for yourself. Advance directives are a way for you to communicate your wishes to family, friends, and health care providers. This can help convey your decisions about end-of-life care if you become unable to communicate. Discussing and writing advance directives should happen over time rather than all at once. Advance directives can  be changed depending on your situation and what you want, even after you have signed the advance directives. If you do not have an advance directive, some states assign family decision makers to act on your behalf based on how closely you are related to them. Each state has its own laws regarding advance directives. You may want to check with your health care provider, attorney, or state representative about the laws in your state. There are different types of advance directives, such as:  Medical power of attorney.  Living will.  Do not resuscitate (DNR) or do not attempt resuscitation  (DNAR) order. Health care proxy and medical power of attorney A health care proxy, also called a health care agent, is a person who is appointed to make medical decisions for you in cases in which you are unable to make the decisions yourself. Generally, people choose someone they know well and trust to represent their preferences. Make sure to ask this person for an agreement to act as your proxy. A proxy may have to exercise judgment in the event of a medical decision for which your wishes are not known. A medical power of attorney is a legal document that names your health care proxy. Depending on the laws in your state, after the document is written, it may also need to be:  Signed.  Notarized.  Dated.  Copied.  Witnessed.  Incorporated into your medical record. You may also want to appoint someone to manage your financial affairs in a situation in which you are unable to do so. This is called a durable power of attorney for finances. It is a separate legal document from the durable power of attorney for health care. You may choose the same person or someone different from your health care proxy to act as your agent in financial matters. If you do not appoint a proxy, or if there is a concern that the proxy is not acting in your best interests, a court-appointed guardian may be designated to act on your behalf. Living will A living will is a set of instructions documenting your wishes about medical care when you cannot express them yourself. Health care providers should keep a copy of your living will in your medical record. You may want to give a copy to family members or friends. To alert caregivers in case of an emergency, you can place a card in your wallet to let them know that you have a living will and where they can find it. A living will is used if you become:  Terminally ill.  Incapacitated.  Unable to communicate or make decisions. Items to consider in your living will  include:  The use or non-use of life-sustaining equipment, such as dialysis machines and breathing machines (ventilators).  A DNR or DNAR order, which is the instruction not to use cardiopulmonary resuscitation (CPR) if breathing or heartbeat stops.  The use or non-use of tube feeding.  Withholding of food and fluids.  Comfort (palliative) care when the goal becomes comfort rather than a cure.  Organ and tissue donation. A living will does not give instructions for distributing your money and property if you should pass away. It is recommended that you seek the advice of a lawyer when writing a will. Decisions about taxes, beneficiaries, and asset distribution will be legally binding. This process can relieve your family and friends of any concerns surrounding disputes or questions that may come up about the distribution of your assets. DNR or DNAR A  DNR or DNAR order is a request not to have CPR in the event that your heart stops beating or you stop breathing. If a DNR or DNAR order has not been made and shared, a health care provider will try to help any patient whose heart has stopped or who has stopped breathing. If you plan to have surgery, talk with your health care provider about how your DNR or DNAR order will be followed if problems occur. Summary  Advance directives are the legal documents that allow you to make choices ahead of time about your health care and medical treatment in case you become unable to communicate for yourself.  The process of discussing and writing advance directives should happen over time. You can change the advance directives, even after you have signed them.  Advance directives include DNR or DNAR orders, living wills, and designating an agent as your medical power of attorney. This information is not intended to replace advice given to you by your health care provider. Make sure you discuss any questions you have with your health care provider. Document  Released: 11/19/2007 Document Revised: 07/01/2016 Document Reviewed: 07/01/2016 Elsevier Interactive Patient Education  2019 [place] you for taking time to come for your Medicare Wellness Visit. I appreciate your ongoing commitment to your health goals. Please review the following plan we discussed and let me know if I can assist you in the future.  Leroy Kennedy LPN

## 2018-10-20 DIAGNOSIS — C61 Malignant neoplasm of prostate: Secondary | ICD-10-CM | POA: Diagnosis not present

## 2018-11-03 ENCOUNTER — Encounter: Payer: Self-pay | Admitting: Family Medicine

## 2018-12-02 ENCOUNTER — Other Ambulatory Visit: Payer: Self-pay | Admitting: Physician Assistant

## 2018-12-02 DIAGNOSIS — M1A9XX0 Chronic gout, unspecified, without tophus (tophi): Secondary | ICD-10-CM

## 2019-01-27 NOTE — Progress Notes (Signed)
I was was available for the history and physical exam. I personally discussed this patient at the time of the office visit and agree with the assessment and plan.

## 2019-04-19 ENCOUNTER — Other Ambulatory Visit: Payer: Self-pay | Admitting: Family Medicine

## 2019-04-19 DIAGNOSIS — M1A9XX0 Chronic gout, unspecified, without tophus (tophi): Secondary | ICD-10-CM

## 2019-04-19 DIAGNOSIS — I10 Essential (primary) hypertension: Secondary | ICD-10-CM

## 2019-04-19 NOTE — Telephone Encounter (Signed)
Requested medication (s) are due for refill today: no  Requested medication (s) are on the active medication list: no  Last refill:  2 /05/2019  Future visit scheduled: no  Notes to clinic:  Review for refill   Requested Prescriptions  Pending Prescriptions Disp Refills   allopurinol (ZYLOPRIM) 300 MG tablet [Pharmacy Med Name: ALLOPURINOL 300MG  TABLETS] 90 tablet 1    Sig: TAKE 1 TABLET(300 MG) BY MOUTH DAILY     Endocrinology:  Gout Agents Failed - 04/19/2019 12:11 PM      Failed - Uric Acid in normal range and within 360 days    Uric Acid, Serum  Date Value Ref Range Status  11/07/2014 10.0 (H) 4.0 - 7.8 mg/dL Final         Passed - Cr in normal range and within 360 days    Creat  Date Value Ref Range Status  07/16/2015 1.31 (H) 0.70 - 1.25 mg/dL Final   Creatinine, Ser  Date Value Ref Range Status  10/05/2018 1.21 0.76 - 1.27 mg/dL Final         Passed - Valid encounter within last 12 months    Recent Outpatient Visits          6 months ago Medicare annual wellness visit, subsequent   [facility] at [place], MD   6 months ago Essential hypertension   [facility] at [place], MD   1 year ago Essential hypertension   Primary Care at [place], PA-C   2 years ago Injury of left lower extremity, initial encounter   Primary Care at [facility], Ines Bloomer, MD   2 years ago Inflamed skin tag   Primary Care at [facility], [place], MD

## 2019-04-21 DIAGNOSIS — C61 Malignant neoplasm of prostate: Secondary | ICD-10-CM | POA: Diagnosis not present

## 2019-05-12 ENCOUNTER — Other Ambulatory Visit: Payer: Self-pay | Admitting: Family Medicine

## 2019-05-12 DIAGNOSIS — M1A9XX0 Chronic gout, unspecified, without tophus (tophi): Secondary | ICD-10-CM

## 2019-05-12 DIAGNOSIS — I10 Essential (primary) hypertension: Secondary | ICD-10-CM

## 2019-05-12 NOTE — Telephone Encounter (Signed)
Requested medication (s) are due for refill today: yes  Requested medication (s) are on the active medication list: yes  Last refill:  04/19/2019  Future visit scheduled: no  Notes to clinic:  Review for refill   Requested Prescriptions  Pending Prescriptions Disp Refills   amLODipine (NORVASC) 10 MG tablet [Pharmacy Med Name: AMLODIPINE BESYLATE 10MG  TABLETS] 30 tablet 0    Sig: TAKE 1 TABLET(10 MG) BY MOUTH DAILY, NEED OFFICE VISIT     Cardiovascular:  Calcium Channel Blockers Failed - 05/12/2019  9:34 AM      Failed - Valid encounter within last 6 months    Recent Outpatient Visits          7 months ago Medicare annual wellness visit, subsequent   Primary Care at [facility], Arlie Solomons, MD   7 months ago Essential hypertension   Primary Care at [facility], Arlie Solomons, MD   1 year ago Essential hypertension   Primary Care at [place], PA-C   2 years ago Injury of left lower extremity, initial encounter   Primary Care at [facility], Ines Bloomer, MD   2 years ago Inflamed skin tag   Primary Care at [place], [place], MD             [place] BP in normal range    BP Readings from Last 1 Encounters:  10/12/18 138/78

## 2019-07-01 ENCOUNTER — Other Ambulatory Visit: Payer: Self-pay | Admitting: Family Medicine

## 2019-07-01 DIAGNOSIS — I10 Essential (primary) hypertension: Secondary | ICD-10-CM

## 2019-07-01 NOTE — Telephone Encounter (Signed)
Medication Refill - Medication: amLODipine (NORVASC) 10 MG tablet Pt stated he contacted his pharmacy last week for a refill and they have not gotten response from office. Pt has one tablet left.  Has the patient contacted their pharmacy? Yes.   (Agent: If no, request that the patient contact the pharmacy for the refill.) (Agent: If yes, when and what did the pharmacy advise?)  Preferred Pharmacy (with phone number or street name):  [facility] [place] [facility] [place] [phone] (Phone) [phone] (Fax)     Agent: Please be advised that RX refills may take up to 3 business days. We ask that you follow-up with your pharmacy.

## 2019-07-01 NOTE — Telephone Encounter (Signed)
Requested medication (s) are due for refill today: yes  Requested medication (s) are on the active medication list: yes  Last refill:  05/12/2019  Future visit scheduled: no  Notes to clinic:  Review for refill Overdue for office visit   Requested Prescriptions  Pending Prescriptions Disp Refills   amLODipine (NORVASC) 10 MG tablet 30 tablet 0     Cardiovascular:  Calcium Channel Blockers Failed - 07/01/2019 10:49 AM      Failed - Valid encounter within last 6 months    Recent Outpatient Visits          8 months ago Medicare annual wellness visit, subsequent   Primary Care at [facility], Arlie Solomons, MD   8 months ago Essential hypertension   Primary Care at [facility], Arlie Solomons, MD   1 year ago Essential hypertension   Primary Care at [place], PA-C   2 years ago Injury of left lower extremity, initial encounter   Primary Care at [facility], Ines Bloomer, MD   2 years ago Inflamed skin tag   Primary Care at [place], [place], MD             [place] BP in normal range    BP Readings from Last 1 Encounters:  10/12/18 138/78

## 2019-07-05 ENCOUNTER — Other Ambulatory Visit: Payer: Self-pay

## 2019-07-05 ENCOUNTER — Encounter: Payer: Self-pay | Admitting: Family Medicine

## 2019-07-05 ENCOUNTER — Encounter: Payer: Self-pay | Admitting: Internal Medicine

## 2019-07-05 ENCOUNTER — Ambulatory Visit (INDEPENDENT_AMBULATORY_CARE_PROVIDER_SITE_OTHER): Payer: Medicare HMO | Admitting: Family Medicine

## 2019-07-05 VITALS — BP 144/90 | HR 74 | Temp 98.5°F | Ht 69.0 in | Wt 224.4 lb

## 2019-07-05 DIAGNOSIS — M1A9XX0 Chronic gout, unspecified, without tophus (tophi): Secondary | ICD-10-CM | POA: Diagnosis not present

## 2019-07-05 DIAGNOSIS — Z8546 Personal history of malignant neoplasm of prostate: Secondary | ICD-10-CM | POA: Diagnosis not present

## 2019-07-05 DIAGNOSIS — R7303 Prediabetes: Secondary | ICD-10-CM

## 2019-07-05 DIAGNOSIS — M2560 Stiffness of unspecified joint, not elsewhere classified: Secondary | ICD-10-CM | POA: Diagnosis not present

## 2019-07-05 DIAGNOSIS — Z86010 Personal history of colonic polyps: Secondary | ICD-10-CM | POA: Diagnosis not present

## 2019-07-05 DIAGNOSIS — Z1211 Encounter for screening for malignant neoplasm of colon: Secondary | ICD-10-CM | POA: Diagnosis not present

## 2019-07-05 DIAGNOSIS — Z23 Encounter for immunization: Secondary | ICD-10-CM

## 2019-07-05 DIAGNOSIS — I10 Essential (primary) hypertension: Secondary | ICD-10-CM | POA: Diagnosis not present

## 2019-07-05 MED ORDER — AMLODIPINE BESYLATE 10 MG PO TABS: 10.0000 mg | ORAL_TABLET | Freq: Every day | ORAL | 1 refills | Status: DC

## 2019-07-05 MED ORDER — ALLOPURINOL 300 MG PO TABS: 300.0000 mg | ORAL_TABLET | Freq: Every day | ORAL | 1 refills | Status: AC

## 2019-07-05 MED ORDER — NAPROXEN 500 MG PO TABS: ORAL_TABLET | ORAL | 1 refills | Status: DC

## 2019-07-05 NOTE — Progress Notes (Signed)
11/9/202011:17 AM  Jerry Chapman Treat October 15, 1950, 68 y.o., male [id number]  Chief Complaint  Patient presents with  . Hypertension    Med refill     HPI:   Patient is a 68 y.o. male with past medical history significant for prediabetes, HTN, OSA not cpap, prostate cancer, OA, gout, who presents today for routine followup  Last saw Dr Nolon Rod Feb 2020 [facility] urology Aug 2020, dr Karsten Ro - low grade prostate, surveillance, q 6 months  Not using cpap Has been wo bp meds for 3 days Has not a gout attack in years, takes allopurinol and colchicine daily Takes naproxen daily for OA, multiple sites Has been trying to lose weight and cut back on sugars  Has had flu vaccine this season  BP Readings from Last 3 Encounters:  07/05/19 (!) 144/90  10/12/18 138/78  10/05/18 128/82    Lab Results  Component Value Date   HGBA1C 5.9 (A) 10/05/2018   Lab Results  Component Value Date   CREATININE 1.21 10/05/2018   BUN 17 10/05/2018   NA 137 10/05/2018   K 4.6 10/05/2018   CL 100 10/05/2018   CO2 24 10/05/2018   Lab Results  Component Value Date   ALT 47 (H) 10/05/2018   AST 45 (H) 10/05/2018   ALKPHOS 70 10/05/2018   BILITOT 0.4 10/05/2018   Lab Results  Component Value Date   CHOL 146 10/05/2018   HDL 33 (L) 10/05/2018   LDLCALC 82 10/05/2018   TRIG 157 (H) 10/05/2018   CHOLHDL 4.4 10/05/2018   Lab Results  Component Value Date   PSA 0.79 05/09/2013    Depression screen [facility] 2/9 07/05/2019 10/12/2018 10/05/2018  Decreased Interest 0 0 0  Down, Depressed, Hopeless 0 0 0  PHQ - 2 Score 0 0 0    Fall Risk  07/05/2019 10/12/2018 10/05/2018 01/16/2017 10/10/2016  Falls in the past year? 0 0 0 Yes No  Number falls in past yr: 0 0 - 1 -  Injury with Fall? 0 0 0 Yes -  Comment - - - - -  Follow up Falls evaluation completed Education provided;Falls evaluation completed - - -     No Known Allergies  Prior to Admission medications   Medication Sig Start Date End Date  Taking? Authorizing Provider  allopurinol (ZYLOPRIM) 300 MG tablet TAKE 1 TABLET(300 MG) BY MOUTH DAILY 05/12/19  Yes Stallings, Zoe A, MD  colchicine (COLCRYS) 0.6 MG tablet Take 1 tablet (0.6 mg total) by mouth daily. 10/05/18  Yes Stallings, Zoe A, MD  ketoconazole (NIZORAL) 2 % shampoo Apply 1 application topically 2 (two) times a week. 10/05/18  Yes Stallings, Zoe A, MD  naproxen (NAPROSYN) 500 MG tablet TAKE 1 TABLET(500 MG) BY MOUTH ONCE DAILY WITH A MEAL 10/05/18  Yes Stallings, Zoe A, MD  amLODipine (NORVASC) 10 MG tablet TAKE 1 TABLET(10 MG) BY MOUTH DAILY, NEED OFFICE VISIT Patient not taking: Reported on 07/05/2019 05/12/19   Forrest Moron, MD    Past Medical History:  Diagnosis Date  . Arthritis, gouty    as of 02-07-2016  . At risk for sleep apnea    STOP-BANG= 5      SENT TO PCP 02-07-2016  . Bilateral hydrocele   . Frequency of urination   . History of prostate cancer UROLOGIST--  DR Karsten Ro--  last PSA in 2014 (0.79)   dx 2004--  T1c, Gleason 3+3, PSA 10.5  --  completed IM radiation therapy 04/ 2006  .  Hypertension   . Right rotator cuff tear    per pt will be scheduling surgery this summer    Past Surgical History:  Procedure Laterality Date  . CIRCUMCISION N/A 07/05/2013   Procedure: CIRCUMCISION ADULT, aspiration of right hydrocele, meatal dilitation;  Surgeon: Claybon Jabs, MD;  Location: [facility];  Service: Urology;  Laterality: N/A;  . CYSTOSCOPY  02/12/2016   Procedure: CYSTOSCOPY;  Surgeon: Kathie Rhodes, MD;  Location: [facility];  Service: Urology;;  . Carollee Herter EXCISION Bilateral 02/12/2016   Procedure: HYDROCELECTOMY ADULT;  Surgeon: Kathie Rhodes, MD;  Location: [facility];  Service: Urology;  Laterality: Bilateral;    Social History   Tobacco Use  . Smoking status: Never Smoker  . Smokeless tobacco: Never Used  Substance Use Topics  . Alcohol use: Yes    Alcohol/week: 2.0 standard drinks    Types: 2  Standard drinks or equivalent per week    Family History  Problem Relation Age of Onset  . Colon cancer Neg Hx   . Pancreatic cancer Neg Hx   . Stomach cancer Neg Hx   . Esophageal cancer Neg Hx     Review of Systems  Constitutional: Negative for chills and fever.  Respiratory: Negative for cough and shortness of breath.   Cardiovascular: Negative for chest pain, palpitations and leg swelling.  Gastrointestinal: Negative for abdominal pain, nausea and vomiting.     OBJECTIVE:  Today's Vitals   07/05/19 1042 07/05/19 1048  BP: (!) 158/100 (!) 144/90  Pulse: 74   Temp: 98.5 F (36.9 C)   TempSrc: Oral   SpO2: 98%   Weight: 224 lb 6.4 oz (101.8 kg)   Height: 5\' 9"  (1.753 m)    Body mass index is 33.14 kg/m.  Wt Readings from Last 3 Encounters:  07/05/19 224 lb 6.4 oz (101.8 kg)  10/12/18 228 lb 9.6 oz (103.7 kg)  10/05/18 224 lb (101.6 kg)    Physical Exam Vitals signs and nursing note reviewed.  Constitutional:      Appearance: He is well-developed.  HENT:     Head: Normocephalic and atraumatic.  Eyes:     Conjunctiva/sclera: Conjunctivae normal.     Pupils: Pupils are equal, round, and reactive to light.  Neck:     Musculoskeletal: Neck supple.  Cardiovascular:     Rate and Rhythm: Normal rate and regular rhythm.     Heart sounds: No murmur. No friction rub. No gallop.   Pulmonary:     Effort: Pulmonary effort is normal.     Breath sounds: Normal breath sounds. No wheezing or rales.  Musculoskeletal:     Right lower leg: No edema.     Left lower leg: No edema.  Skin:    General: Skin is warm and dry.  Neurological:     Mental Status: He is alert and oriented to person, place, and time.     No results found for this or any previous visit (from the past 24 hour(s)).  No results found.   ASSESSMENT and PLAN  1. Essential hypertension Uncontrolled off meds. Restarting medications.  - Lipid panel - Comprehensive metabolic panel - amLODipine  (NORVASC) 10 MG tablet; Take 1 tablet (10 mg total) by mouth daily.  2. Prediabetes Cont with LFM, labs pending - Hemoglobin A1c - Lipid panel - Comprehensive metabolic panel  3. Chronic gout without tophus, unspecified cause, unspecified site Controlled. Cont current regime - Uric Acid - allopurinol (ZYLOPRIM) 300 MG  tablet; Take 1 tablet (300 mg total) by mouth daily.  4. History of prostate cancer Managed by urology  5. Screen for colon cancer - Ambulatory referral to Gastroenterology  6. History of colonic polyps - Ambulatory referral to Gastroenterology  7. Joint stiffness - naproxen (NAPROSYN) 500 MG tablet; TAKE 1 TABLET(500 MG) BY MOUTH ONCE DAILY WITH A MEAL  Return in about 6 months (around 01/02/2020).    Rutherford Guys, MD Primary Care at [place], [place] [address] Ph.  [phone] Fax [phone]

## 2019-07-05 NOTE — Patient Instructions (Signed)
° ° ° °  If you have lab work done today you will be contacted with your lab results within the next 2 weeks.  If you have not heard from us then please contact us. The fastest way to get your results is to register for My Chart. ° ° °IF you received an x-ray today, you will receive an invoice from [facility]. Please contact [facility] at [phone] with questions or concerns regarding your invoice.  ° °IF you received labwork today, you will receive an invoice from [facility]. Please contact [facility] at [phone] with questions or concerns regarding your invoice.  ° °Our billing staff will not be able to assist you with questions regarding bills from these companies. ° °You will be contacted with the lab results as soon as they are available. The fastest way to get your results is to activate your My Chart account. Instructions are located on the last page of this paperwork. If you have not heard from us regarding the results in 2 weeks, please contact this office. °  ° ° ° °

## 2019-07-06 LAB — COMPREHENSIVE METABOLIC PANEL
ALT: 64 IU/L — ABNORMAL HIGH (ref 0–44)
AST: 70 IU/L — ABNORMAL HIGH (ref 0–40)
Albumin/Globulin Ratio: 1.4 (ref 1.2–2.2)
Albumin: 4.6 g/dL (ref 3.8–4.8)
Alkaline Phosphatase: 68 IU/L (ref 39–117)
BUN/Creatinine Ratio: 14 (ref 10–24)
BUN: 17 mg/dL (ref 8–27)
Bilirubin Total: 0.4 mg/dL (ref 0.0–1.2)
CO2: 23 mmol/L (ref 20–29)
Calcium: 9.5 mg/dL (ref 8.6–10.2)
Chloride: 102 mmol/L (ref 96–106)
Creatinine, Ser: 1.21 mg/dL (ref 0.76–1.27)
GFR calc Af Amer: 70 mL/min/{1.73_m2} (ref >59)
GFR calc non Af Amer: 61 mL/min/{1.73_m2} (ref >59)
Globulin, Total: 3.2 g/dL (ref 1.5–4.5)
Glucose: 108 mg/dL — ABNORMAL HIGH (ref 65–99)
Potassium: 4.6 mmol/L (ref 3.5–5.2)
Sodium: 141 mmol/L (ref 134–144)
Total Protein: 7.8 g/dL (ref 6.0–8.5)

## 2019-07-06 LAB — LIPID PANEL
Chol/HDL Ratio: 5.2 ratio — ABNORMAL HIGH (ref 0.0–5.0)
Cholesterol, Total: 178 mg/dL (ref 100–199)
HDL: 34 mg/dL — ABNORMAL LOW (ref >39)
LDL Chol Calc (NIH): 108 mg/dL — ABNORMAL HIGH (ref 0–99)
Triglycerides: 208 mg/dL — ABNORMAL HIGH (ref 0–149)
VLDL Cholesterol Cal: 36 mg/dL (ref 5–40)

## 2019-07-06 LAB — URIC ACID: Uric Acid: 6.5 mg/dL (ref 3.7–8.6)

## 2019-07-06 LAB — HEMOGLOBIN A1C
Est. average glucose Bld gHb Est-mCnc: 134 mg/dL
Hgb A1c MFr Bld: 6.3 % — ABNORMAL HIGH (ref 4.8–5.6)

## 2019-07-27 ENCOUNTER — Encounter: Payer: Self-pay | Admitting: Internal Medicine

## 2019-07-27 ENCOUNTER — Other Ambulatory Visit: Payer: Self-pay

## 2019-07-27 ENCOUNTER — Ambulatory Visit (AMBULATORY_SURGERY_CENTER): Payer: Medicare HMO | Admitting: *Deleted

## 2019-07-27 VITALS — Temp 97.8°F | Ht 69.0 in | Wt 225.0 lb

## 2019-07-27 DIAGNOSIS — Z86010 Personal history of colonic polyps: Secondary | ICD-10-CM

## 2019-07-27 DIAGNOSIS — Z1159 Encounter for screening for other viral diseases: Secondary | ICD-10-CM

## 2019-07-27 MED ORDER — NA SULFATE-K SULFATE-MG SULF 17.5-3.13-1.6 GM/177ML PO SOLN: ORAL | 0 refills | Status: DC

## 2019-07-27 NOTE — Progress Notes (Signed)
Patient is here in-person for PV. Patient denies any allergies to eggs or soy. Patient denies any problems with anesthesia/sedation. Patient denies any oxygen use at home. Patient denies taking any diet/weight loss medications or blood thinners. Patient is not being treated for MRSA or C-diff. EMMI education assisgned to the patient for the procedure, this was explained and instructions given to patient. COVID-19 screening test is on 12/2 1150am, the pt is aware. Pt is aware that care partner will wait in the car during procedure; if they feel like they will be too hot or cold to wait in the car; they may wait in the 4 th floor lobby. Patient is aware to bring only one care partner. We want them to wear a mask (we do not have any that we can provide them), practice social distancing, and we will check their temperatures when they get here.  I did remind the patient that their care partner needs to stay in the parking lot the entire time and have a cell phone available, we will call them when the pt is ready for discharge. Patient will wear mask into building.

## 2019-07-28 ENCOUNTER — Ambulatory Visit (INDEPENDENT_AMBULATORY_CARE_PROVIDER_SITE_OTHER): Payer: Medicare HMO

## 2019-07-28 DIAGNOSIS — Z1159 Encounter for screening for other viral diseases: Secondary | ICD-10-CM

## 2019-07-29 LAB — SARS CORONAVIRUS 2 (TAT 6-24 HRS): SARS Coronavirus 2: NEGATIVE

## 2019-08-02 ENCOUNTER — Ambulatory Visit (AMBULATORY_SURGERY_CENTER): Payer: Medicare HMO | Admitting: Internal Medicine

## 2019-08-02 ENCOUNTER — Other Ambulatory Visit: Payer: Self-pay

## 2019-08-02 ENCOUNTER — Encounter: Payer: Medicare HMO | Admitting: Internal Medicine

## 2019-08-02 ENCOUNTER — Encounter: Payer: Self-pay | Admitting: Internal Medicine

## 2019-08-02 VITALS — BP 130/83 | HR 59 | Temp 98.6°F | Resp 11 | Ht 69.0 in | Wt 225.0 lb

## 2019-08-02 DIAGNOSIS — Z86010 Personal history of colonic polyps: Secondary | ICD-10-CM

## 2019-08-02 DIAGNOSIS — I10 Essential (primary) hypertension: Secondary | ICD-10-CM | POA: Diagnosis not present

## 2019-08-02 DIAGNOSIS — K635 Polyp of colon: Secondary | ICD-10-CM

## 2019-08-02 DIAGNOSIS — D122 Benign neoplasm of ascending colon: Secondary | ICD-10-CM

## 2019-08-02 MED ORDER — SODIUM CHLORIDE 0.9 % IV SOLN: 500.0000 mL | Freq: Once | INTRAVENOUS | Status: DC

## 2019-08-02 NOTE — Progress Notes (Signed)
Report to PACU, RN, vss, BBS= Clear.  

## 2019-08-02 NOTE — Patient Instructions (Signed)
Thank you for allowing [place] to care for you today!  Await pathology results of polyp removed, 7-10 days by mail.  Recommend next colonoscopy in 5 years for surveillance.  Resume previous diet and medications today. Return to your normal activities tomorrow.   YOU HAD AN ENDOSCOPIC PROCEDURE TODAY AT [facility]:   Refer to the procedure report that was given to you for any specific questions about what was found during the examination.  If the procedure report does not answer your questions, please call your gastroenterologist to clarify.  If you requested that your care partner not be given the details of your procedure findings, then the procedure report has been included in a sealed envelope for you to review at your convenience later.  YOU SHOULD EXPECT: Some feelings of bloating in the abdomen. Passage of more gas than usual.  Walking can help get rid of the air that was put into your GI tract during the procedure and reduce the bloating. If you had a lower endoscopy (such as a colonoscopy or flexible sigmoidoscopy) you may notice spotting of blood in your stool or on the toilet paper. If you underwent a bowel prep for your procedure, you may not have a normal bowel movement for a few days.  Please Note:  You might notice some irritation and congestion in your nose or some drainage.  This is from the oxygen used during your procedure.  There is no need for concern and it should clear up in a day or so.  SYMPTOMS TO REPORT IMMEDIATELY:   Following lower endoscopy (colonoscopy or flexible sigmoidoscopy):  Excessive amounts of blood in the stool  Significant tenderness or worsening of abdominal pains  Swelling of the abdomen that is new, acute  Fever of 100F or higher  For urgent or emergent issues, a gastroenterologist can be reached at any hour by calling [phone].   DIET:  We do recommend a small meal at first, but then you may proceed to your regular diet.  Drink  plenty of fluids but you should avoid alcoholic beverages for 24 hours.  ACTIVITY:  You should plan to take it easy for the rest of today and you should NOT DRIVE or use heavy machinery until tomorrow (because of the sedation medicines used during the test).    FOLLOW UP: Our staff will call the number listed on your records 48-72 hours following your procedure to check on you and address any questions or concerns that you may have regarding the information given to you following your procedure. If we do not reach you, we will leave a message.  We will attempt to reach you two times.  During this call, we will ask if you have developed any symptoms of COVID 19. If you develop any symptoms (ie: fever, flu-like symptoms, shortness of breath, cough etc.) before then, please call [phone].  If you test positive for Covid 19 in the 2 weeks post procedure, please call and report this information to [place].    If any biopsies were taken you will be contacted by phone or by letter within the next 1-3 weeks.  Please call us at [phone] if you have not heard about the biopsies in 3 weeks.    SIGNATURES/CONFIDENTIALITY: You and/or your care partner have signed paperwork which will be entered into your electronic medical record.  These signatures attest to the fact that that the information above on your After Visit Summary has been reviewed and is  understood.  Full responsibility of the confidentiality of this discharge information lies with you and/or your care-partner. 

## 2019-08-02 NOTE — Op Note (Signed)
Jerry Chapman: Jerry Chapman Procedure Date: 08/02/2019 11:02 AM MRN: [id number] Endoscopist: Docia Chuck. Henrene Pastor , MD Age: 68 Referring MD:  Date of Birth: 09-Feb-1951 Gender: Male Account #: [id number] Procedure:                Colonoscopy with cold snare polypectomy x 1 Indications:              High risk colon cancer surveillance: Personal                            history of multiple (3 or more) adenomas. Previous                            examination November 2014 (5 adenomatous polyps) Medicines:                Monitored Anesthesia Care Procedure:                Pre-Anesthesia Assessment:                           - Prior to the procedure, a History and Physical                            was performed, and patient medications and                            allergies were reviewed. The patient's tolerance of                            previous anesthesia was also reviewed. The risks                            and benefits of the procedure and the sedation                            options and risks were discussed with the patient.                            All questions were answered, and informed consent                            was obtained. Prior Anticoagulants: The patient has                            taken no previous anticoagulant or antiplatelet                            agents. ASA Grade Assessment: II - A patient with                            mild systemic disease. After reviewing the risks                            and benefits, the patient was deemed in  satisfactory condition to undergo the procedure.                           After obtaining informed consent, the colonoscope                            was passed under direct vision. Throughout the                            procedure, the patient's blood pressure, pulse, and                            oxygen saturations were monitored continuously. The                     Colonoscope was introduced through the anus and                            advanced to the the cecum, identified by                            appendiceal orifice and ileocecal valve. The                            ileocecal valve, appendiceal orifice, and rectum                            were photographed. The quality of the bowel                            preparation was good. The colonoscopy was performed                            without difficulty. The patient tolerated the                            procedure well. The bowel preparation used was                            SUPREP via split dose instruction. Scope In: 11:09:59 AM Scope Out: 11:23:29 AM Scope Withdrawal Time: 0 hours 11 minutes 28 seconds  Total Procedure Duration: 0 hours 13 minutes 30 seconds  Findings:                 A 3 mm polyp was found in the ascending colon. The                            polyp was removed with a cold snare. Resection and                            retrieval were complete.                           Multiple diverticula were found in the left colon.  Internal hemorrhoids were found during retroflexion.                           The exam was otherwise without abnormality on                            direct and retroflexion views. Complications:            No immediate complications. Estimated blood loss:                            None. Estimated Blood Loss:     Estimated blood loss: none. Impression:               - One 3 mm polyp in the ascending colon, removed                            with a cold snare. Resected and retrieved.                           - Diverticulosis in the left colon.                           - Internal hemorrhoids.                           - The examination was otherwise normal on direct                            and retroflexion views. Recommendation:           - Repeat colonoscopy in 5 years for surveillance.                            - Patient has a contact number available for                            emergencies. The signs and symptoms of potential                            delayed complications were discussed with the                            patient. Return to normal activities tomorrow.                            Written discharge instructions were provided to the                            patient.                           - Resume previous diet.                           - Continue present medications.                           -  Await pathology results. Docia Chuck. Henrene Pastor, MD 08/02/2019 11:28:36 AM This report has been signed electronically.

## 2019-08-02 NOTE — Progress Notes (Signed)
Pt's states no medical or surgical changes since previsit or office visit. 

## 2019-08-02 NOTE — Progress Notes (Signed)
Temperature taken by L.C., VS taken by C.B.

## 2019-08-04 ENCOUNTER — Telehealth: Payer: Self-pay

## 2019-08-04 ENCOUNTER — Telehealth: Payer: Self-pay | Admitting: *Deleted

## 2019-08-04 NOTE — Telephone Encounter (Signed)
Left message on f/u call 

## 2019-08-04 NOTE — Telephone Encounter (Signed)
Second post procedure follow up call, no answer 

## 2019-08-05 ENCOUNTER — Encounter: Payer: Self-pay | Admitting: Internal Medicine

## 2019-10-13 DIAGNOSIS — C61 Malignant neoplasm of prostate: Secondary | ICD-10-CM | POA: Diagnosis not present

## 2019-10-22 ENCOUNTER — Ambulatory Visit: Payer: Medicare HMO | Attending: Internal Medicine

## 2019-10-22 DIAGNOSIS — Z23 Encounter for immunization: Secondary | ICD-10-CM

## 2019-10-22 NOTE — Progress Notes (Signed)
   Covid-19 Vaccination Clinic  Name:  Jerry Chapman    MRN: [id number] DOB: 1951-01-23  10/22/2019  Mr. Jerry Chapman was observed post Covid-19 immunization for 15 minutes without incidence. He was provided with Vaccine Information Sheet and instruction to access the V-Safe system.   Mr. Jerry Chapman was instructed to call 911 with any severe reactions post vaccine: Marland Kitchen Difficulty breathing  . Swelling of your face and throat  . A fast heartbeat  . A bad rash all over your body  . Dizziness and weakness    Immunizations Administered    Name Date Dose VIS Date Route   Pfizer COVID-19 Vaccine 10/22/2019  8:29 AM 0.3 mL 08/06/2019 Intramuscular   Manufacturer: [place]   Lot: [id number]   [place]: [id number]

## 2019-10-25 ENCOUNTER — Telehealth: Payer: Self-pay | Admitting: Family Medicine

## 2019-10-25 NOTE — Telephone Encounter (Signed)
Pt stated the pharmacy told him they need authorization from PCP to fill colchicine (COLCRYS) 0.6 MG tablet  Stated pharmacy needs to speak with practice. Please advise.  [facility] [place], [address] Phone:  [phone]  Fax:  [phone]

## 2019-10-25 NOTE — Telephone Encounter (Signed)
Will route to office for final disposition.  Pt stated the pharmacy told him they need authorization from PCP to fill colchicine (COLCRYS) 0.6 MG tablet  Stated pharmacy needs to speak with practice. Please advise.  [place], [place] - [address] AT [address]   Requested medication (s) are due for refill today: yes  Requested medication (s) are on the active medication list: yes  Last refill: 07/05/2019  Future visit scheduled: yes  Notes to cl/inic: need authorization for med

## 2019-10-26 NOTE — Telephone Encounter (Signed)
Pt calling to check status. Pt is out of medication.

## 2019-10-28 ENCOUNTER — Telehealth: Payer: Self-pay | Admitting: Family Medicine

## 2019-10-28 NOTE — Telephone Encounter (Signed)
What is the name of the medication? colchicine (COLCRYS) 0.6 MG tablet  Have you contacted your pharmacy to request a refill? Yes   Which pharmacy would you like this sent to?   [place], [address] [place] [address]   Patient notified that their request is being sent to the clinical staff for review and that they should receive a call once it is complete. If they do not receive a call within 72 hours they can check with their pharmacy or our office.    Pt is completely out of medication  Telephone encounter 10/25/2019 he requested the med refill originally

## 2019-10-29 ENCOUNTER — Other Ambulatory Visit: Payer: Self-pay

## 2019-10-29 DIAGNOSIS — M1A9XX0 Chronic gout, unspecified, without tophus (tophi): Secondary | ICD-10-CM

## 2019-10-29 MED ORDER — COLCHICINE 0.6 MG PO TABS: 0.6000 mg | ORAL_TABLET | Freq: Every day | ORAL | 1 refills | Status: DC

## 2019-10-29 NOTE — Telephone Encounter (Signed)
Colchicine 0.6 mg #90 with 1 refill sent to [facility] [address].  Pt notified and agreeable.

## 2019-11-03 ENCOUNTER — Ambulatory Visit: Payer: Medicare HMO

## 2019-11-11 ENCOUNTER — Telehealth: Payer: Self-pay | Admitting: *Deleted

## 2019-11-11 NOTE — Telephone Encounter (Signed)
KEY  [id number]  COLCRYS 0.6

## 2019-11-12 ENCOUNTER — Telehealth: Payer: Self-pay | Admitting: Family Medicine

## 2019-11-12 NOTE — Telephone Encounter (Signed)
[employer] called this morning stating they are needing a PA for pts colchicine. Please advise.   Reference#: [id number]  Callback #: [id number]

## 2019-11-12 NOTE — Telephone Encounter (Signed)
PA needed for this pt

## 2019-11-15 ENCOUNTER — Other Ambulatory Visit: Payer: Self-pay | Admitting: Family Medicine

## 2019-11-15 DIAGNOSIS — M1A9XX0 Chronic gout, unspecified, without tophus (tophi): Secondary | ICD-10-CM

## 2019-11-15 MED ORDER — COLCHICINE 0.6 MG PO CAPS: 1.0000 | ORAL_CAPSULE | Freq: Every day | ORAL | 3 refills | Status: AC

## 2019-11-15 NOTE — Telephone Encounter (Signed)
Pa has been done and denied alternate medication was given to [facility] to see if he can take that.

## 2019-11-16 ENCOUNTER — Ambulatory Visit: Payer: Medicare HMO | Attending: Internal Medicine

## 2019-11-16 DIAGNOSIS — Z23 Encounter for immunization: Secondary | ICD-10-CM

## 2019-11-16 NOTE — Progress Notes (Signed)
   Covid-19 Vaccination Clinic  Name:  Jerry Chapman    MRN: [id number] DOB: 1951-05-28  11/16/2019  Jerry Chapman was observed post Covid-19 immunization for 15 minutes without incident. He was provided with Vaccine Information Sheet and instruction to access the V-Safe system.   Jerry Chapman was instructed to call 911 with any severe reactions post vaccine: Marland Kitchen Difficulty breathing  . Swelling of face and throat  . A fast heartbeat  . A bad rash all over body  . Dizziness and weakness   Immunizations Administered    Name Date Dose VIS Date Route   Pfizer COVID-19 Vaccine 11/16/2019 10:28 AM 0.3 mL 08/06/2019 Intramuscular   Manufacturer: [place]   Lot: [id number] [id number]   [place]: [id number]

## 2019-12-01 ENCOUNTER — Telehealth: Payer: Self-pay | Admitting: *Deleted

## 2019-12-01 NOTE — Telephone Encounter (Signed)
Schedule AWV.  

## 2019-12-08 ENCOUNTER — Ambulatory Visit (INDEPENDENT_AMBULATORY_CARE_PROVIDER_SITE_OTHER): Payer: Medicare HMO

## 2019-12-08 ENCOUNTER — Encounter: Payer: Self-pay | Admitting: Family Medicine

## 2019-12-08 ENCOUNTER — Ambulatory Visit (INDEPENDENT_AMBULATORY_CARE_PROVIDER_SITE_OTHER): Payer: Medicare HMO | Admitting: Family Medicine

## 2019-12-08 ENCOUNTER — Other Ambulatory Visit: Payer: Self-pay

## 2019-12-08 VITALS — BP 140/82 | HR 76 | Temp 98.0°F | Resp 17 | Ht 69.0 in | Wt 223.4 lb

## 2019-12-08 DIAGNOSIS — I10 Essential (primary) hypertension: Secondary | ICD-10-CM | POA: Diagnosis not present

## 2019-12-08 DIAGNOSIS — M25561 Pain in right knee: Secondary | ICD-10-CM

## 2019-12-08 DIAGNOSIS — R7303 Prediabetes: Secondary | ICD-10-CM | POA: Diagnosis not present

## 2019-12-08 MED ORDER — LIDOCAINE HCL (PF) 1 % IJ SOLN
2.0000 mL | Freq: Once | INTRAMUSCULAR | Status: AC
  Administered 2019-12-08: 15:00:00 2 mL via INTRADERMAL

## 2019-12-08 MED ORDER — TRIAMCINOLONE ACETONIDE 40 MG/ML IJ SUSP
40.0000 mg | Freq: Once | INTRAMUSCULAR | Status: AC
  Administered 2019-12-08: 15:00:00 40 mg via INTRA_ARTICULAR

## 2019-12-08 NOTE — Progress Notes (Signed)
Established Patient Office Visit  Subjective:  Patient ID: Jerry Chapman, male    DOB: [birth year]  Age: 69 y.o. MRN: [id number]  CC:  Chief Complaint  Patient presents with  . right knee pain    x 1 week plus, taking naproxen but with no relief.      HPI Jerry Chapman presents for   Patient reports that he has pain that comes and goes like a tooth ache and makes him come to tears.  He states that his pain is 7/10 when he is resting.  He states that with driving in the car with his foot on the gas pedal at9-10/10 He states that the pain is unbearable.   He has a history of gout but it has been controlled with allopurinol.   Hypertension: Patient here for follow-up of elevated blood pressure. He is exercising and is adherent to low salt diet.  Blood pressure is well controlled at home. Cardiac symptoms none. Patient denies chest pain, claudication, exertional chest pressure/discomfort and irregular heart beat.  Cardiovascular risk factors: hypertension. Use of agents associated with hypertension: none. History of target organ damage: none. BP Readings from Last 3 Encounters:  12/08/19 140/82  08/02/19 130/83  07/05/19 (!) 144/90    Prediabetes Lab Results  Component Value Date   HGBA1C 6.1 (H) 12/08/2019   Wt Readings from Last 3 Encounters:  12/08/19 223 lb 6.4 oz (101.3 kg)  08/02/19 225 lb (102.1 kg)  07/27/19 225 lb (102.1 kg)   Pt discussed that    Past Medical History:  Diagnosis Date  . Arthritis, gouty    as of 02-07-2016  . At risk for sleep apnea    STOP-BANG= 5      SENT TO PCP 02-07-2016  . Bilateral hydrocele   . Cancer ([place]) over 10 years ago    prostate cancer   . Frequency of urination   . History of prostate cancer UROLOGIST--  DR Karsten Ro--  last PSA in 2014 (0.79)   dx 2004--  T1c, Gleason 3+3, PSA 10.5  --  completed IM radiation therapy 04/ 2006  . Hypertension   . Right rotator cuff tear    per pt will be scheduling surgery this  summer    Past Surgical History:  Procedure Laterality Date  . CIRCUMCISION N/A 07/05/2013   Procedure: CIRCUMCISION ADULT, aspiration of right hydrocele, meatal dilitation;  Surgeon: Claybon Jabs, MD;  Location: [facility];  Service: Urology;  Laterality: N/A;  . COLONOSCOPY  last 07/19/2013  . CYSTOSCOPY  02/12/2016   Procedure: CYSTOSCOPY;  Surgeon: Kathie Rhodes, MD;  Location: [facility];  Service: Urology;;  . Carollee Herter EXCISION Bilateral 02/12/2016   Procedure: HYDROCELECTOMY ADULT;  Surgeon: Kathie Rhodes, MD;  Location: [facility];  Service: Urology;  Laterality: Bilateral;  . POLYPECTOMY      Family History  Problem Relation Age of Onset  . Colon cancer Neg Hx   . Pancreatic cancer Neg Hx   . Stomach cancer Neg Hx   . Esophageal cancer Neg Hx   . Colon polyps Neg Hx   . Rectal cancer Neg Hx     Social History   Socioeconomic History  . Marital status: Divorced    Spouse name: Not on file  . Number of children: Not on file  . Years of education: Not on file  . Highest education level: Not on file  Occupational History  . Occupation: Chief Strategy Officer  Tobacco Use  .  Smoking status: Never Smoker  . Smokeless tobacco: Never Used  Substance and Sexual Activity  . Alcohol use: Yes    Alcohol/week: 2.0 standard drinks    Types: 2 Standard drinks or equivalent per week  . Drug use: No  . Sexual activity: Not on file  Other Topics Concern  . 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    Lives at home byself   Social Determinants of Health   Financial Resource Strain:   . Difficulty of Paying Living Expenses:   Food Insecurity:   . Worried About Charity fundraiser in the Last Year:   . Arboriculturist in the Last Year:   Transportation Needs:   . Film/video editor (Medical):   Marland Kitchen Lack of Transportation (Non-Medical):   Physical Activity:   . Days of Exercise per Week:    . Minutes of Exercise per Session:   Stress:   . Feeling of Stress :   Social Connections:   . Frequency of Communication with Friends and Family:   . Frequency of Social Gatherings with Friends and Family:   . Attends Religious Services:   . Active Member of Clubs or Organizations:   . Attends Archivist Meetings:   Marland Kitchen Marital Status:   Intimate Partner Violence:   . Fear of Current or Ex-Partner:   . Emotionally Abused:   Marland Kitchen Physically Abused:   . Sexually Abused:     Outpatient Medications Prior to Visit  Medication Sig Dispense Refill  . allopurinol (ZYLOPRIM) 300 MG tablet Take 1 tablet (300 mg total) by mouth daily. 90 tablet 1  . amLODipine (NORVASC) 10 MG tablet Take 1 tablet (10 mg total) by mouth daily. 90 tablet 1  . Colchicine (MITIGARE) 0.6 MG CAPS Take 1 capsule by mouth daily. Dispense as Mitigare. 90 capsule 3  . ketoconazole (NIZORAL) 2 % shampoo Apply 1 application topically 2 (two) times a week. 360 mL 1  . naproxen (NAPROSYN) 500 MG tablet TAKE 1 TABLET(500 MG) BY MOUTH ONCE DAILY WITH A MEAL 90 tablet 1   No facility-administered medications prior to visit.    No Known Allergies  ROS Review of Systems    Objective:    Physical Exam  BP 140/82 (BP Location: Left Arm, Patient Position: Sitting, Cuff Size: Large)   Pulse 76   Temp 98 F (36.7 C) (Temporal)   Resp 17   Ht 5\' 9"  (1.753 m)   Wt 223 lb 6.4 oz (101.3 kg)   SpO2 96%   BMI 32.99 kg/m  Wt Readings from Last 3 Encounters:  12/08/19 223 lb 6.4 oz (101.3 kg)  08/02/19 225 lb (102.1 kg)  07/27/19 225 lb (102.1 kg)   Physical Exam  Constitutional: Oriented to person, place, and time. Appears well-developed and well-nourished.  HENT:  Head: Normocephalic and atraumatic.  Eyes: Conjunctivae and EOM are normal.  Cardiovascular: Normal rate, regular rhythm, normal heart sounds and intact distal pulses.  No murmur heard. Pulmonary/Chest: Effort normal and breath sounds normal. No  stridor. No respiratory distress. Has no wheezes.  Neurological: Is alert and oriented to person, place, and time.  Skin: Skin is warm. Capillary refill takes less than 2 seconds.  Psychiatric: Has a normal mood and affect. Behavior is normal. Judgment and thought content normal.   Knee exam - right knee with tenderness along the lateral joint line, reduced range of motion, no edema, erythema,  patellar apprehension Left knee with mild crepitus, FROM   CLINICAL DATA:  Right knee pain, no known injury, initial encounter  EXAM: RIGHT KNEE - COMPLETE 4+ VIEW  COMPARISON:  None.  FINDINGS: Tricompartmental degenerative changes are noted. No acute fracture or dislocation is seen. No joint effusion is noted. The patella is mildly high-riding.  IMPRESSION: Degenerative change without acute abnormality.   Electronically Signed   By: Inez Catalina M.D.   On: 12/08/2019 15:08    Health Maintenance Due  Topic Date Due  . PNA vac Low Risk Adult (2 of 2 - PPSV23) 10/06/2019    There are no preventive care reminders to display for this patient.  Lab Results  Component Value Date   TSH 1.247 07/16/2015   Lab Results  Component Value Date   WBC 8.5 04/09/2018   HGB 16.0 04/09/2018   HCT 48.3 04/09/2018   MCV 73 (L) 04/09/2018   PLT 254 04/09/2018   Lab Results  Component Value Date   NA 140 12/08/2019   K 4.4 12/08/2019   CO2 23 12/08/2019   GLUCOSE 93 12/08/2019   BUN 18 12/08/2019   CREATININE 1.10 12/08/2019   BILITOT 0.4 12/08/2019   ALKPHOS 73 12/08/2019   AST 54 (H) 12/08/2019   ALT 41 12/08/2019   PROT 7.8 12/08/2019   ALBUMIN 4.7 12/08/2019   CALCIUM 9.7 12/08/2019   Lab Results  Component Value Date   CHOL 167 12/08/2019   Lab Results  Component Value Date   HDL 28 (L) 12/08/2019   Lab Results  Component Value Date   LDLCALC 88 12/08/2019   Lab Results  Component Value Date   TRIG 310 (H) 12/08/2019   Lab Results  Component Value Date    CHOLHDL 6.0 (H) 12/08/2019   Lab Results  Component Value Date   HGBA1C 6.1 (H) 12/08/2019      Assessment & Plan:   Problem List Items Addressed This Visit      Cardiovascular and Mediastinum   HTN (hypertension)  - Patient's blood pressure is at goal of 139/89 or less. Condition is stable. Continue current medications and treatment plan. I recommend that you exercise for 30-45 minutes 5 days a week. I also recommend a balanced diet with fruits and vegetables every day, lean meats, and little fried foods. The DASH diet (you can find this online) is a good example of this.    Relevant Orders   Comprehensive metabolic panel (Completed)   Lipid panel (Completed)    Other Visit Diagnoses    Prediabetes    -  Primary Continue to be prediabetic, continue exercise and healthy diet   Relevant Orders   Hemoglobin A1c (Completed)   Acute pain of right knee    -  Discussed intra-articular joint injection, less suspicious for gout Likely his chronic OA    Relevant Medications   triamcinolone acetonide (KENALOG-40) injection 40 mg (Completed)   lidocaine (PF) (XYLOCAINE) 1 % injection 2 mL (Completed)   Other Relevant Orders   DG Knee Complete 4 Views Right (Completed)   Uric Acid (Completed)      Meds ordered this encounter  Medications  . triamcinolone acetonide (KENALOG-40) injection 40 mg  . lidocaine (PF) (XYLOCAINE) 1 % injection 2 mL    Follow-up: Return in about 6 months (around 06/08/2020) for hypertension .    Forrest Moron, MD

## 2019-12-08 NOTE — Patient Instructions (Addendum)
If you have lab work done today you will be contacted with your lab results within the next 2 weeks.  If you have not heard from [place] then please contact us. The fastest way to get your results is to register for My Chart.   IF you received an x-ray today, you will receive an invoice from [facility]. Please contact [facility] at [phone] with questions or concerns regarding your invoice.   IF you received labwork today, you will receive an invoice from [facility]. Please contact [facility] at [phone] with questions or concerns regarding your invoice.   Our billing staff will not be able to assist you with questions regarding bills from these companies.  You will be contacted with the lab results as soon as they are available. The fastest way to get your results is to activate your My Chart account. Instructions are located on the last page of this paperwork. If you have not heard from [place] regarding the results in 2 weeks, please contact this office.     Knee Injection A knee injection is a procedure to get medicine into your knee joint to relieve the pain, swelling, and stiffness of arthritis. Your health care provider uses a needle to inject medicine, which may also help to lubricate and cushion your knee joint. You may need more than one injection. Tell a health care provider about:  Any allergies you have.  All medicines you are taking, including vitamins, herbs, eye drops, creams, and over-the-counter medicines.  Any problems you or family members have had with anesthetic medicines.  Any blood disorders you have.  Any surgeries you have had.  Any medical conditions you have.  Whether you are pregnant or may be pregnant. What are the risks? Generally, this is a safe procedure. However, problems may occur, including:  Infection.  Bleeding.  Symptoms that get worse.  Damage to the area around your knee.  Allergic reaction to any of the  medicines.  Skin reactions from repeated injections. What happens before the procedure?  Ask your health care provider about changing or stopping your regular medicines. This is especially important if you are taking diabetes medicines or blood thinners.  Plan to have someone take you home from the hospital or clinic. What happens during the procedure?   You will sit or lie down in a position for your knee to be treated.  The skin over your kneecap will be cleaned with a germ-killing soap.  You will be given a medicine that numbs the area (local anesthetic). You may feel some stinging.  The medicine will be injected into your knee. The needle is carefully placed between your kneecap and your knee. The medicine is injected into the joint space.  The needle will be removed at the end of the procedure.  A bandage (dressing) may be placed over the injection site. The procedure may vary among health care providers and hospitals. What can I expect after the procedure?  Your blood pressure, heart rate, breathing rate, and blood oxygen level will be monitored until you leave the hospital or clinic.  You may have to move your knee through its full range of motion. This helps to get all the medicine into your joint space.  You will be watched to make sure that you do not have a reaction to the injected medicine.  You may feel more pain, swelling, and warmth than you did before the injection. This reaction may last about 1-2 days. Follow  these instructions at home: Medicines  Take over-the-counter and prescription medicines only as told by your doctor.  Do not drive or use heavy machinery while taking prescription pain medicine.  Do not take medicines such as aspirin and ibuprofen unless your health care provider tells you to take them. Injection site care  Follow instructions from your health care provider about: ? How to take care of your puncture site. ? When and how you should  change your dressing. ? When you should remove your dressing.  Check your injection area every day for signs of infection. Check for: ? More redness, swelling, or pain after 2 days. ? Fluid or blood. ? Pus or a bad smell. ? Warmth. Managing pain, stiffness, and swelling   If directed, put ice on the injection area: ? Put ice in a plastic bag. ? Place a towel between your skin and the bag. ? Leave the ice on for 20 minutes, 2-3 times per day.  Do not apply heat to your knee.  Raise (elevate) the injection area above the level of your heart while you are sitting or lying down. General instructions  If you were given a dressing, keep it dry until your health care provider says it can be removed. Ask your health care provider when you can start showering or taking a bath.  Avoid strenuous activities for as long as directed by your health care provider. Ask your health care provider when you can return to your normal activities.  Keep all follow-up visits as told by your health care provider. This is important. You may need more injections. Contact a health care provider if you have:  A fever.  Warmth in your injection area.  Fluid, blood, or pus coming from your injection site.  Symptoms at your injection site that last longer than 2 days after your procedure. Get help right away if:  Your knee: ? Turns very red. ? Becomes very swollen. ? Is in severe pain. Summary  A knee injection is a procedure to get medicine into your knee joint to relieve the pain, swelling, and stiffness of arthritis.  A needle is carefully placed between your kneecap and your knee to inject medicine into the joint space.  Before the procedure, ask your health care provider about changing or stopping your regular medicines, especially if you are taking diabetes medicines or blood thinners.  Contact your health care provider if you have any problems or questions after your procedure. This  information is not intended to replace advice given to you by your health care provider. Make sure you discuss any questions you have with your health care provider. Document Revised: 09/01/2017 Document Reviewed: 09/01/2017 Elsevier Patient Education  [place].

## 2019-12-09 LAB — COMPREHENSIVE METABOLIC PANEL
ALT: 41 IU/L (ref 0–44)
AST: 54 IU/L — ABNORMAL HIGH (ref 0–40)
Albumin/Globulin Ratio: 1.5 (ref 1.2–2.2)
Albumin: 4.7 g/dL (ref 3.8–4.8)
Alkaline Phosphatase: 73 IU/L (ref 39–117)
BUN/Creatinine Ratio: 16 (ref 10–24)
BUN: 18 mg/dL (ref 8–27)
Bilirubin Total: 0.4 mg/dL (ref 0.0–1.2)
CO2: 23 mmol/L (ref 20–29)
Calcium: 9.7 mg/dL (ref 8.6–10.2)
Chloride: 100 mmol/L (ref 96–106)
Creatinine, Ser: 1.1 mg/dL (ref 0.76–1.27)
GFR calc Af Amer: 79 mL/min/{1.73_m2} (ref >59)
GFR calc non Af Amer: 68 mL/min/{1.73_m2} (ref >59)
Globulin, Total: 3.1 g/dL (ref 1.5–4.5)
Glucose: 93 mg/dL (ref 65–99)
Potassium: 4.4 mmol/L (ref 3.5–5.2)
Sodium: 140 mmol/L (ref 134–144)
Total Protein: 7.8 g/dL (ref 6.0–8.5)

## 2019-12-09 LAB — LIPID PANEL
Chol/HDL Ratio: 6 ratio — ABNORMAL HIGH (ref 0.0–5.0)
Cholesterol, Total: 167 mg/dL (ref 100–199)
HDL: 28 mg/dL — ABNORMAL LOW (ref >39)
LDL Chol Calc (NIH): 88 mg/dL (ref 0–99)
Triglycerides: 310 mg/dL — ABNORMAL HIGH (ref 0–149)
VLDL Cholesterol Cal: 51 mg/dL — ABNORMAL HIGH (ref 5–40)

## 2019-12-09 LAB — URIC ACID: Uric Acid: 5.8 mg/dL (ref 3.8–8.4)

## 2019-12-09 LAB — HEMOGLOBIN A1C
Est. average glucose Bld gHb Est-mCnc: 128 mg/dL
Hgb A1c MFr Bld: 6.1 % — ABNORMAL HIGH (ref 4.8–5.6)

## 2019-12-10 NOTE — Progress Notes (Signed)
Procedure Note  Time out taken  Following information identified:  Patient: Jerry Chapman, yes  Procedure: intraarticular steroid injection of right knee  Site (location and laterality): right After consent was obtained (Signed witnessed form to be scanned to encounter), using aseptic technique the knee was prepped.The knee joint was entered and Kenalog 40mg  and 2 mL xylocaine was then injected and the needle withdrawn. The procedure was well tolerated. The patient is asked to continue to rest the knee for a few more days before resuming regular activities. It may be more painful for the first 1-2 days. Watch for fever, or increased swelling or persistent pain in knee. Call or return to clinic prn if such symptoms occur or the knee fails to improve as anticipated.

## 2019-12-30 ENCOUNTER — Other Ambulatory Visit: Payer: Self-pay | Admitting: Family Medicine

## 2019-12-30 DIAGNOSIS — I10 Essential (primary) hypertension: Secondary | ICD-10-CM

## 2020-01-03 ENCOUNTER — Ambulatory Visit: Payer: Medicare HMO | Admitting: Family Medicine

## 2020-01-18 DIAGNOSIS — I10 Essential (primary) hypertension: Secondary | ICD-10-CM | POA: Diagnosis not present

## 2020-01-18 DIAGNOSIS — R252 Cramp and spasm: Secondary | ICD-10-CM | POA: Diagnosis not present

## 2020-01-20 DIAGNOSIS — R252 Cramp and spasm: Secondary | ICD-10-CM | POA: Diagnosis not present

## 2020-02-09 ENCOUNTER — Other Ambulatory Visit: Payer: Self-pay | Admitting: Family Medicine

## 2020-02-09 DIAGNOSIS — M2560 Stiffness of unspecified joint, not elsewhere classified: Secondary | ICD-10-CM

## 2020-02-09 NOTE — Telephone Encounter (Signed)
Requested medication (s) are due for refill today:yes  Requested medication (s) are on the active medication list: yes  Last refill:  07/05/19  Future visit scheduled:No  Notes to clinic: HGB last check 04/09/18    Requested Prescriptions  Pending Prescriptions Disp Refills   naproxen (NAPROSYN) 500 MG tablet [Pharmacy Med Name: NAPROXEN 500MG  TABLETS] 90 tablet 1    Sig: TAKE 1 TABLET(500 MG) BY MOUTH EVERY DAY WITH A MEAL      Analgesics:  NSAIDS Failed - 02/09/2020  5:01 PM      Failed - HGB in normal range and within 360 days    Hemoglobin  Date Value Ref Range Status  04/09/2018 16.0 13.0 - 17.7 g/dL Final          Passed - Cr in normal range and within 360 days    Creat  Date Value Ref Range Status  07/16/2015 1.31 (H) 0.70 - 1.25 mg/dL Final   Creatinine, Ser  Date Value Ref Range Status  12/08/2019 1.10 0.76 - 1.27 mg/dL Final          Passed - Patient is not pregnant      Passed - Valid encounter within last 12 months    Recent Outpatient Visits           2 months ago Prediabetes   Primary Care at [facility], Arlie Solomons, MD   7 months ago Essential hypertension   [facility] at [facility], Lilia Argue, MD   1 year ago Medicare annual wellness visit, subsequent   Primary Care at [facility], Arlie Solomons, MD   1 year ago Essential hypertension   Primary Care at [facility], Arlie Solomons, MD   1 year ago Essential hypertension   Primary Care at [facility], Audrie Lia, PA-C

## 2020-02-29 DIAGNOSIS — M7502 Adhesive capsulitis of left shoulder: Secondary | ICD-10-CM | POA: Diagnosis not present

## 2020-03-10 DIAGNOSIS — M6281 Muscle weakness (generalized): Secondary | ICD-10-CM | POA: Diagnosis not present

## 2020-03-10 DIAGNOSIS — M25512 Pain in left shoulder: Secondary | ICD-10-CM | POA: Diagnosis not present

## 2020-03-10 DIAGNOSIS — M79602 Pain in left arm: Secondary | ICD-10-CM | POA: Diagnosis not present

## 2020-03-15 DIAGNOSIS — M6281 Muscle weakness (generalized): Secondary | ICD-10-CM | POA: Diagnosis not present

## 2020-03-15 DIAGNOSIS — M25512 Pain in left shoulder: Secondary | ICD-10-CM | POA: Diagnosis not present

## 2020-03-15 DIAGNOSIS — M79602 Pain in left arm: Secondary | ICD-10-CM | POA: Diagnosis not present

## 2020-03-17 DIAGNOSIS — M79602 Pain in left arm: Secondary | ICD-10-CM | POA: Diagnosis not present

## 2020-03-17 DIAGNOSIS — M6281 Muscle weakness (generalized): Secondary | ICD-10-CM | POA: Diagnosis not present

## 2020-03-17 DIAGNOSIS — M25512 Pain in left shoulder: Secondary | ICD-10-CM | POA: Diagnosis not present

## 2020-03-18 ENCOUNTER — Other Ambulatory Visit: Payer: Self-pay

## 2020-03-18 ENCOUNTER — Inpatient Hospital Stay (HOSPITAL_COMMUNITY)
Admission: EM | Admit: 2020-03-18 | Discharge: 2020-03-30 | DRG: 329 | Disposition: A | Discharge status: Home / Self Care | Payer: Medicare HMO | Attending: Internal Medicine | Admitting: Internal Medicine

## 2020-03-18 ENCOUNTER — Encounter (HOSPITAL_COMMUNITY): Payer: Self-pay | Admitting: *Deleted

## 2020-03-18 ENCOUNTER — Encounter (HOSPITAL_COMMUNITY): Payer: Self-pay | Admitting: Emergency Medicine

## 2020-03-18 ENCOUNTER — Ambulatory Visit (HOSPITAL_COMMUNITY)
Admission: EM | Admit: 2020-03-18 | Discharge: 2020-03-18 | Disposition: A | Discharge status: Home / Self Care | Payer: Medicare HMO | Source: Home / Self Care

## 2020-03-18 ENCOUNTER — Ambulatory Visit (INDEPENDENT_AMBULATORY_CARE_PROVIDER_SITE_OTHER): Payer: Medicare HMO

## 2020-03-18 DIAGNOSIS — R1012 Left upper quadrant pain: Secondary | ICD-10-CM | POA: Diagnosis not present

## 2020-03-18 DIAGNOSIS — K5669 Other partial intestinal obstruction: Secondary | ICD-10-CM | POA: Diagnosis present

## 2020-03-18 DIAGNOSIS — K529 Noninfective gastroenteritis and colitis, unspecified: Secondary | ICD-10-CM | POA: Diagnosis not present

## 2020-03-18 DIAGNOSIS — K6389 Other specified diseases of intestine: Secondary | ICD-10-CM | POA: Diagnosis not present

## 2020-03-18 DIAGNOSIS — K55029 Acute infarction of small intestine, extent unspecified: Secondary | ICD-10-CM | POA: Diagnosis present

## 2020-03-18 DIAGNOSIS — R1030 Lower abdominal pain, unspecified: Secondary | ICD-10-CM | POA: Diagnosis not present

## 2020-03-18 DIAGNOSIS — K56600 Partial intestinal obstruction, unspecified as to cause: Secondary | ICD-10-CM | POA: Diagnosis not present

## 2020-03-18 DIAGNOSIS — K55069 Acute infarction of intestine, part and extent unspecified: Secondary | ICD-10-CM | POA: Diagnosis not present

## 2020-03-18 DIAGNOSIS — K651 Peritoneal abscess: Secondary | ICD-10-CM | POA: Diagnosis present

## 2020-03-18 DIAGNOSIS — Z9989 Dependence on other enabling machines and devices: Secondary | ICD-10-CM | POA: Diagnosis not present

## 2020-03-18 DIAGNOSIS — R1909 Other intra-abdominal and pelvic swelling, mass and lump: Secondary | ICD-10-CM | POA: Diagnosis not present

## 2020-03-18 DIAGNOSIS — Z79899 Other long term (current) drug therapy: Secondary | ICD-10-CM

## 2020-03-18 DIAGNOSIS — R05 Cough: Secondary | ICD-10-CM | POA: Diagnosis not present

## 2020-03-18 DIAGNOSIS — R109 Unspecified abdominal pain: Secondary | ICD-10-CM | POA: Diagnosis not present

## 2020-03-18 DIAGNOSIS — K630 Abscess of intestine: Principal | ICD-10-CM | POA: Diagnosis present

## 2020-03-18 DIAGNOSIS — Z791 Long term (current) use of non-steroidal anti-inflammatories (NSAID): Secondary | ICD-10-CM | POA: Diagnosis not present

## 2020-03-18 DIAGNOSIS — R1032 Left lower quadrant pain: Secondary | ICD-10-CM

## 2020-03-18 DIAGNOSIS — N281 Cyst of kidney, acquired: Secondary | ICD-10-CM | POA: Diagnosis not present

## 2020-03-18 DIAGNOSIS — K5939 Other megacolon: Secondary | ICD-10-CM | POA: Diagnosis not present

## 2020-03-18 DIAGNOSIS — M109 Gout, unspecified: Secondary | ICD-10-CM | POA: Diagnosis present

## 2020-03-18 DIAGNOSIS — I10 Essential (primary) hypertension: Secondary | ICD-10-CM | POA: Diagnosis present

## 2020-03-18 DIAGNOSIS — K650 Generalized (acute) peritonitis: Secondary | ICD-10-CM | POA: Diagnosis not present

## 2020-03-18 DIAGNOSIS — G4733 Obstructive sleep apnea (adult) (pediatric): Secondary | ICD-10-CM | POA: Diagnosis present

## 2020-03-18 DIAGNOSIS — Z6833 Body mass index (BMI) 33.0-33.9, adult: Secondary | ICD-10-CM | POA: Diagnosis not present

## 2020-03-18 DIAGNOSIS — K56609 Unspecified intestinal obstruction, unspecified as to partial versus complete obstruction: Secondary | ICD-10-CM | POA: Diagnosis present

## 2020-03-18 DIAGNOSIS — M1990 Unspecified osteoarthritis, unspecified site: Secondary | ICD-10-CM | POA: Diagnosis present

## 2020-03-18 DIAGNOSIS — R61 Generalized hyperhidrosis: Secondary | ICD-10-CM | POA: Diagnosis present

## 2020-03-18 DIAGNOSIS — E860 Dehydration: Secondary | ICD-10-CM | POA: Diagnosis present

## 2020-03-18 DIAGNOSIS — N179 Acute kidney failure, unspecified: Secondary | ICD-10-CM | POA: Diagnosis present

## 2020-03-18 DIAGNOSIS — C61 Malignant neoplasm of prostate: Secondary | ICD-10-CM | POA: Diagnosis not present

## 2020-03-18 DIAGNOSIS — Z20822 Contact with and (suspected) exposure to covid-19: Secondary | ICD-10-CM | POA: Diagnosis present

## 2020-03-18 DIAGNOSIS — Z923 Personal history of irradiation: Secondary | ICD-10-CM | POA: Diagnosis not present

## 2020-03-18 DIAGNOSIS — R7989 Other specified abnormal findings of blood chemistry: Secondary | ICD-10-CM | POA: Diagnosis not present

## 2020-03-18 DIAGNOSIS — R140 Abdominal distension (gaseous): Secondary | ICD-10-CM

## 2020-03-18 DIAGNOSIS — Z4682 Encounter for fitting and adjustment of non-vascular catheter: Secondary | ICD-10-CM | POA: Diagnosis not present

## 2020-03-18 DIAGNOSIS — R509 Fever, unspecified: Secondary | ICD-10-CM

## 2020-03-18 DIAGNOSIS — D72829 Elevated white blood cell count, unspecified: Secondary | ICD-10-CM | POA: Diagnosis not present

## 2020-03-18 DIAGNOSIS — K219 Gastro-esophageal reflux disease without esophagitis: Secondary | ICD-10-CM | POA: Diagnosis present

## 2020-03-18 DIAGNOSIS — I700 Atherosclerosis of aorta: Secondary | ICD-10-CM | POA: Diagnosis not present

## 2020-03-18 DIAGNOSIS — R1900 Intra-abdominal and pelvic swelling, mass and lump, unspecified site: Secondary | ICD-10-CM | POA: Diagnosis present

## 2020-03-18 DIAGNOSIS — E669 Obesity, unspecified: Secondary | ICD-10-CM | POA: Diagnosis present

## 2020-03-18 DIAGNOSIS — K567 Ileus, unspecified: Secondary | ICD-10-CM

## 2020-03-18 DIAGNOSIS — Z8546 Personal history of malignant neoplasm of prostate: Secondary | ICD-10-CM

## 2020-03-18 DIAGNOSIS — R3129 Other microscopic hematuria: Secondary | ICD-10-CM | POA: Diagnosis present

## 2020-03-18 DIAGNOSIS — Z4659 Encounter for fitting and adjustment of other gastrointestinal appliance and device: Secondary | ICD-10-CM

## 2020-03-18 DIAGNOSIS — K5900 Constipation, unspecified: Secondary | ICD-10-CM | POA: Diagnosis not present

## 2020-03-18 DIAGNOSIS — Z0189 Encounter for other specified special examinations: Secondary | ICD-10-CM

## 2020-03-18 LAB — POCT URINALYSIS DIP (DEVICE)
Bilirubin Urine: NEGATIVE
Glucose, UA: NEGATIVE mg/dL
Ketones, ur: NEGATIVE mg/dL
Leukocytes,Ua: NEGATIVE
Nitrite: NEGATIVE
Protein, ur: NEGATIVE mg/dL
Specific Gravity, Urine: 1.01 (ref 1.005–1.030)
Urobilinogen, UA: 0.2 mg/dL (ref 0.0–1.0)
pH: 5 (ref 5.0–8.0)

## 2020-03-18 LAB — COMPREHENSIVE METABOLIC PANEL
ALT: 29 U/L (ref 0–44)
AST: 23 U/L (ref 15–41)
Albumin: 3.3 g/dL — ABNORMAL LOW (ref 3.5–5.0)
Alkaline Phosphatase: 54 U/L (ref 38–126)
Anion gap: 12 (ref 5–15)
BUN: 27 mg/dL — ABNORMAL HIGH (ref 8–23)
CO2: 25 mmol/L (ref 22–32)
Calcium: 8.8 mg/dL — ABNORMAL LOW (ref 8.9–10.3)
Chloride: 95 mmol/L — ABNORMAL LOW (ref 98–111)
Creatinine, Ser: 1.85 mg/dL — ABNORMAL HIGH (ref 0.61–1.24)
GFR calc Af Amer: 42 mL/min — ABNORMAL LOW (ref >60)
GFR calc non Af Amer: 36 mL/min — ABNORMAL LOW (ref >60)
Glucose, Bld: 145 mg/dL — ABNORMAL HIGH (ref 70–99)
Potassium: 3.7 mmol/L (ref 3.5–5.1)
Sodium: 132 mmol/L — ABNORMAL LOW (ref 135–145)
Total Bilirubin: 1.2 mg/dL (ref 0.3–1.2)
Total Protein: 7.8 g/dL (ref 6.5–8.1)

## 2020-03-18 LAB — CBC
HCT: 45 % (ref 39.0–52.0)
Hemoglobin: 14.1 g/dL (ref 13.0–17.0)
MCH: 23.2 pg — ABNORMAL LOW (ref 26.0–34.0)
MCHC: 31.3 g/dL (ref 30.0–36.0)
MCV: 74 fL — ABNORMAL LOW (ref 80.0–100.0)
Platelets: 297 10*3/uL (ref 150–400)
RBC: 6.08 MIL/uL — ABNORMAL HIGH (ref 4.22–5.81)
RDW: 14.9 % (ref 11.5–15.5)
WBC: 14.7 10*3/uL — ABNORMAL HIGH (ref 4.0–10.5)
nRBC: 0 % (ref 0.0–0.2)

## 2020-03-18 LAB — URINALYSIS, ROUTINE W REFLEX MICROSCOPIC
Bilirubin Urine: NEGATIVE
Glucose, UA: NEGATIVE mg/dL
Ketones, ur: NEGATIVE mg/dL
Leukocytes,Ua: NEGATIVE
Nitrite: NEGATIVE
Protein, ur: NEGATIVE mg/dL
Specific Gravity, Urine: 1.011 (ref 1.005–1.030)
pH: 5 (ref 5.0–8.0)

## 2020-03-18 LAB — LIPASE, BLOOD: Lipase: 25 U/L (ref 11–51)

## 2020-03-18 MED ORDER — SODIUM CHLORIDE 0.9% FLUSH: 3.0000 mL | Freq: Once | INTRAVENOUS | Status: DC

## 2020-03-18 NOTE — ED Triage Notes (Signed)
Pt presents to ED POV. Pt c/o generalized abd pain 8/10. Pt reports that he was seen at [facility] today had x-ray that shows concern for SBO. Pt LBM 6h ago. Pt is still passing gas

## 2020-03-18 NOTE — ED Triage Notes (Signed)
C/O constant mid-low abd pain since yesterday without n/v/d.  No known fevers.  Last BM 2 hrs ago - states normal.

## 2020-03-18 NOTE — Discharge Instructions (Signed)
Please go to emergency room for further evaluation  Severe abdominal pain x 1 day, distended- xray concerning for small bowel obstruction/ileus

## 2020-03-19 ENCOUNTER — Emergency Department (HOSPITAL_COMMUNITY): Payer: Medicare HMO

## 2020-03-19 DIAGNOSIS — Z923 Personal history of irradiation: Secondary | ICD-10-CM | POA: Diagnosis not present

## 2020-03-19 DIAGNOSIS — K630 Abscess of intestine: Secondary | ICD-10-CM | POA: Diagnosis present

## 2020-03-19 DIAGNOSIS — Z20822 Contact with and (suspected) exposure to covid-19: Secondary | ICD-10-CM | POA: Diagnosis present

## 2020-03-19 DIAGNOSIS — K56609 Unspecified intestinal obstruction, unspecified as to partial versus complete obstruction: Secondary | ICD-10-CM | POA: Diagnosis present

## 2020-03-19 DIAGNOSIS — K55029 Acute infarction of small intestine, extent unspecified: Secondary | ICD-10-CM | POA: Diagnosis present

## 2020-03-19 DIAGNOSIS — I10 Essential (primary) hypertension: Secondary | ICD-10-CM | POA: Diagnosis present

## 2020-03-19 DIAGNOSIS — E669 Obesity, unspecified: Secondary | ICD-10-CM | POA: Diagnosis present

## 2020-03-19 DIAGNOSIS — K651 Peritoneal abscess: Secondary | ICD-10-CM | POA: Diagnosis present

## 2020-03-19 DIAGNOSIS — G4733 Obstructive sleep apnea (adult) (pediatric): Secondary | ICD-10-CM | POA: Diagnosis present

## 2020-03-19 DIAGNOSIS — Z6833 Body mass index (BMI) 33.0-33.9, adult: Secondary | ICD-10-CM | POA: Diagnosis not present

## 2020-03-19 DIAGNOSIS — R7989 Other specified abnormal findings of blood chemistry: Secondary | ICD-10-CM | POA: Diagnosis not present

## 2020-03-19 DIAGNOSIS — E860 Dehydration: Secondary | ICD-10-CM | POA: Diagnosis present

## 2020-03-19 DIAGNOSIS — N179 Acute kidney failure, unspecified: Secondary | ICD-10-CM | POA: Diagnosis present

## 2020-03-19 DIAGNOSIS — K567 Ileus, unspecified: Secondary | ICD-10-CM | POA: Diagnosis present

## 2020-03-19 DIAGNOSIS — R3129 Other microscopic hematuria: Secondary | ICD-10-CM | POA: Diagnosis present

## 2020-03-19 DIAGNOSIS — K56600 Partial intestinal obstruction, unspecified as to cause: Secondary | ICD-10-CM | POA: Diagnosis not present

## 2020-03-19 DIAGNOSIS — K5669 Other partial intestinal obstruction: Secondary | ICD-10-CM | POA: Diagnosis present

## 2020-03-19 DIAGNOSIS — M109 Gout, unspecified: Secondary | ICD-10-CM | POA: Diagnosis present

## 2020-03-19 DIAGNOSIS — R1900 Intra-abdominal and pelvic swelling, mass and lump, unspecified site: Secondary | ICD-10-CM | POA: Diagnosis present

## 2020-03-19 DIAGNOSIS — Z8546 Personal history of malignant neoplasm of prostate: Secondary | ICD-10-CM | POA: Diagnosis not present

## 2020-03-19 DIAGNOSIS — M1990 Unspecified osteoarthritis, unspecified site: Secondary | ICD-10-CM | POA: Diagnosis present

## 2020-03-19 DIAGNOSIS — Z79899 Other long term (current) drug therapy: Secondary | ICD-10-CM | POA: Diagnosis not present

## 2020-03-19 DIAGNOSIS — D72829 Elevated white blood cell count, unspecified: Secondary | ICD-10-CM | POA: Diagnosis not present

## 2020-03-19 DIAGNOSIS — Z791 Long term (current) use of non-steroidal anti-inflammatories (NSAID): Secondary | ICD-10-CM | POA: Diagnosis not present

## 2020-03-19 DIAGNOSIS — R61 Generalized hyperhidrosis: Secondary | ICD-10-CM | POA: Diagnosis present

## 2020-03-19 DIAGNOSIS — K219 Gastro-esophageal reflux disease without esophagitis: Secondary | ICD-10-CM | POA: Diagnosis present

## 2020-03-19 LAB — CBC WITH DIFFERENTIAL/PLATELET
Abs Immature Granulocytes: 0.13 10*3/uL — ABNORMAL HIGH (ref 0.00–0.07)
Basophils Absolute: 0.1 10*3/uL (ref 0.0–0.1)
Basophils Relative: 1 %
Eosinophils Absolute: 0.1 10*3/uL (ref 0.0–0.5)
Eosinophils Relative: 0 %
HCT: 45 % (ref 39.0–52.0)
Hemoglobin: 14 g/dL (ref 13.0–17.0)
Immature Granulocytes: 1 %
Lymphocytes Relative: 7 %
Lymphs Abs: 1.1 10*3/uL (ref 0.7–4.0)
MCH: 23.2 pg — ABNORMAL LOW (ref 26.0–34.0)
MCHC: 31.1 g/dL (ref 30.0–36.0)
MCV: 74.6 fL — ABNORMAL LOW (ref 80.0–100.0)
Monocytes Absolute: 1 10*3/uL (ref 0.1–1.0)
Monocytes Relative: 7 %
Neutro Abs: 12.3 10*3/uL — ABNORMAL HIGH (ref 1.7–7.7)
Neutrophils Relative %: 84 %
Platelets: 262 10*3/uL (ref 150–400)
RBC: 6.03 MIL/uL — ABNORMAL HIGH (ref 4.22–5.81)
RDW: 15.5 % (ref 11.5–15.5)
WBC: 14.6 10*3/uL — ABNORMAL HIGH (ref 4.0–10.5)
nRBC: 0 % (ref 0.0–0.2)

## 2020-03-19 LAB — HIV ANTIBODY (ROUTINE TESTING W REFLEX): HIV Screen 4th Generation wRfx: NONREACTIVE

## 2020-03-19 LAB — SODIUM, URINE, RANDOM: Sodium, Ur: <10 mmol/L

## 2020-03-19 LAB — CREATININE, URINE, RANDOM: Creatinine, Urine: 53.11 mg/dL

## 2020-03-19 LAB — SARS CORONAVIRUS 2 BY RT PCR (HOSPITAL ORDER, PERFORMED IN ~~LOC~~ HOSPITAL LAB): SARS Coronavirus 2: NEGATIVE

## 2020-03-19 MED ORDER — BISACODYL 10 MG RE SUPP: 10.0000 mg | Freq: Every day | RECTAL | Status: DC | PRN

## 2020-03-19 MED ORDER — LACTATED RINGERS IV SOLN: INTRAVENOUS | Status: AC

## 2020-03-19 MED ORDER — IOHEXOL 300 MG/ML  SOLN
75.0000 mL | Freq: Once | INTRAMUSCULAR | Status: AC | PRN
  Administered 2020-03-19: 75 mL via INTRAVENOUS

## 2020-03-19 MED ORDER — MORPHINE SULFATE (PF) 4 MG/ML IV SOLN
INTRAVENOUS | Status: AC
  Filled 2020-03-19: qty 1

## 2020-03-19 MED ORDER — ONDANSETRON HCL 4 MG/2ML IJ SOLN: 4.0000 mg | Freq: Four times a day (QID) | INTRAMUSCULAR | Status: DC | PRN

## 2020-03-19 MED ORDER — OXYCODONE HCL 5 MG PO TABS
5.0000 mg | ORAL_TABLET | ORAL | Status: DC | PRN
  Administered 2020-03-19: 5 mg via ORAL
  Filled 2020-03-19: qty 1

## 2020-03-19 MED ORDER — DOCUSATE SODIUM 100 MG PO CAPS
100.0000 mg | ORAL_CAPSULE | Freq: Two times a day (BID) | ORAL | Status: DC
  Administered 2020-03-19 – 2020-03-20 (×3): 100 mg via ORAL
  Filled 2020-03-19 (×3): qty 1

## 2020-03-19 MED ORDER — SODIUM CHLORIDE 0.9 % IV BOLUS
1000.0000 mL | Freq: Once | INTRAVENOUS | Status: AC
  Administered 2020-03-19: 1000 mL via INTRAVENOUS

## 2020-03-19 MED ORDER — ONDANSETRON HCL 4 MG/2ML IJ SOLN
4.0000 mg | Freq: Once | INTRAMUSCULAR | Status: AC
  Administered 2020-03-19: 4 mg via INTRAVENOUS

## 2020-03-19 MED ORDER — ACETAMINOPHEN 325 MG PO TABS
650.0000 mg | ORAL_TABLET | Freq: Four times a day (QID) | ORAL | Status: DC
  Administered 2020-03-19: 650 mg via ORAL
  Filled 2020-03-19: qty 2

## 2020-03-19 MED ORDER — ACETAMINOPHEN 325 MG PO TABS
650.0000 mg | ORAL_TABLET | Freq: Four times a day (QID) | ORAL | Status: DC
  Administered 2020-03-19 – 2020-03-30 (×38): 650 mg via ORAL
  Filled 2020-03-19 (×38): qty 2

## 2020-03-19 MED ORDER — MORPHINE SULFATE (PF) 4 MG/ML IV SOLN
4.0000 mg | Freq: Once | INTRAVENOUS | Status: AC
  Administered 2020-03-19: 4 mg via INTRAVENOUS

## 2020-03-19 MED ORDER — HEPARIN SODIUM (PORCINE) 5000 UNIT/ML IJ SOLN
5000.0000 [IU] | Freq: Three times a day (TID) | INTRAMUSCULAR | Status: DC
  Administered 2020-03-19 – 2020-03-26 (×20): 5000 [IU] via SUBCUTANEOUS
  Filled 2020-03-19 (×19): qty 1

## 2020-03-19 MED ORDER — HYDROMORPHONE HCL 1 MG/ML IJ SOLN: 0.5000 mg | INTRAMUSCULAR | Status: DC | PRN

## 2020-03-19 MED ORDER — ONDANSETRON HCL 4 MG/2ML IJ SOLN
INTRAMUSCULAR | Status: AC
  Filled 2020-03-19: qty 2

## 2020-03-19 NOTE — ED Notes (Signed)
Taken to CT.

## 2020-03-19 NOTE — Consult Note (Signed)
[facility] Surgery Consult note Note  Jerry Chapman 03-14-1951  [id number].    Requesting MD: Alecia Lemming PA-C Chief Complaint: Abdominal pain Reason for Consult: Omental infarct PCP:  Forrest Moron, MD   HPI:  Patient is a 69 year old male who presented to the ED last evening with complaints of lower abdominal pain since yesterday without nausea, vomiting or diarrhea.  Over the past week he has had some bloating and abdominal distention/gas in his stomach.  Pain became sharp and constant over the last 24 hours.  No blood in his stool.  He took Pepto-Bismol without relief.  He has also tried Gas-X and milk of magnesia earlier with some relief until he ate a ham biscuit on 7/24.  He has had worsening pain since that time.  He has not eaten or drank much since 7/24.  No known fever, he had a bowel movement 2 hours prior to admission to the ED.  Work-up in the ED: He is afebrile, mildly hypertensive.  No tachycardia.  NA 132, chloride 95, glucose 145, BUN 27, creatinine 1.85, albumin 3.3, lipase 25, AST 23, ALT 29 total bilirubin 1.2.  WBC 814.7, hemoglobin 14.1, hematocrit 45, platelets 297,000.  Urinalysis is negative.  Two-view abdominal films show some dilated loops of small bowel concerning for SBO or ileus.  CT of the abdomen with contrast today shows: Bilateral mostly linear consolidation likely atelectasis and less likely effusion.  Stomach is normal appendix is normal colon is unremarkable.  There are a few mildly dilated small bowel loops in the left to mid upper abdomen measuring 3.7 cm in diameter.  There is a 3 cm oval fatty thin rim fatty structure over the left lower quadrant just lateral to the junction of the descending to sigmoid colon likely focus of diploic appendagitis, irregular solid mass with adjacent fat stranding over the anterior perineum just left of the midline abutting the anterior wall of the small bowel loop.  This measures 2.8 x 3.4 x 4.1 it appears to be an  omental infarct.  We are asked to see.  ROS: Review of Systems  Constitutional: Negative.   HENT: Negative.   Eyes: Negative.   Respiratory: Positive for cough (pain with cough). Negative for hemoptysis, sputum production, shortness of breath and wheezing.   Cardiovascular: Negative.   Gastrointestinal: Positive for abdominal pain (LLQ and now some in the LUQ). Negative for blood in stool, constipation, diarrhea, heartburn, melena, nausea and vomiting.  Genitourinary: Negative.        He had radiation seeds for his prostate cancer  Musculoskeletal: Negative.   Skin: Negative.   Neurological: Negative.   Endo/Heme/Allergies: Negative.   Psychiatric/Behavioral: Negative.     Family History  Problem Relation Age of Onset  . Colon cancer Neg Hx   . Pancreatic cancer Neg Hx   . Stomach cancer Neg Hx   . Esophageal cancer Neg Hx   . Colon polyps Neg Hx   . Rectal cancer Neg Hx     Past Medical History:  Diagnosis Date  . Arthritis, gouty    as of 02-07-2016  . At risk for sleep apnea    STOP-BANG= 5      SENT TO PCP 02-07-2016  . Bilateral hydrocele   . Cancer ([place]) over 10 years ago    prostate cancer   . Frequency of urination   . History of prostate cancer UROLOGIST--  DR Karsten Ro--  last PSA in 2014 (0.79)   dx 2004--  T1c,  Gleason 3+3, PSA 10.5  --  completed IM radiation therapy 04/ 2006  . Hypertension   . Right rotator cuff tear    per pt will be scheduling surgery this summer    Past Surgical History:  Procedure Laterality Date  . CIRCUMCISION N/A 07/05/2013   Procedure: CIRCUMCISION ADULT, aspiration of right hydrocele, meatal dilitation;  Surgeon: Claybon Jabs, MD;  Location: [facility];  Service: Urology;  Laterality: N/A;  . COLONOSCOPY  last 07/19/2013  . CYSTOSCOPY  02/12/2016   Procedure: CYSTOSCOPY;  Surgeon: Kathie Rhodes, MD;  Location: [facility];  Service: Urology;;  . Carollee Herter EXCISION Bilateral 02/12/2016    Procedure: HYDROCELECTOMY ADULT;  Surgeon: Kathie Rhodes, MD;  Location: [facility];  Service: Urology;  Laterality: Bilateral;  . POLYPECTOMY      Social History:  reports that he has never smoked. He has never used smokeless tobacco. He reports current alcohol use of about 2.0 standard drinks of alcohol per week. He reports that he does not use drugs. 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 Authorizing Provider  allopurinol (ZYLOPRIM) 300 MG tablet Take 1 tablet (300 mg total) by mouth daily. 07/05/19   Rutherford Guys, MD  amLODipine (NORVASC) 10 MG tablet TAKE 1 TABLET(10 MG) BY MOUTH DAILY 12/30/19   Delia Chimes A, MD  Colchicine (MITIGARE) 0.6 MG CAPS Take 1 capsule by mouth daily. Dispense as Mitigare. 11/15/19   Forrest Moron, MD  ketoconazole (NIZORAL) 2 % shampoo Apply 1 application topically 2 (two) times a week. 10/05/18   Forrest Moron, MD  naproxen (NAPROSYN) 500 MG tablet TAKE 1 TABLET(500 MG) BY MOUTH EVERY DAY WITH A MEAL 02/10/20   Rutherford Guys, MD     Blood pressure (!) 140/96, pulse 75, temperature 98.9 F (37.2 C), temperature source Oral, resp. rate 21, SpO2 96 %. Physical Exam:  General: pleasant, WD, obese WN AA male who is laying in bed in NAD HEENT: head is normocephalic, atraumatic.  Sclera are noninjected.  Pupils are equal.  Ears and nose without any masses or lesions.  Mouth is pink and moist Heart: regular, rate, and rhythm.  Normal s1,s2. No obvious murmurs, gallops, or rubs noted.  Palpable radial and pedal pulses bilaterally Lungs: CTAB, no wheezes, rhonchi, or rales noted.  Respiratory effort nonlabored Abd: soft, mildly tender now LLQ and LUQ, hurts more with movement and cough, +BS, no masses, hernias, or organomegaly MS: all 4 extremities are symmetrical with no cyanosis,  clubbing, or edema. Skin: warm and dry with no masses, lesions, or rashes Neuro: Cranial nerves 2-12 grossly intact, sensation is normal throughout Psych: A&Ox3 with an appropriate affect.   Results for orders placed or performed during the hospital encounter of 03/18/20 (from the past 48 hour(s))  Lipase, blood     Status: None   Collection Time: 03/18/20  7:54 PM  Result Value Ref Range   Lipase 25 11 - 51 U/L    Comment: Performed at [facility], [place] [address] [place], [place] [address]  Comprehensive metabolic panel     Status: Abnormal   Collection Time: 03/18/20  7:54 PM  Result Value Ref Range   Sodium 132 (L) 135 - 145 mmol/L   Potassium 3.7 3.5 - 5.1 mmol/L   Chloride 95 (L) 98 -  111 mmol/L   CO2 25 22 - 32 mmol/L   Glucose, Bld 145 (H) 70 - 99 mg/dL    Comment: Glucose reference range applies only to samples taken after fasting for at least 8 hours.   BUN 27 (H) 8 - 23 mg/dL   Creatinine, Ser 1.85 (H) 0.61 - 1.24 mg/dL   Calcium 8.8 (L) 8.9 - 10.3 mg/dL   Total Protein 7.8 6.5 - 8.1 g/dL   Albumin 3.3 (L) 3.5 - 5.0 g/dL   AST 23 15 - 41 U/L   ALT 29 0 - 44 U/L   Alkaline Phosphatase 54 38 - 126 U/L   Total Bilirubin 1.2 0.3 - 1.2 mg/dL   GFR calc non Af Amer 36 (L) >60 mL/min   GFR calc Af Amer 42 (L) >60 mL/min   Anion gap 12 5 - 15    Comment: Performed at [facility] [address], [place], [place] [address]  CBC     Status: Abnormal   Collection Time: 03/18/20  7:54 PM  Result Value Ref Range   WBC 14.7 (H) 4.0 - 10.5 K/uL   RBC 6.08 (H) 4.22 - 5.81 MIL/uL   Hemoglobin 14.1 13.0 - 17.0 g/dL   HCT 45.0 39 - 52 %   MCV 74.0 (L) 80.0 - 100.0 fL   MCH 23.2 (L) 26.0 - 34.0 pg   MCHC 31.3 30.0 - 36.0 g/dL   RDW 14.9 11.5 - 15.5 %   Platelets 297 150 - 400 K/uL   nRBC 0.0 0.0 - 0.2 %    Comment: Performed at [facility], [place] [address] [place], [place] [address]  Urinalysis, Routine w reflex microscopic     Status: Abnormal   Collection  Time: 03/18/20  8:12 PM  Result Value Ref Range   Color, Urine YELLOW YELLOW   APPearance HAZY (A) CLEAR   Specific Gravity, Urine 1.011 1.005 - 1.030   pH 5.0 5.0 - 8.0   Glucose, UA NEGATIVE NEGATIVE mg/dL   Hgb urine dipstick SMALL (A) NEGATIVE   Bilirubin Urine NEGATIVE NEGATIVE   Ketones, ur NEGATIVE NEGATIVE mg/dL   Protein, ur NEGATIVE NEGATIVE mg/dL   Nitrite NEGATIVE NEGATIVE   Leukocytes,Ua NEGATIVE NEGATIVE   RBC / HPF 0-5 0 - 5 RBC/hpf   WBC, UA 0-5 0 - 5 WBC/hpf   Bacteria, UA RARE (A) NONE SEEN   Squamous Epithelial / LPF 0-5 0 - 5   Mucus PRESENT    Hyaline Casts, UA PRESENT     Comment: Performed at [facility], [place] [address], [place], [place] [address]   CT ABDOMEN PELVIS W CONTRAST  Result Date: 03/19/2020 CLINICAL DATA:  Generalized abdominal pain. Possible small bowel obstruction. EXAM: CT ABDOMEN AND PELVIS WITH CONTRAST TECHNIQUE: Multidetector CT imaging of the abdomen and pelvis was performed using the standard protocol following bolus administration of intravenous contrast. CONTRAST:  45mL OMNIPAQUE IOHEXOL 300 MG/ML  SOLN COMPARISON:  02/08/2016 FINDINGS: Lower chest: Lung bases demonstrate bilateral peripheral mostly linear consolidation likely atelectasis and less likely infection. No effusion. Mild elevation of the left hemidiaphragm. Calcified plaque in the region of the left anterior descending coronary artery. Hepatobiliary: Liver, gallbladder and biliary tree are normal. Pancreas: Normal. Spleen: Normal. Adrenals/Urinary Tract: Adrenal glands are normal. Kidneys are normal in size without hydronephrosis or nephrolithiasis. Multiple bilateral renal cysts are present. Ureters and bladder are normal. Stomach/Bowel: Stomach is normal. Appendix is normal. Colon is unremarkable. There are  a few mildly dilated small bowel loops in the left mid to upper abdomen measuring up to 3.7 cm in diameter. Vascular/Lymphatic: Minimal calcified plaque over the abdominal  aorta which is normal in caliber. No significant adenopathy. Reproductive: Prostate gland is normal in size. Fiducial markers present over the prostate likely from previous radiation treatment for prostate cancer. Other: 3 cm oval fatty thin rimmed fatty structure over the left lower quadrant just lateral to the junction of the descending to sigmoid colon likely small focus of epiploic appendagitis. Irregular heterogeneous solid mass with adjacent fat stranding over the anterior peritoneum just left of midline abutting the anterior wall of an adjacent small bowel loop. This measures 2.8 x 3.4 x 4.1 cm in AP, transverse and craniocaudal dimension. There is a fat plane between this mass and the adjacent small bowel loop which has mildly thickened wall. This likely causes of partial degree of obstruction as there are a few mildly dilated small bowel loops just proximal to this region as described previously. Patchy adjacent fluid and fluid within the adjacent small bowel mesentery. This small heterogeneous mass is indeterminate and may be due to a benign process such as omental infarction, although remain concerning for a neoplastic process such as peritoneal spread of underlying malignancy. Musculoskeletal: No focal abnormality. IMPRESSION: 1. Heterogeneous mass over the anterior peritoneum left of midline measuring 2.8 x 3.4 x 4.1 cm with adjacent fat stranding and patchy mesenteric fluid. This likely causes a partial degree of small bowel obstruction with a few associated mildly dilated jejunal loops. Findings are indeterminate and may represent focal omental infarction versus peritoneal spread of underlying malignancy. 2. 3 cm thin rimmed oval fatty structure over the left lower quadrant just lateral to the colon compatible with mild acute epiploic appendagitis. 3. Heterogeneous bibasilar consolidation likely atelectasis less likely infection. 4.  Bilateral renal cysts. 5. Aortic Atherosclerosis (ICD10-I70.0). Mild  atherosclerotic coronary artery disease. Electronically Signed   By: Marin Olp M.D.   On: 03/19/2020 09:01   DG Abd 2 Views  Result Date: 03/18/2020 CLINICAL DATA:  Increased bloating. EXAM: ABDOMEN - 2 VIEW COMPARISON:  February 08, 2016 FINDINGS: There are dilated loops of small bowel scattered throughout the abdomen measuring up to approximately 4.8 cm. There are air-fluid levels. There are airspace opacities at the lung bases, favored to represent atelectasis. IMPRESSION: Dilated loops of small bowel concerning for small bowel obstruction or ileus. Electronically Signed   By: Constance Holster M.D.   On: 03/18/2020 19:06      Assessment/Plan AKI Prostate cancer -radiation therapy Arthritis Gout Hypertension Sleep apnea - tested, but never got the CPAP machine  Abdominal pain with omental infarction/ileus  FEN: IV fluids/clear liquids ID: none DVT:  He can have DVT prophylaxis from our standpoint  Plan:  Admit to Medicine.  IV fluid hydration, clear liquids as tolerated.  If he has nausea or vomiting hold clear liquids and place an NG.  No surgical treatment for the infarcted omentum, unless he has ongoing obstruction from this.  What we see currently is most like a low grade ileus.  We will follow with you.     Earnstine Regal [facility] Surgery 03/19/2020, 9:52 AM Please see Amion for pager number during day hours 7:00am-4:30pm

## 2020-03-19 NOTE — ED Notes (Signed)
Clear liquid lunch tray ordered 

## 2020-03-19 NOTE — ED Provider Notes (Signed)
Jerry Chapman    CSN: [id number] Arrival date & time: 03/18/20  1639      History   Chief Complaint Chief Complaint  Patient presents with   Abdominal Pain    HPI Jerry Chapman is a 69 y.o. male history of prostate cancer, presenting today for evaluation of abdominal pain.  Patient reports that over the past week he has had some bloating and sensation of distention and gas in his stomach.  Over the past 24 hours he began to have increased abdominal pain especially to his lower middle abdomen.  Pain is described as sharp and constant.  He denies any associated nausea or vomiting.  His bowel movements have been normal and regular.  Reports daily bowel movements of normal size and consistency.  Denies straining with passing bowels.  He denies blood in stool.  He tried taking Pepto-Bismol without relief.  Earlier in the week he had tried taking Gas-X and milk of magnesia.  Denies prior GI problems or prior abdominal surgeries.  Prostate cancer treated with seeds.  He also reports slight difficulty urinating.  Denies dysuria or hematuria.  HPI  Past Medical History:  Diagnosis Date   Arthritis, gouty    as of 02-07-2016   At risk for sleep apnea    STOP-BANG= 5      SENT TO PCP 02-07-2016   Bilateral hydrocele    Cancer ([facility]) over 10 years ago    prostate cancer    Frequency of urination    History of prostate cancer UROLOGIST--  DR Karsten Ro--  last PSA in 2014 (0.79)   dx 2004--  T1c, Gleason 3+3, PSA 10.5  --  completed IM radiation therapy 04/ 2006   Hypertension    Right rotator cuff tear    per pt will be scheduling surgery this summer    Patient Active Problem List   Diagnosis Date Noted   Injury of left leg 01/16/2017   Closed fracture of bone of left foot 01/16/2017   OSA (obstructive sleep apnea) 08/05/2016   Oxygen desaturation during REM sleep 08/05/2016   Morbid obesity ([place]) 08/05/2016   Dermatitis 06/11/2016   Class 1 obesity due to  excess calories without serious comorbidity with body mass index (BMI) of 32.0 to 32.9 in adult 06/11/2016   Morbid obesity due to excess calories ([place]) 03/20/2016   Snoring 03/20/2016   Sleep apnea 03/20/2016   Hypersomnia with sleep apnea 03/20/2016   Hematuria 02/26/2016   Adenocarcinoma of prostate ([place]) 07/16/2015   Acquired phimosis 07/05/2013   HTN (hypertension) 05/09/2013   Gout 05/09/2013   Renal insufficiency 05/09/2013    Past Surgical History:  Procedure Laterality Date   CIRCUMCISION N/A 07/05/2013   Procedure: CIRCUMCISION ADULT, aspiration of right hydrocele, meatal dilitation;  Surgeon: Claybon Jabs, MD;  Location: [facility];  Service: Urology;  Laterality: N/A;   COLONOSCOPY  last 07/19/2013   CYSTOSCOPY  02/12/2016   Procedure: CYSTOSCOPY;  Surgeon: Kathie Rhodes, MD;  Location: [facility];  Service: Urology;;   HYDROCELE EXCISION Bilateral 02/12/2016   Procedure: HYDROCELECTOMY ADULT;  Surgeon: Kathie Rhodes, MD;  Location: [facility];  Service: Urology;  Laterality: Bilateral;   POLYPECTOMY         Home Medications    Prior to Admission medications   Medication Sig Start Date End Date Taking? Authorizing Provider  allopurinol (ZYLOPRIM) 300 MG tablet Take 1 tablet (300 mg total) by mouth daily. 07/05/19  Yes  Rutherford Guys, MD  amLODipine (NORVASC) 10 MG tablet TAKE 1 TABLET(10 MG) BY MOUTH DAILY 12/30/19  Yes Stallings, Zoe A, MD  Colchicine (MITIGARE) 0.6 MG CAPS Take 1 capsule by mouth daily. Dispense as Mitigare. 11/15/19  Yes Stallings, Zoe A, MD  ketoconazole (NIZORAL) 2 % shampoo Apply 1 application topically 2 (two) times a week. 10/05/18  Yes Stallings, Zoe A, MD  naproxen (NAPROSYN) 500 MG tablet TAKE 1 TABLET(500 MG) BY MOUTH EVERY DAY WITH A MEAL 02/10/20  Yes Rutherford Guys, MD    Family History Family History  Problem Relation Age of Onset   Colon cancer Neg Hx    Pancreatic  cancer Neg Hx    Stomach cancer Neg Hx    Esophageal cancer Neg Hx    Colon polyps Neg Hx    Rectal cancer Neg Hx     Social History Social History   Tobacco Use   Smoking status: Never Smoker   Smokeless tobacco: Never Used  Vaping Use   Vaping Use: Never used  Substance Use Topics   Alcohol use: Yes    Alcohol/week: 2.0 standard drinks    Types: 2 Standard drinks or equivalent per week   Drug use: No     Allergies   Patient has no known allergies.   Review of Systems Review of Systems  Constitutional: Negative for fatigue and fever.  HENT: Negative for congestion, sinus pressure and sore throat.   Eyes: Negative for photophobia, pain and visual disturbance.  Respiratory: Negative for cough and shortness of breath.   Cardiovascular: Negative for chest pain.  Gastrointestinal: Positive for abdominal pain. Negative for blood in stool, constipation, nausea and vomiting.  Genitourinary: Negative for decreased urine volume and hematuria.  Musculoskeletal: Negative for myalgias, neck pain and neck stiffness.  Neurological: Negative for dizziness, syncope, facial asymmetry, speech difficulty, weakness, light-headedness, numbness and headaches.     Physical Exam Triage Vital Signs ED Triage Vitals [03/18/20 1706]  Enc Vitals Group     BP (!) 133/84     Pulse Rate 84     Resp 22     Temp 98.7 F (37.1 C)     Temp Source Oral     SpO2 96 %     Weight      Height      Head Circumference      Peak Flow      Pain Score 8     Pain Loc      Pain Edu?      Excl. in [place]?    No data found.  Updated Vital Signs BP (!) 133/84    Pulse 84    Temp 98.7 F (37.1 C) (Oral)    Resp 22    SpO2 96%   Visual Acuity Right Eye Distance:   Left Eye Distance:   Bilateral Distance:    Right Eye Near:   Left Eye Near:    Bilateral Near:     Physical Exam Vitals and nursing note reviewed.  Constitutional:      Appearance: He is well-developed.     Comments: No  acute distress  HENT:     Head: Normocephalic and atraumatic.     Nose: Nose normal.  Eyes:     Conjunctiva/sclera: Conjunctivae normal.  Cardiovascular:     Rate and Rhythm: Normal rate.  Pulmonary:     Effort: Pulmonary effort is normal. No respiratory distress.  Abdominal:     General: There is distension.  Comments: Abdomen distended throughout and is slightly tight, tender to palpation of epigastrium, mid upper abdomen and mid lower abdomen, more focal to lower abdomen, negative Murphy's, negative McBurney's, negative Rovsing.  Musculoskeletal:        General: Normal range of motion.     Cervical back: Neck supple.  Skin:    General: Skin is warm and dry.  Neurological:     Mental Status: He is alert and oriented to person, place, and time.      UC Treatments / Results  Labs (all labs ordered are listed, but only abnormal results are displayed) Labs Reviewed  POCT URINALYSIS DIP (DEVICE) - Abnormal; Notable for the following components:      Result Value   Hgb urine dipstick TRACE (*)    All other components within normal limits    EKG   Radiology DG Abd 2 Views  Result Date: 03/18/2020 CLINICAL DATA:  Increased bloating. EXAM: ABDOMEN - 2 VIEW COMPARISON:  February 08, 2016 FINDINGS: There are dilated loops of small bowel scattered throughout the abdomen measuring up to approximately 4.8 cm. There are air-fluid levels. There are airspace opacities at the lung bases, favored to represent atelectasis. IMPRESSION: Dilated loops of small bowel concerning for small bowel obstruction or ileus. Electronically Signed   By: Constance Holster M.D.   On: 03/18/2020 19:06    Procedures Procedures (including critical care time)  Medications Ordered in UC Medications - No data to display  Initial Impression / Assessment and Plan / UC Course  I have reviewed the triage vital signs and the nursing notes.  Pertinent labs & imaging results that were available during my care of  the patient were reviewed by me and considered in my medical decision making (see chart for details).     UA with negative leuks and nitrites, trace hemoglobin.  Abdominal film concerning for SBO versus ileus.  Given this recommended patient to follow-up in emergency room for further evaluation with CT scanning.  No acute distress at this time and stable, sent independently.  Patient verbalized understanding and expresses intent to go to emergency room.  Discussed strict return precautions. Patient verbalized understanding and is agreeable with plan.  Final Clinical Impressions(s) / UC Diagnoses   Final diagnoses:  Lower abdominal pain     Discharge Instructions     Please go to emergency room for further evaluation  Severe abdominal pain x 1 day, distended- xray concerning for small bowel obstruction/ileus   ED Prescriptions    None     PDMP not reviewed this encounter.   Joneen Caraway [place] C, PA-C 03/19/20 [phone]

## 2020-03-19 NOTE — H&P (Addendum)
Date: 03/19/2020               Patient Name:  Jerry Chapman MRN: [id number]  DOB: Aug 06, 1951 Age / Sex: 69 y.o., male   PCP: Forrest Moron, MD         Medical Service: Internal Medicine Teaching Service         Attending Physician: Dr. Sid Falcon, MD    First Contact: Dr. Alfonse Spruce Pager: [phone]  Second Contact: Dr. Eileen Stanford Pager: [phone]       After Hours (After 5p/  First Contact Pager: [phone]  weekends / holidays): Second Contact Pager: [phone]   Chief Complaint: Abdominal pain  History of Present Illness:  Jerry Chapman is a 69 year old male with PMH of prostate cancer (radiation treatment years ago), HTN and gout, who present to the hospital for chief complain of abdominal pain.   He was in his usual state os health until last Thursday when he began experiencing lower abdominal pain, bloating and distension which has been gradually worsening. He describes the pain as "tooth ache", which comes and goes. He states that pain is located at the epigastric and lower middle abdomen area. The pain is exacerbated with movement and is not relieved with anything. Since Friday, he has not been able to tolerate any diet but had several slices of breath yesterday. He also noticed abdominal bloating for which he took magnesium citrate. His last bowel movement was yesterday and was formed. He only endorses a longstanding history of night sweats around 2 AM. He denies fevers, chills, nausea, vomiting, chest pain.  ED course: Patient appears comfortable and in no acute distress on exam. He had a leukocytosis of 14.7 but was afebrile. No sign or symptoms of infections. Creatine elevated at 1.85 and baseline is around 1.1. UA was unremarkable. CT abd/pelvis shows a heterogeneous mass over the anterior peritoneum left of midline measuring 2.8 x 3.4 x 4.1 cm which likely causes a partial degree of SBO vs omental infarct. Surgery was consulted. Patient was admitted for management of SBO and  AKI.   Meds:  No current facility-administered medications on file prior to encounter.   Current Outpatient Medications on File Prior to Encounter  Medication Sig  . allopurinol (ZYLOPRIM) 300 MG tablet Take 1 tablet (300 mg total) by mouth daily.  Marland Kitchen amLODipine (NORVASC) 10 MG tablet TAKE 1 TABLET(10 MG) BY MOUTH DAILY  . Colchicine (MITIGARE) 0.6 MG CAPS Take 1 capsule by mouth daily. Dispense as Mitigare.  Marland Kitchen ketoconazole (NIZORAL) 2 % shampoo Apply 1 application topically 2 (two) times a week.  . naproxen (NAPROSYN) 500 MG tablet TAKE 1 TABLET(500 MG) BY MOUTH EVERY DAY WITH A MEAL     Allergies: Allergies as of 03/18/2020  . (No Known Allergies)   Past Medical History:  Diagnosis Date  . Arthritis, gouty    as of 02-07-2016  . At risk for sleep apnea    STOP-BANG= 5      SENT TO PCP 02-07-2016  . Bilateral hydrocele   . Cancer ([place]) over 10 years ago    prostate cancer   . Frequency of urination   . History of prostate cancer UROLOGIST--  DR Karsten Ro--  last PSA in 2014 (0.79)   dx 2004--  T1c, Gleason 3+3, PSA 10.5  --  completed IM radiation therapy 04/ 2006  . Hypertension   . Right rotator cuff tear    per pt will be scheduling surgery this  summer    Family History  Problem Relation Age of Onset  . Colon cancer Neg Hx   . Pancreatic cancer Neg Hx   . Stomach cancer Neg Hx   . Esophageal cancer Neg Hx   . Colon polyps Neg Hx   . Rectal cancer Neg Hx     Social History:  -Drinks 1 beer/night, couple liquor cocktail on the weekend, denies illicit drug use  - No smoking history  Review of Systems: A complete ROS was negative except as per HPI.   Physical Exam: Blood pressure (!) 131/83, pulse 69, temperature 98.9 F (37.2 C), temperature source Oral, resp. rate (!) 24, SpO2 92 %. Physical Exam Constitutional:      General: He is not in acute distress.    Appearance: He is not toxic-appearing.  HENT:     Head: Normocephalic.  Eyes:     General: No  scleral icterus.       Right eye: No discharge.        Left eye: No discharge.     Conjunctiva/sclera: Conjunctivae normal.  Cardiovascular:     Rate and Rhythm: Normal rate and regular rhythm.     Heart sounds: Normal heart sounds. No murmur heard.   Pulmonary:     Effort: Pulmonary effort is normal. No respiratory distress.     Breath sounds: Normal breath sounds. No wheezing.  Abdominal:     General: There is distension.     Tenderness: There is abdominal tenderness.     Comments: Abdomen distended. Tender to palpation at epigastric, LUQ and lower mid abdomen. Negative Murphy sign. Negative McBurney point. No guarding or rebound tenderness. Normal bowel sound. Percussion similar all 4 quadrants.   Musculoskeletal:     Cervical back: Normal range of motion.     Right lower leg: No edema.     Left lower leg: No edema.  Skin:    General: Skin is warm.     Coloration: Skin is not jaundiced.  Neurological:     Mental Status: He is alert.  Psychiatric:        Mood and Affect: Mood normal.        Thought Content: Thought content normal.     EKG: personally reviewed my interpretation is Normal sinus rhythm , rate 72, no evidence of ischemia.    Assessment & Plan by Problem: Principal Problem:   Small bowel obstruction (HCC) Active Problems:   HTN (hypertension)   Adenocarcinoma of prostate (HCC)   AKI (acute kidney injury) ([place])   Abdominal mass   Microscopic hematuria  Jerry Chapman is a 69 year old male with PMH of prostate cancer, HTN and gout here for management for Partial SBO.   #Partial SBO: Abdominal pain likely secondary to SBO vs omental infarct. SBO secondary to peritoneal mass discovered on CT abdomen and pelvis. Mass measures 2.8 x 3.4 x 4.1 cm. It is reassuring that patient is passing flatus and is having bowel movement. Will hold off on NGT placement for now but low threshold to place NGT if patient begins to vomit. Surgery was consulted and do not recommend  operation at this time. Diagnostic laparoscopy will be considered if pain not improving or develop obstruction.   -Clear liquid diet - NG tube if patient starts to vomit - IVF with LR at 125 mL/hr - Pain regimen: Dilaudid 0.5mg  q4hrs prn - Zofran PRN for nausea - Bowel regimen with Dulcolax and Docusate.   #AKI: FeNa 0.3%, consistent with pre-renal.  sCr 1.5 (Baseline 1.1) - IVF fluid challenge  - Follow up BMP  #Reactive Leukocytosis: No evidence of infection. He remains afebrile.  - Continue to monitor   #HTN: - Hold home amlodipine for now   #Hx of prostate cancer: s/p Radiation therapy in 2006. Currently under surveillance q 6 months.   #Gout: - Hold allopurinol, naproxen and colchicine in the setting of AKI  #OSA - CPAP at night   #Microscopic hematuria: No evidence of gross hematuria - Continue to monitor - Recheck UA outpatient and if persistent, will warrant Urology evaluation for cystoscopy    Dispo: Admit patient to Inpatient with expected length of stay greater than 2 midnights.  Signed: Jean Rosenthal, MD 03/19/2020, 11:13 AM  Pager: [phone] After 5pm on weekdays and 1pm on weekends: On Call pager: [phone]

## 2020-03-19 NOTE — ED Provider Notes (Signed)
[facility] EMERGENCY DEPARTMENT Provider Note   CSN: [id number] Arrival date & time: 03/18/20  1927     History Chief Complaint  Patient presents with  . Abdominal Pain    Jerry Chapman is a 69 y.o. male.  Patient with history of prostate cancer, no previous abdominal surgeries presents to emergency department with abdominal pain.  Patient states that he developed bloating in his abdomen approximately 3 days ago.  Pain became more pronounced and gradually worsened 2 days ago, after eating.  Yesterday presented to an urgent care where he had a plain film x-ray which had concerns for small bowel obstruction.  He is was subsequently referred to the emergency department.  He has waited in the waiting room for approximately 12 hours.  He denies vomiting.  Last bowel movement was yesterday.  He is passing some gas.  He denies fevers, chest pain, shortness of breath.  The onset of this condition was acute. The course is constant. Aggravating factors: none. Alleviating factors: none.          Past Medical History:  Diagnosis Date  . Arthritis, gouty    as of 02-07-2016  . At risk for sleep apnea    STOP-BANG= 5      SENT TO PCP 02-07-2016  . Bilateral hydrocele   . Cancer ([place]) over 10 years ago    prostate cancer   . Frequency of urination   . History of prostate cancer UROLOGIST--  DR Karsten Ro--  last PSA in 2014 (0.79)   dx 2004--  T1c, Gleason 3+3, PSA 10.5  --  completed IM radiation therapy 04/ 2006  . Hypertension   . Right rotator cuff tear    per pt will be scheduling surgery this summer    Patient Active Problem List   Diagnosis Date Noted  . Injury of left leg 01/16/2017  . Closed fracture of bone of left foot 01/16/2017  . OSA (obstructive sleep apnea) 08/05/2016  . Oxygen desaturation during REM sleep 08/05/2016  . Morbid obesity ([place]) 08/05/2016  . Dermatitis 06/11/2016  . Class 1 obesity due to excess calories without serious comorbidity  with body mass index (BMI) of 32.0 to 32.9 in adult 06/11/2016  . Morbid obesity due to excess calories ([place]) 03/20/2016  . Snoring 03/20/2016  . Sleep apnea 03/20/2016  . Hypersomnia with sleep apnea 03/20/2016  . Hematuria 02/26/2016  . Adenocarcinoma of prostate ([place]) 07/16/2015  . Acquired phimosis 07/05/2013  . HTN (hypertension) 05/09/2013  . Gout 05/09/2013  . Renal insufficiency 05/09/2013    Past Surgical History:  Procedure Laterality Date  . CIRCUMCISION N/A 07/05/2013   Procedure: CIRCUMCISION ADULT, aspiration of right hydrocele, meatal dilitation;  Surgeon: Claybon Jabs, MD;  Location: [facility];  Service: Urology;  Laterality: N/A;  . COLONOSCOPY  last 07/19/2013  . CYSTOSCOPY  02/12/2016   Procedure: CYSTOSCOPY;  Surgeon: Kathie Rhodes, MD;  Location: [facility];  Service: Urology;;  . Carollee Herter EXCISION Bilateral 02/12/2016   Procedure: HYDROCELECTOMY ADULT;  Surgeon: Kathie Rhodes, MD;  Location: [facility];  Service: Urology;  Laterality: Bilateral;  . POLYPECTOMY         Family History  Problem Relation Age of Onset  . Colon cancer Neg Hx   . Pancreatic cancer Neg Hx   . Stomach cancer Neg Hx   . Esophageal cancer Neg Hx   . Colon polyps Neg Hx   . Rectal cancer Neg Hx  Social History   Tobacco Use  . Smoking status: Never Smoker  . Smokeless tobacco: Never Used  Vaping Use  . Vaping Use: Never used  Substance Use Topics  . Alcohol use: Yes    Alcohol/week: 2.0 standard drinks    Types: 2 Standard drinks or equivalent per week  . Drug use: No    Home Medications Prior to Admission medications   Medication Sig Start Date End Date Taking? Authorizing Provider  allopurinol (ZYLOPRIM) 300 MG tablet Take 1 tablet (300 mg total) by mouth daily. 07/05/19   Rutherford Guys, MD  amLODipine (NORVASC) 10 MG tablet TAKE 1 TABLET(10 MG) BY MOUTH DAILY 12/30/19   Delia Chimes A, MD  Colchicine (MITIGARE)  0.6 MG CAPS Take 1 capsule by mouth daily. Dispense as Mitigare. 11/15/19   Forrest Moron, MD  ketoconazole (NIZORAL) 2 % shampoo Apply 1 application topically 2 (two) times a week. 10/05/18   Forrest Moron, MD  naproxen (NAPROSYN) 500 MG tablet TAKE 1 TABLET(500 MG) BY MOUTH EVERY DAY WITH A MEAL 02/10/20   Rutherford Guys, MD    Allergies    Patient has no known allergies.  Review of Systems   Review of Systems  Constitutional: Negative for fever.  HENT: Negative for rhinorrhea and sore throat.   Eyes: Negative for redness.  Respiratory: Negative for cough.   Cardiovascular: Negative for chest pain.  Gastrointestinal: Positive for abdominal distention, abdominal pain and nausea. Negative for constipation, diarrhea and vomiting.  Genitourinary: Negative for dysuria.  Musculoskeletal: Negative for myalgias.  Skin: Negative for rash.  Neurological: Negative for headaches.    Physical Exam Updated Vital Signs BP (!) 140/96   Pulse 75   Temp 98.9 F (37.2 C) (Oral)   Resp 21   SpO2 96%   Physical Exam Vitals and nursing note reviewed.  Constitutional:      Appearance: He is well-developed.  HENT:     Head: Normocephalic and atraumatic.  Eyes:     General:        Right eye: No discharge.        Left eye: No discharge.     Conjunctiva/sclera: Conjunctivae normal.  Cardiovascular:     Rate and Rhythm: Normal rate and regular rhythm.     Heart sounds: Normal heart sounds.  Pulmonary:     Effort: Pulmonary effort is normal.     Breath sounds: Normal breath sounds.  Abdominal:     General: There is distension.     Palpations: Abdomen is soft.     Tenderness: There is abdominal tenderness (Moderate) in the epigastric area, periumbilical area, suprapubic area, left upper quadrant and left lower quadrant. There is no guarding or rebound. Negative signs include Murphy's sign.     Comments: Abdomen appears distended.  Tympanic to percussion in the upper third of the abdomen  with more dull percussion notes inferiorly.  Musculoskeletal:     Cervical back: Normal range of motion and neck supple.  Skin:    General: Skin is warm and dry.  Neurological:     Mental Status: He is alert.     ED Results / Procedures / Treatments   Labs (all labs ordered are listed, but only abnormal results are displayed) Labs Reviewed  COMPREHENSIVE METABOLIC PANEL - Abnormal; Notable for the following components:      Result Value   Sodium 132 (*)    Chloride 95 (*)    Glucose, Bld 145 (*)  BUN 27 (*)    Creatinine, Ser 1.85 (*)    Calcium 8.8 (*)    Albumin 3.3 (*)    GFR calc non Af Amer 36 (*)    GFR calc Af Amer 42 (*)    All other components within normal limits  CBC - Abnormal; Notable for the following components:   WBC 14.7 (*)    RBC 6.08 (*)    MCV 74.0 (*)    MCH 23.2 (*)    All other components within normal limits  URINALYSIS, ROUTINE W REFLEX MICROSCOPIC - Abnormal; Notable for the following components:   APPearance HAZY (*)    Hgb urine dipstick SMALL (*)    Bacteria, UA RARE (*)    All other components within normal limits  SARS CORONAVIRUS 2 BY RT PCR (HOSPITAL ORDER, [facility])  LIPASE, BLOOD  DIFFERENTIAL    EKG None  Radiology CT ABDOMEN PELVIS W CONTRAST  Result Date: 03/19/2020 CLINICAL DATA:  Generalized abdominal pain. Possible small bowel obstruction. EXAM: CT ABDOMEN AND PELVIS WITH CONTRAST TECHNIQUE: Multidetector CT imaging of the abdomen and pelvis was performed using the standard protocol following bolus administration of intravenous contrast. CONTRAST:  49mL OMNIPAQUE IOHEXOL 300 MG/ML  SOLN COMPARISON:  02/08/2016 FINDINGS: Lower chest: Lung bases demonstrate bilateral peripheral mostly linear consolidation likely atelectasis and less likely infection. No effusion. Mild elevation of the left hemidiaphragm. Calcified plaque in the region of the left anterior descending coronary artery. Hepatobiliary:  Liver, gallbladder and biliary tree are normal. Pancreas: Normal. Spleen: Normal. Adrenals/Urinary Tract: Adrenal glands are normal. Kidneys are normal in size without hydronephrosis or nephrolithiasis. Multiple bilateral renal cysts are present. Ureters and bladder are normal. Stomach/Bowel: Stomach is normal. Appendix is normal. Colon is unremarkable. There are a few mildly dilated small bowel loops in the left mid to upper abdomen measuring up to 3.7 cm in diameter. Vascular/Lymphatic: Minimal calcified plaque over the abdominal aorta which is normal in caliber. No significant adenopathy. Reproductive: Prostate gland is normal in size. Fiducial markers present over the prostate likely from previous radiation treatment for prostate cancer. Other: 3 cm oval fatty thin rimmed fatty structure over the left lower quadrant just lateral to the junction of the descending to sigmoid colon likely small focus of epiploic appendagitis. Irregular heterogeneous solid mass with adjacent fat stranding over the anterior peritoneum just left of midline abutting the anterior wall of an adjacent small bowel loop. This measures 2.8 x 3.4 x 4.1 cm in AP, transverse and craniocaudal dimension. There is a fat plane between this mass and the adjacent small bowel loop which has mildly thickened wall. This likely causes of partial degree of obstruction as there are a few mildly dilated small bowel loops just proximal to this region as described previously. Patchy adjacent fluid and fluid within the adjacent small bowel mesentery. This small heterogeneous mass is indeterminate and may be due to a benign process such as omental infarction, although remain concerning for a neoplastic process such as peritoneal spread of underlying malignancy. Musculoskeletal: No focal abnormality. IMPRESSION: 1. Heterogeneous mass over the anterior peritoneum left of midline measuring 2.8 x 3.4 x 4.1 cm with adjacent fat stranding and patchy mesenteric fluid.  This likely causes a partial degree of small bowel obstruction with a few associated mildly dilated jejunal loops. Findings are indeterminate and may represent focal omental infarction versus peritoneal spread of underlying malignancy. 2. 3 cm thin rimmed oval fatty structure over the left lower  quadrant just lateral to the colon compatible with mild acute epiploic appendagitis. 3. Heterogeneous bibasilar consolidation likely atelectasis less likely infection. 4.  Bilateral renal cysts. 5. Aortic Atherosclerosis (ICD10-I70.0). Mild atherosclerotic coronary artery disease. Electronically Signed   By: Marin Olp M.D.   On: 03/19/2020 09:01   DG Abd 2 Views  Result Date: 03/18/2020 CLINICAL DATA:  Increased bloating. EXAM: ABDOMEN - 2 VIEW COMPARISON:  February 08, 2016 FINDINGS: There are dilated loops of small bowel scattered throughout the abdomen measuring up to approximately 4.8 cm. There are air-fluid levels. There are airspace opacities at the lung bases, favored to represent atelectasis. IMPRESSION: Dilated loops of small bowel concerning for small bowel obstruction or ileus. Electronically Signed   By: Constance Holster M.D.   On: 03/18/2020 19:06    Procedures Procedures (including critical care time)  Medications Ordered in ED Medications  sodium chloride flush (NS) 0.9 % injection 3 mL (has no administration in time range)  morphine 4 MG/ML injection 4 mg (4 mg Intravenous Given 03/19/20 0809)  ondansetron (ZOFRAN) injection 4 mg (4 mg Intravenous Given 03/19/20 0810)  iohexol (OMNIPAQUE) 300 MG/ML solution 75 mL (75 mLs Intravenous Contrast Given 03/19/20 0818)  sodium chloride 0.9 % bolus 1,000 mL (1,000 mLs Intravenous New Bag/Given 03/19/20 0836)    ED Course  I have reviewed the triage vital signs and the nursing notes.  Pertinent labs & imaging results that were available during my care of the patient were reviewed by me and considered in my medical decision making (see chart for  details).  Patient seen and examined. Reviewed x-rays and labs.  CT ordered.  No fevers or tachycardia to suggest sepsis.  Vital signs reviewed and are as follows: BP (!) 140/96   Pulse 75   Temp 98.9 F (37.2 C) (Oral)   Resp 21   SpO2 96%   CT reviewed personally.  Awaiting results.  10:09 AM spoke with surgery.  They will consult on patient.  Will admit to medicine.  10:32 AM Spoke with IMTS who will see patient.     MDM Rules/Calculators/A&P                          Admit.    Final Clinical Impression(s) / ED Diagnoses Final diagnoses:  Partial small bowel obstruction ([place])  Left lower quadrant abdominal pain  Acute kidney injury [facility])    Rx / DC Orders ED Discharge Orders    None       Carlisle Cater, PA-C 03/19/20 1033    Sherwood Gambler, MD 03/22/20 [phone]

## 2020-03-20 ENCOUNTER — Inpatient Hospital Stay (HOSPITAL_COMMUNITY): Payer: Medicare HMO

## 2020-03-20 DIAGNOSIS — K56600 Partial intestinal obstruction, unspecified as to cause: Secondary | ICD-10-CM | POA: Diagnosis present

## 2020-03-20 DIAGNOSIS — D72829 Elevated white blood cell count, unspecified: Secondary | ICD-10-CM

## 2020-03-20 LAB — CBC
HCT: 40.9 % (ref 39.0–52.0)
Hemoglobin: 12.9 g/dL — ABNORMAL LOW (ref 13.0–17.0)
MCH: 23.2 pg — ABNORMAL LOW (ref 26.0–34.0)
MCHC: 31.5 g/dL (ref 30.0–36.0)
MCV: 73.6 fL — ABNORMAL LOW (ref 80.0–100.0)
Platelets: 285 10*3/uL (ref 150–400)
RBC: 5.56 MIL/uL (ref 4.22–5.81)
RDW: 14.9 % (ref 11.5–15.5)
WBC: 16.7 10*3/uL — ABNORMAL HIGH (ref 4.0–10.5)
nRBC: 0 % (ref 0.0–0.2)

## 2020-03-20 LAB — BASIC METABOLIC PANEL
Anion gap: 8 (ref 5–15)
BUN: 20 mg/dL (ref 8–23)
CO2: 25 mmol/L (ref 22–32)
Calcium: 8.3 mg/dL — ABNORMAL LOW (ref 8.9–10.3)
Chloride: 102 mmol/L (ref 98–111)
Creatinine, Ser: 1.44 mg/dL — ABNORMAL HIGH (ref 0.61–1.24)
GFR calc Af Amer: 57 mL/min — ABNORMAL LOW (ref >60)
GFR calc non Af Amer: 49 mL/min — ABNORMAL LOW (ref >60)
Glucose, Bld: 150 mg/dL — ABNORMAL HIGH (ref 70–99)
Potassium: 4.1 mmol/L (ref 3.5–5.1)
Sodium: 135 mmol/L (ref 135–145)

## 2020-03-20 LAB — LACTIC ACID, PLASMA
Lactic Acid, Venous: 1.5 mmol/L (ref 0.5–1.9)
Lactic Acid, Venous: 1.4 mmol/L (ref 0.5–1.9)

## 2020-03-20 LAB — PSA: Prostatic Specific Antigen: 2.2 ng/mL (ref 0.00–4.00)

## 2020-03-20 MED ORDER — PANTOPRAZOLE SODIUM 20 MG PO TBEC
20.0000 mg | DELAYED_RELEASE_TABLET | Freq: Every day | ORAL | Status: DC
  Administered 2020-03-20 – 2020-03-23 (×4): 20 mg via ORAL
  Filled 2020-03-20 (×4): qty 1

## 2020-03-20 MED ORDER — POLYETHYLENE GLYCOL 3350 17 G PO PACK
17.0000 g | PACK | Freq: Every day | ORAL | Status: DC
  Administered 2020-03-20: 17 g via ORAL
  Filled 2020-03-20 (×2): qty 1

## 2020-03-20 MED ORDER — SENNA 8.6 MG PO TABS: 1.0000 | ORAL_TABLET | Freq: Every day | ORAL | Status: DC

## 2020-03-20 MED ORDER — SENNA 8.6 MG PO TABS
2.0000 | ORAL_TABLET | Freq: Every day | ORAL | Status: DC
  Administered 2020-03-20 – 2020-03-21 (×2): 17.2 mg via ORAL
  Filled 2020-03-20 (×4): qty 2

## 2020-03-20 MED ORDER — METRONIDAZOLE IN NACL 5-0.79 MG/ML-% IV SOLN
500.0000 mg | Freq: Three times a day (TID) | INTRAVENOUS | Status: AC
  Administered 2020-03-20 – 2020-03-22 (×9): 500 mg via INTRAVENOUS
  Filled 2020-03-20 (×9): qty 100

## 2020-03-20 MED ORDER — SODIUM CHLORIDE 0.9 % IV SOLN
1.0000 g | INTRAVENOUS | Status: AC
  Administered 2020-03-20 – 2020-03-22 (×3): 1 g via INTRAVENOUS
  Filled 2020-03-20 (×2): qty 1
  Filled 2020-03-20: qty 10

## 2020-03-20 MED ORDER — BISACODYL 10 MG RE SUPP: 10.0000 mg | Freq: Every day | RECTAL | Status: DC | PRN

## 2020-03-20 MED ORDER — PANTOPRAZOLE SODIUM 40 MG PO TBEC: 40.0000 mg | DELAYED_RELEASE_TABLET | Freq: Every day | ORAL | Status: DC

## 2020-03-20 MED ORDER — ALUM & MAG HYDROXIDE-SIMETH 200-200-20 MG/5ML PO SUSP
30.0000 mL | ORAL | Status: DC | PRN
  Administered 2020-03-20: 30 mL via ORAL
  Filled 2020-03-20: qty 30

## 2020-03-20 NOTE — Progress Notes (Signed)
  Date: 03/20/2020  Patient name: Jerry Chapman  Medical record number: [id number]  Date of birth: [birth year]   I have seen and evaluated Jerry Chapman and discussed their care with the Residency Team. Briefly, Jerry Chapman presented with abdominal pain, epigastric and lower abdominal which was toothache in nature.  Persists in the epigastrium this morning 7/10 but only with palpation.  Other issues are night sweats for a few months and decreased BM with only flatulence.  CT scan of the abdomen showed a likely omental infarction, however, was not certain and a partial SBO.  Further noted to have worsened renal function.     Vitals:   03/20/20 0621 03/20/20 1254  BP: 122/75 (!) 132/77  Pulse: 67 73  Resp: 17 16  Temp: (!) 97.4 F (36.3 C) 98.5 F (36.9 C)  SpO2: 94% 96%   General: Resting in bed, no acute distress Eyes: anicteric sclerae HENT: NCAT, MMM CV: RR, NR, no murmur, no LE edema Pulm: breathing comfortably, no wheezing Abd: Distended.  TTP in the LUQ, epigastrium.  Voluntary guarding on palpation.  MSK: Normal bulk of the muscle Skin: Chronic skin changes in the lower legs  Assessment and Plan: I have seen and evaluated the patient as outlined above. I agree with the formulated Assessment and Plan as detailed in the residents' note, with the following changes:   1. Partial SBO - Monitor for BM and vomiting - Abdominal pain still ongoing, no BM - Trial of full liquid diet - IR for possibility of biopsy mass - CEA and PSA ordered (history of prostate Ca) - Pain and nausea control  2. Elevated WBC, night sweats, h/o prostate cancer - Unclear etiology, possible colitis - Rocephin and Flagyl - Trend CBC - Check CEA, PSA  Other issue per student note.   Sid Falcon, MD 7/26/20216:19 PM

## 2020-03-20 NOTE — Progress Notes (Signed)
[facility] Progress Note     Subjective: Patient denies abdominal pain this AM. Passing flatus but not BM. Denies nausea, vomiting or worsened pain with CLD. He still feels bloated but feels like it is improving. Has not been mobilizing yet. He had a fever recorded at 101 overnight but this has since resolved.  Objective: Vital signs in last 24 hours: Temp:  [97.4 F (36.3 C)-101 F (38.3 C)] 97.4 F (36.3 C) (07/26 0621) Pulse Rate:  [60-97] 67 (07/26 0621) Resp:  [16-24] 17 (07/26 0621) BP: (122-162)/(74-96) 122/75 (07/26 0621) SpO2:  [91 %-97 %] 94 % (07/26 0621) Last BM Date: 03/18/20  Intake/Output from previous day: 07/25 0701 - 07/26 0700 In: -  Out: 1075 [Urine:1075] Intake/Output this shift: No intake/output data recorded.  PE: General: pleasant, WD, obese male who is laying in bed in NAD Heart: regular, rate, and rhythm. Palpable radial and pedal pulses bilaterally Lungs:  Respiratory effort nonlabored Abd: soft, NT, distended, +BS MS: all 4 extremities are symmetrical with no cyanosis, clubbing, or edema. Skin: warm and dry with no masses, lesions, or rashes    Lab Results:  Recent Labs    03/18/20 1954 03/20/20 0423  WBC 14.6*  14.7* 16.7*  HGB 14.0  14.1 12.9*  HCT 45.0  45.0 40.9  PLT 262  297 285   BMET Recent Labs    03/18/20 1954 03/20/20 0423  NA 132* 135  K 3.7 4.1  CL 95* 102  CO2 25 25  GLUCOSE 145* 150*  BUN 27* 20  CREATININE 1.85* 1.44*  CALCIUM 8.8* 8.3*   PT/INR No results for input(s): LABPROT, INR in the last 72 hours. CMP     Component Value Date/Time   NA 135 03/20/2020 0423   NA 140 12/08/2019 1436   K 4.1 03/20/2020 0423   CL 102 03/20/2020 0423   CO2 25 03/20/2020 0423   GLUCOSE 150 (H) 03/20/2020 0423   BUN 20 03/20/2020 0423   BUN 18 12/08/2019 1436   CREATININE 1.44 (H) 03/20/2020 0423   CREATININE 1.31 (H) 07/16/2015 1539   CALCIUM 8.3 (L) 03/20/2020 0423   PROT 7.8 03/18/2020 1954    PROT 7.8 12/08/2019 1436   ALBUMIN 3.3 (L) 03/18/2020 1954   ALBUMIN 4.7 12/08/2019 1436   AST 23 03/18/2020 1954   ALT 29 03/18/2020 1954   ALKPHOS 54 03/18/2020 1954   BILITOT 1.2 03/18/2020 1954   BILITOT 0.4 12/08/2019 1436   GFRNONAA 49 (L) 03/20/2020 0423   GFRNONAA 64 11/07/2014 1047   GFRAA 57 (L) 03/20/2020 0423   GFRAA 74 11/07/2014 1047   Lipase     Component Value Date/Time   LIPASE 25 03/18/2020 1954       Studies/Results: CT ABDOMEN PELVIS W CONTRAST  Result Date: 03/19/2020 CLINICAL DATA:  Generalized abdominal pain. Possible small bowel obstruction. EXAM: CT ABDOMEN AND PELVIS WITH CONTRAST TECHNIQUE: Multidetector CT imaging of the abdomen and pelvis was performed using the standard protocol following bolus administration of intravenous contrast. CONTRAST:  67mL OMNIPAQUE IOHEXOL 300 MG/ML  SOLN COMPARISON:  02/08/2016 FINDINGS: Lower chest: Lung bases demonstrate bilateral peripheral mostly linear consolidation likely atelectasis and less likely infection. No effusion. Mild elevation of the left hemidiaphragm. Calcified plaque in the region of the left anterior descending coronary artery. Hepatobiliary: Liver, gallbladder and biliary tree are normal. Pancreas: Normal. Spleen: Normal. Adrenals/Urinary Tract: Adrenal glands are normal. Kidneys are normal in size without hydronephrosis or nephrolithiasis. Multiple bilateral renal cysts are present.  Ureters and bladder are normal. Stomach/Bowel: Stomach is normal. Appendix is normal. Colon is unremarkable. There are a few mildly dilated small bowel loops in the left mid to upper abdomen measuring up to 3.7 cm in diameter. Vascular/Lymphatic: Minimal calcified plaque over the abdominal aorta which is normal in caliber. No significant adenopathy. Reproductive: Prostate gland is normal in size. Fiducial markers present over the prostate likely from previous radiation treatment for prostate cancer. Other: 3 cm oval fatty thin  rimmed fatty structure over the left lower quadrant just lateral to the junction of the descending to sigmoid colon likely small focus of epiploic appendagitis. Irregular heterogeneous solid mass with adjacent fat stranding over the anterior peritoneum just left of midline abutting the anterior wall of an adjacent small bowel loop. This measures 2.8 x 3.4 x 4.1 cm in AP, transverse and craniocaudal dimension. There is a fat plane between this mass and the adjacent small bowel loop which has mildly thickened wall. This likely causes of partial degree of obstruction as there are a few mildly dilated small bowel loops just proximal to this region as described previously. Patchy adjacent fluid and fluid within the adjacent small bowel mesentery. This small heterogeneous mass is indeterminate and may be due to a benign process such as omental infarction, although remain concerning for a neoplastic process such as peritoneal spread of underlying malignancy. Musculoskeletal: No focal abnormality. IMPRESSION: 1. Heterogeneous mass over the anterior peritoneum left of midline measuring 2.8 x 3.4 x 4.1 cm with adjacent fat stranding and patchy mesenteric fluid. This likely causes a partial degree of small bowel obstruction with a few associated mildly dilated jejunal loops. Findings are indeterminate and may represent focal omental infarction versus peritoneal spread of underlying malignancy. 2. 3 cm thin rimmed oval fatty structure over the left lower quadrant just lateral to the colon compatible with mild acute epiploic appendagitis. 3. Heterogeneous bibasilar consolidation likely atelectasis less likely infection. 4.  Bilateral renal cysts. 5. Aortic Atherosclerosis (ICD10-I70.0). Mild atherosclerotic coronary artery disease. Electronically Signed   By: Marin Olp M.D.   On: 03/19/2020 09:01   DG Abd 2 Views  Result Date: 03/18/2020 CLINICAL DATA:  Increased bloating. EXAM: ABDOMEN - 2 VIEW COMPARISON:  February 08, 2016 FINDINGS: There are dilated loops of small bowel scattered throughout the abdomen measuring up to approximately 4.8 cm. There are air-fluid levels. There are airspace opacities at the lung bases, favored to represent atelectasis. IMPRESSION: Dilated loops of small bowel concerning for small bowel obstruction or ileus. Electronically Signed   By: Constance Holster M.D.   On: 03/18/2020 19:06    Anti-infectives: Anti-infectives (From admission, onward)   Start     Dose/Rate Route Frequency Ordered Stop   03/20/20 0645  cefTRIAXone (ROCEPHIN) 1 g in sodium chloride 0.9 % 100 mL IVPB     Discontinue     1 g 200 mL/hr over 30 Minutes Intravenous Every 24 hours 03/20/20 0641     03/20/20 0645  metroNIDAZOLE (FLAGYL) IVPB 500 mg     Discontinue     500 mg 100 mL/hr over 60 Minutes Intravenous Every 8 hours 03/20/20 0641         Assessment/Plan AKI - Cr 1.44 from 1.8 on admit Prostate cancer -radiation therapy Arthritis Gout Hypertension Sleep apnea - tested, but never got the CPAP machine  Abdominal pain with omental infarction Ileus - patient reports he is passing flatus and tolerating CLD without nausea/vomiting or pain  - abdomen still distended but  non-tender - I think we can try advancing to FLD today but would not advance past this until patient having BMs - continue bowel regimen and mobilization today - no indication for emergent surgical intervention at this time - WBC 16 and patient did have a febrile episode overnight but now afebrile and clinically improving, continue to monitor   FEN: IV fluids/FLD ID: [email or web address] 7/26>> DVT:  SQ heparin   LOS: 1 day    Norm Parcel , [facility] 03/20/2020, 7:48 AM Please see Amion for pager number during day hours 7:00am-4:30pm

## 2020-03-20 NOTE — Progress Notes (Signed)
Subjective:  Hospital Day: 1  Overnight Events: Pt reported feeling hot and sweaty with a fever of 101 last night, afebrile since then following tylenol around 3 AM.  Patient examined at bedside. He states that he feels "great" but still has 7/10 abdominal pain, improved from time of admission. Has not had a BM yet. Denies n/v. Also endorses some epigastric pain that sounds like acid reflux, but states he has not had acid reflux since the 1970s.  He reports annual follow up for his hx of prostate CA, which was treated with radioactive seed implants. He states that he had a small PSA elevation last time, but within normal limits. His urine was noted to be red this morning, and he states this is the first time that it has happened. Has had night sweats for many years. Denies ever smoking.  Objective: Vital signs in last 24 hours: Vitals:   03/19/20 2101 03/19/20 2125 03/20/20 0104 03/20/20 0621  BP: (!) 135/77 (!) 162/87 128/80 122/75  Pulse: 60 74 97 67  Resp: 16 17 19 17   Temp: 98.2 F (36.8 C) 99.6 F (37.6 C) (!) 101 F (38.3 C) (!) 97.4 F (36.3 C)  TempSrc:  Oral Oral Oral  SpO2: 95% 97% 91% 94%   Weight change:   Intake/Output Summary (Last 24 hours) at 03/20/2020 1013 Last data filed at 03/19/2020 2300 Gross per 24 hour  Intake --  Output 1075 ml  Net -1075 ml   Physical Exam Vitals and nursing note reviewed.  Constitutional: no acute distress Cardiovascular: regular rate and rhythm, normal heart sounds Pulmonary: effort normal, right sided lower lung crackles Abdominal: distended, TTP in epigastrium, involuntary guarding, bowel sounds normal Musculoskeletal: no peripheral edema Skin: warm and dry Neurological: alert, no focal deficit Psychiatric: normal mood and affect  Lab Results: CBC Latest Ref Rng & Units 03/20/2020 03/18/2020 03/18/2020  WBC 4.0 - 10.5 K/uL 16.7(H) 14.6(H) 14.7(H)  Hemoglobin 13.0 - 17.0 g/dL 12.9(L) 14.0 14.1  Hematocrit 39 - 52 % 40.9 45.0  45.0  Platelets 150 - 400 K/uL 285 262 297   CMP Latest Ref Rng & Units 03/20/2020 03/18/2020 12/08/2019  Glucose 70 - 99 mg/dL 150(H) 145(H) 93  BUN 8 - 23 mg/dL 20 27(H) 18  Creatinine 0.61 - 1.24 mg/dL 1.44(H) 1.85(H) 1.10  Sodium 135 - 145 mmol/L 135 132(L) 140  Potassium 3.5 - 5.1 mmol/L 4.1 3.7 4.4  Chloride 98 - 111 mmol/L 102 95(L) 100  CO2 22 - 32 mmol/L 25 25 23   Calcium 8.9 - 10.3 mg/dL 8.3(L) 8.8(L) 9.7  Total Protein 6.5 - 8.1 g/dL - 7.8 7.8  Total Bilirubin 0.3 - 1.2 mg/dL - 1.2 0.4  Alkaline Phos 38 - 126 U/L - 54 73  AST 15 - 41 U/L - 23 54(H)  ALT 0 - 44 U/L - 29 41   Lactic Acid: 1.4 PSA: 2.2  Studies/Results: DG CHEST PORT 1 VIEW IMPRESSION: No acute findings. Electronically Signed   By: Lorin Picket M.D.   On: 03/20/2020 07:57   Assessment/Plan: Principal Problem:   Small bowel obstruction (HCC) Active Problems:   HTN (hypertension)   Adenocarcinoma of prostate ([place])   AKI (acute kidney injury) ([place])   Abdominal mass   Microscopic hematuria  Mr. Noecker is a 69 year old male with PMH of prostate cancer, HTN and gout here for management for Partial SBO.   #Partial SBO: Abdominal pain likely secondary to SBO vs omental infarct. SBO secondary to peritoneal mass  discovered on CT abdomen and pelvis. Mass measures 2.8 x 3.4 x 4.1 cm. It is reassuring that patient is passing flatus. Has not had a bowel movement since admission day. Will continue to monitor and provide bowel regimen. Progress diet to full liquid. Will hold off on NGT placement for now but low threshold to place NGT if patient begins to vomit. Surgery was consulted and do not recommend operation at this time. Consult IR for diagnostic biopsy of omental mass. Start PPI for GERD-like epigastric complaints. - consult IR for abdominal mass biopsy - Protonix 20 mg qd - progress to full liquid diet - NG tube if patient starts to vomit - IVF with LR at 125 mL/hr - Pain regimen: Dilaudid 0.5mg   q4hrs prn - Zofran PRN for nausea - Bowel regimen with Dulcolax and Miralax  #AKI: FeNa 0.3%, consistent with pre-renal. sCr 1.44, down from 1.85 at admission (Baseline 1.1) - IVF LR 125 mL/hr  - BMP qd  #Leukocytosis, unknown etiology: WBC 14.7->16.7, pt initially afebrile but spiked a fever to 101 last night. Concerning for possible infection. UA with few bacteria, negative for leuks or nitrites. Unconvincing for UTI.  CXR clear, lactic acid negative. Will follow up on blood cultures. Start ceftriaxone and metronidazole. Unlikely but possibly due to stercoral colitis. Pt continues to have flatulence but does have epigastric TTP and dilated bowel on CT. Will monitor and provide bowel regimen and pain management. It is possible this is a reactive leukocytosis and fever from his epiploic appendigitis. - f/u blood culture -start Ceftriaxone 1g qd and Flagyl 500 mg q8h, Day1 - CBC AM  #Hx of prostate cancer: s/p Radiation therapy in 2006. Currently under surveillance q 6 months. PSA has increased from 0.44 in 03/08 to 2.4 in 08/20. Experienced two back-to-back instances of gross hematuria in 2017 with negative workup. PSA today at 2.2. Urine was red this morning which pt reports is new to him but that he had cranberry juice last night. Pt to f/u with urologist at earliest convenience. -f/u urologist, possible outpatient cystoscopy  #HTN: - Hold home amlodipine for now   #Gout: - Hold allopurinol, naproxen and colchicine in the setting of AKI  #OSA - CPAP at night  This is a Careers information officer Note.  The care of the patient was discussed with Dr. Alfonse Spruce and the assessment and plan formulated with their assistance.  Please see their attached note for official documentation of the daily encounter.   LOS: 1 day   Calvert Cantor, Medical Student 03/20/2020, 10:13 AM

## 2020-03-20 NOTE — Hospital Course (Addendum)
Partial SBO Jerry Chapman is a 69 year old male with PMH of prostate cancer, HTN and gout who was present to the hospital for increasing abdominal pain and distention.  Abdominal pain likely secondary to ileus vs partial SBO . SBO secondary to peritoneal mass discovered on CT abdomen and pelvis. Mass measures 2.8 x 3.4 x 4.1 cm which was likely omental infarct vs malignancy.  CEA tumor marker was obtained and came back normal.  NG tube was not placed on admission because of the absence of nausea vomiting.  Clear liquid diet was started and advanced to full liquid diet the next day.  Protonix was started.  Bowel regimen of MiraLAX and Senokot were in place.  Patient only had a small bowel movements on the second day and abdominal distention was not improved. NG tube was then placed to decompress the bowels and proceeded with Gastrografin test which shows partial small bowel obstruction.  NG tube was kept for 1 day and liquid diet was started.  Patient tolerated the diet well.  Leukocytosis White blood count on admission was 14.7 which increased to 16.7 the next day.  Patient also had a fever of 101 the morning after admission.  Lactic acid obtain and came back normal at 1.5.  Chest x-ray was unremarkable.  Blood culture was obtained. The source of leukocytosis could be stercoral colitis versus epiploic appendagitis. IV ceftriaxone and metronidazole were started for 3 days and finished.  Patient was afebrile and clinically well.  AKI His creatinine on admission was 1.5.  Creatinine baseline was 1.1.  FeNa calculated was 0.3% which is consistent with prerenal secondary to decreased p.o. intake.  IV fluid was started with improvement of creatinine.  History of prostate cancer Hematuria Patient received seed therapy in 2006 and currently under surveillance every 6 months.  The last PSA was in August 2020 which which was 2.4.  Given his small hemoglobin in UA, PSA was repeated and was 2.2.  Patient will  continue to follow-up with his urologist after discharge.

## 2020-03-20 NOTE — Progress Notes (Addendum)
  CT yesterday: IMPRESSION: 1. Heterogeneous mass over the anterior peritoneum left of midline measuring 2.8 x 3.4 x 4.1 cm with adjacent fat stranding and patchy mesenteric fluid. This likely causes a partial degree of small bowel obstruction with a few associated mildly dilated jejunal loops. Findings are indeterminate and may represent focal omental infarction versus peritoneal spread of underlying malignancy. 2. 3 cm thin rimmed oval fatty structure over the left lower quadrant just lateral to the colon compatible with mild acute epiploic appendagitis.   IR received request for abd mass bx Dr Earleen Newport has reviewed imaging and feels likely omental infarct--- would not biopsy this lesion. Suggests PSA and CEA levels  [facility] note: Dr Ninfa Linden-- recommending re CT and/or OR for laparotomy Dr Earleen Newport does agree with this recommendation  Will cancel bx  chat message to Dr Daryll Drown and Dr Sharon Seller

## 2020-03-21 ENCOUNTER — Inpatient Hospital Stay (HOSPITAL_COMMUNITY): Payer: Medicare HMO

## 2020-03-21 DIAGNOSIS — R3129 Other microscopic hematuria: Secondary | ICD-10-CM

## 2020-03-21 DIAGNOSIS — M109 Gout, unspecified: Secondary | ICD-10-CM

## 2020-03-21 DIAGNOSIS — N179 Acute kidney failure, unspecified: Secondary | ICD-10-CM

## 2020-03-21 DIAGNOSIS — C61 Malignant neoplasm of prostate: Secondary | ICD-10-CM

## 2020-03-21 DIAGNOSIS — R1909 Other intra-abdominal and pelvic swelling, mass and lump: Secondary | ICD-10-CM

## 2020-03-21 DIAGNOSIS — I10 Essential (primary) hypertension: Secondary | ICD-10-CM

## 2020-03-21 DIAGNOSIS — Z9989 Dependence on other enabling machines and devices: Secondary | ICD-10-CM

## 2020-03-21 LAB — BASIC METABOLIC PANEL
Anion gap: 9 (ref 5–15)
BUN: 17 mg/dL (ref 8–23)
CO2: 27 mmol/L (ref 22–32)
Calcium: 8.4 mg/dL — ABNORMAL LOW (ref 8.9–10.3)
Chloride: 103 mmol/L (ref 98–111)
Creatinine, Ser: 1.36 mg/dL — ABNORMAL HIGH (ref 0.61–1.24)
GFR calc Af Amer: >60 mL/min (ref >60)
GFR calc non Af Amer: 53 mL/min — ABNORMAL LOW (ref >60)
Glucose, Bld: 99 mg/dL (ref 70–99)
Potassium: 4.2 mmol/L (ref 3.5–5.1)
Sodium: 139 mmol/L (ref 135–145)

## 2020-03-21 LAB — CEA: CEA: 1.1 ng/mL (ref 0.0–4.7)

## 2020-03-21 LAB — CBC
HCT: 40.7 % (ref 39.0–52.0)
Hemoglobin: 12.9 g/dL — ABNORMAL LOW (ref 13.0–17.0)
MCH: 23.5 pg — ABNORMAL LOW (ref 26.0–34.0)
MCHC: 31.7 g/dL (ref 30.0–36.0)
MCV: 74 fL — ABNORMAL LOW (ref 80.0–100.0)
Platelets: 349 10*3/uL (ref 150–400)
RBC: 5.5 MIL/uL (ref 4.22–5.81)
RDW: 14.9 % (ref 11.5–15.5)
WBC: 13.6 10*3/uL — ABNORMAL HIGH (ref 4.0–10.5)
nRBC: 0 % (ref 0.0–0.2)

## 2020-03-21 MED ORDER — DIATRIZOATE MEGLUMINE & SODIUM 66-10 % PO SOLN
90.0000 mL | Freq: Once | ORAL | Status: AC
  Administered 2020-03-21: 90 mL via NASOGASTRIC
  Filled 2020-03-21: qty 90

## 2020-03-21 MED ORDER — LACTATED RINGERS IV SOLN: INTRAVENOUS | Status: AC

## 2020-03-21 MED ORDER — PHENOL 1.4 % MT LIQD
1.0000 | OROMUCOSAL | Status: DC | PRN
  Filled 2020-03-21: qty 177

## 2020-03-21 NOTE — Progress Notes (Signed)
[facility] Progress Note     Subjective: Patient reports feeling full after breakfast yesterday and did not take much else in. Still passing flatus but no BM. More nauseated this AM, has not vomited. He has a family member who works at [employer] and is a PA and was asking about an NGT. He has been ambulating. He reports some pain periumbilically.   Objective: Vital signs in last 24 hours: Temp:  [98.5 F (36.9 C)-98.8 F (37.1 C)] 98.8 F (37.1 C) (07/27 0442) Pulse Rate:  [66-73] 66 (07/27 0442) Resp:  [15-18] 18 (07/27 0442) BP: (132-141)/(75-91) 141/91 (07/27 0442) SpO2:  [94 %-96 %] 94 % (07/27 0442) Last BM Date: 03/18/20  Intake/Output from previous day: 07/26 0701 - 07/27 0700 In: 1585 [P.O.:1285; IV Piggyback:300] Out: 1175 [Urine:1175] Intake/Output this shift: No intake/output data recorded.  PE: General: pleasant, WD, obese male who is laying in bed in NAD Heart: regular, rate, and rhythm. Palpable radial and pedal pulses bilaterally Lungs:  Respiratory effort nonlabored Abd: soft, NT, distended, +BS MS: all 4 extremities are symmetrical with no cyanosis, clubbing, or edema. Skin: warm and dry with no masses, lesions, or rashes   Lab Results:  Recent Labs    03/20/20 0423 03/21/20 0704  WBC 16.7* 13.6*  HGB 12.9* 12.9*  HCT 40.9 40.7  PLT 285 349   BMET Recent Labs    03/20/20 0423 03/21/20 0704  NA 135 139  K 4.1 4.2  CL 102 103  CO2 25 27  GLUCOSE 150* 99  BUN 20 17  CREATININE 1.44* 1.36*  CALCIUM 8.3* 8.4*   PT/INR No results for input(s): LABPROT, INR in the last 72 hours. CMP     Component Value Date/Time   NA 139 03/21/2020 0704   NA 140 12/08/2019 1436   K 4.2 03/21/2020 0704   CL 103 03/21/2020 0704   CO2 27 03/21/2020 0704   GLUCOSE 99 03/21/2020 0704   BUN 17 03/21/2020 0704   BUN 18 12/08/2019 1436   CREATININE 1.36 (H) 03/21/2020 0704   CREATININE 1.31 (H) 07/16/2015 1539   CALCIUM 8.4 (L) 03/21/2020 0704    PROT 7.8 03/18/2020 1954   PROT 7.8 12/08/2019 1436   ALBUMIN 3.3 (L) 03/18/2020 1954   ALBUMIN 4.7 12/08/2019 1436   AST 23 03/18/2020 1954   ALT 29 03/18/2020 1954   ALKPHOS 54 03/18/2020 1954   BILITOT 1.2 03/18/2020 1954   BILITOT 0.4 12/08/2019 1436   GFRNONAA 53 (L) 03/21/2020 0704   GFRNONAA 64 11/07/2014 1047   GFRAA >60 03/21/2020 0704   GFRAA 74 11/07/2014 1047   Lipase     Component Value Date/Time   LIPASE 25 03/18/2020 1954       Studies/Results: DG CHEST PORT 1 VIEW  Result Date: 03/20/2020 CLINICAL DATA:  Fever, cough. EXAM: PORTABLE CHEST 1 VIEW COMPARISON:  None. FINDINGS: Trachea is midline. Heart is enlarged. There is volume loss at the base of the left hemithorax, adjacent to an elevated left hemidiaphragm. Lungs are otherwise clear. No pleural fluid. IMPRESSION: No acute findings. Electronically Signed   By: Lorin Picket M.D.   On: 03/20/2020 07:57   DG Abd Portable 1V  Result Date: 03/21/2020 CLINICAL DATA:  Abdominal pain and constipation for 4 days, history ileus EXAM: PORTABLE ABDOMEN - 1 VIEW COMPARISON:  Portable exam 0824 hours compared to 03/18/2020 FINDINGS: Persistent gaseous distension of small bowel loops throughout the abdomen with paucity of colonic gas either representing persistent small bowel  ileus or obstruction. Small amount gas within stomach. Osseous structures unremarkable. IMPRESSION: Persistent gaseous distension small bowel question small-bowel obstruction or ileus. Electronically Signed   By: Lavonia Dana M.D.   On: 03/21/2020 08:31    Anti-infectives: Anti-infectives (From admission, onward)   Start     Dose/Rate Route Frequency Ordered Stop   03/20/20 0645  cefTRIAXone (ROCEPHIN) 1 g in sodium chloride 0.9 % 100 mL IVPB     Discontinue     1 g 200 mL/hr over 30 Minutes Intravenous Every 24 hours 03/20/20 0641     03/20/20 0645  metroNIDAZOLE (FLAGYL) IVPB 500 mg     Discontinue     500 mg 100 mL/hr over 60 Minutes Intravenous  Every 8 hours 03/20/20 0641         Assessment/Plan AKI - Cr 1.36 from 1.8 on admit Prostate cancer-radiation therapy Arthritis Gout Hypertension Sleep apnea - tested, but never got the CPAP machine  Abdominal pain with omental infarction Ileus - patient reports he is passing flatus but feels full and mildly nauseated - abdomen still distended but non-tender on exam  - KUB this AM with dilated small bowel - insert NGT and try small bowel protocol  - if not improving with this, will repeat CT A/P - no emergent surgical intervention needed at this time  FEN:IV fluids/NPO ID: [id number]/flagyl 7/26>> DVT: SQ heparin   LOS: 2 days    Norm Parcel , [facility] 03/21/2020, 8:40 AM Please see Amion for pager number during day hours 7:00am-4:30pm

## 2020-03-21 NOTE — Progress Notes (Addendum)
Subjective:  Hospital Day: 2  No overnight events.  Mr. Grayer was evaluated at bedside this morning. Notes that he is feeling well with no abdominal pain at rest, n/v, chest pain, or shortness of breath. He had a BM this morning but says that it was very small and hard. He reports still feeling very full and has not eaten anything since his breakfast yesterday morning. We discussed, with surgery present, the placement of an NG tube today followed by contrast imaging of his abdomen this afternoon. Pt expressed understanding. He was left lying comfortably in his bed.  Objective: Vital signs in last 24 hours: Vitals:   03/20/20 0621 03/20/20 1254 03/20/20 2031 03/21/20 0442  BP: 122/75 (!) 132/77 (!) 133/75 (!) 141/91  Pulse: 67 73 72 66  Resp: 17 16 15 18   Temp: (!) 97.4 F (36.3 C) 98.5 F (36.9 C) 98.6 F (37 C) 98.8 F (37.1 C)  TempSrc: Oral Oral Oral Oral  SpO2: 94% 96% 94% 94%   Weight change:   Intake/Output Summary (Last 24 hours) at 03/21/2020 1029 Last data filed at 03/21/2020 0554 Gross per 24 hour  Intake 1210 ml  Output 1175 ml  Net 35 ml   Physical Exam Vitals and nursing note reviewed.  Constitutional: no acute distress Cardiovascular: regular rate and rhythm, normal heart sounds Pulmonary: effort normal, right sided lower lung crackles Abdominal: distended, TTP in epigastrium, decreased bowel sounds Musculoskeletal: no peripheral edema Skin: warm and dry Neurological: alert, no focal deficit Psychiatric: normal mood and affect  Lab Results: CBC Latest Ref Rng & Units 03/21/2020 03/20/2020 03/18/2020  WBC 4.0 - 10.5 K/uL 13.6(H) 16.7(H) 14.6(H)  Hemoglobin 13.0 - 17.0 g/dL 12.9(L) 12.9(L) 14.0  Hematocrit 39 - 52 % 40.7 40.9 45.0  Platelets 150 - 400 K/uL 349 285 262   CMP Latest Ref Rng & Units 03/21/2020 03/20/2020 03/18/2020  Glucose 70 - 99 mg/dL 99 150(H) 145(H)  BUN 8 - 23 mg/dL 17 20 27(H)  Creatinine 0.61 - 1.24 mg/dL 1.36(H) 1.44(H) 1.85(H)    Sodium 135 - 145 mmol/L 139 135 132(L)  Potassium 3.5 - 5.1 mmol/L 4.2 4.1 3.7  Chloride 98 - 111 mmol/L 103 102 95(L)  CO2 22 - 32 mmol/L 27 25 25   Calcium 8.9 - 10.3 mg/dL 8.4(L) 8.3(L) 8.8(L)  Total Protein 6.5 - 8.1 g/dL - - 7.8  Total Bilirubin 0.3 - 1.2 mg/dL - - 1.2  Alkaline Phos 38 - 126 U/L - - 54  AST 15 - 41 U/L - - 23  ALT 0 - 44 U/L - - 29   PSA: 2.2 CEA: 1.1  Studies/Results: DG Abd Portable 1V IMPRESSION: Persistent gaseous distension small bowel question small-bowel obstruction or ileus. Electronically Signed   By: Lavonia Dana M.D.   On: 03/21/2020 08:31   Assessment/Plan: Principal Problem:   Partial small bowel obstruction w/ ileus Active Problems:   HTN (hypertension)   Adenocarcinoma of prostate ([place])   Acute kidney injury ([place])   Abdominal mass   Microscopic hematuria  Jerry Chapman is a 69 year old male with PMH of prostate cancer,HTN and gout here for management for Partial SBO. Pt continues to have abdominal distension, decreased appetite, and no significant BM since admission day.   #SBO with Ileus 2/2 Omental Infarct vs Paraneoplastic:CT imaging showed left anterior peritoneal mass and suspected partial SBO with dilation of jejunum. Low pitch, hypoactive bowel sounds on exam today with no significant bowel movement for 2 days. Unclear at this  time whether his abdominal mass is due to omental infarct vs paraneoplastic process. Mass measures2.8 x 3.4 x 4.1 cm. It is reassuring that patient is passing flatus. Progress diet to full liquid. Surgery and radiology favor omental infarct at this time, as such IR recommends against biopsy. Pt has had a small bowel movement and flatus but has been unable to eat since yesterday morning. Still denies n/v. Surgery recommends NG tube placement to drain stomach and hopefully decompress bowel in preparation for gastrografin test this afternoon. - NG tube placed with 200 cc fluid drained immediately - Gastrografin  imaging today - Protonix 20 mg qd - progress to full liquid diet - IVF with LR at 125 mL/hr - Pain regimen: Dilaudid 0.5mg  q4hrs prn - Zofran PRN for nausea - Bowel regimen with Dulcolax and Miralax, increase as needed   #AKI:FeNa 0.3%, consistent withpre-renal. sCr 1.36, down from 1.85 at admission (Baseline 1.1) - IVF LR 125 mL/hr - BMP qd  #Leukocytosis, unknown etiology: Improving, WBC 16.7->13.6. Pt initially afebrile but spiked a fever to 101 on 7/25. Concerning for possible infection. UA with few bacteria, negative for leuks or nitrites. No s/sxs convincing of UTI.  CXR clear, lactic acid negative. Blood culture negative x 1 days. Cont ceftriaxone and metronidazole for 3 days total. Unlikely but possibly due to stercoral colitis. Pt continues to have flatulence but does have epigastric TTP and dilated bowel on CT and abdominal x-ray. Will monitor and provide bowel regimen and pain management. It is possible this is a reactive leukocytosis and fever from his epiploic appendigitis. - f/u blood culture -cont Ceftriaxone 1g qd and Flagyl 500 mg q8h, Day 2/3 - CBC AM  #Hx of prostate cancer: s/p Radiation therapy in 2006. Currently under surveillance q 6 months.PSA has increased from 0.44 in 03/08 to 2.4 in 08/20. Experienced two back-to-back instances of gross hematuria in 2017 with negative workup. PSA at 2.2. CEA at 1.1. Urine was red yesterday which pt reports is new to him but that he had cranberry juice the night prior. Pt to f/u with urologist at earliest convenience. -f/u urologist, possible outpatient cystoscopy  #HTN: - Hold home amlodipine for now  #Gout: - Hold allopurinol, naproxen and colchicine in the setting of AKI  #OSA - CPAP at night  This is a Careers information officer Note.  The care of the patient was discussed with Dr. Alfonse Spruce and the assessment and plan formulated with their assistance.  Please see their attached note for official documentation of the daily  encounter.   LOS: 2 days   Calvert Cantor, Medical Student 03/21/2020, 10:29 AM

## 2020-03-22 LAB — CBC
HCT: 40.9 % (ref 39.0–52.0)
Hemoglobin: 13 g/dL (ref 13.0–17.0)
MCH: 23.1 pg — ABNORMAL LOW (ref 26.0–34.0)
MCHC: 31.8 g/dL (ref 30.0–36.0)
MCV: 72.8 fL — ABNORMAL LOW (ref 80.0–100.0)
Platelets: 373 10*3/uL (ref 150–400)
RBC: 5.62 MIL/uL (ref 4.22–5.81)
RDW: 15 % (ref 11.5–15.5)
WBC: 13.9 10*3/uL — ABNORMAL HIGH (ref 4.0–10.5)
nRBC: 0 % (ref 0.0–0.2)

## 2020-03-22 LAB — OCCULT BLOOD X 1 CARD TO LAB, STOOL: Fecal Occult Bld: NEGATIVE

## 2020-03-22 LAB — BASIC METABOLIC PANEL
Anion gap: 10 (ref 5–15)
BUN: 16 mg/dL (ref 8–23)
CO2: 26 mmol/L (ref 22–32)
Calcium: 8.6 mg/dL — ABNORMAL LOW (ref 8.9–10.3)
Chloride: 104 mmol/L (ref 98–111)
Creatinine, Ser: 1.19 mg/dL (ref 0.61–1.24)
GFR calc Af Amer: >60 mL/min (ref >60)
GFR calc non Af Amer: >60 mL/min (ref >60)
Glucose, Bld: 95 mg/dL (ref 70–99)
Potassium: 4.3 mmol/L (ref 3.5–5.1)
Sodium: 140 mmol/L (ref 135–145)

## 2020-03-22 MED ORDER — AMLODIPINE BESYLATE 10 MG PO TABS
10.0000 mg | ORAL_TABLET | Freq: Every day | ORAL | Status: DC
  Administered 2020-03-22 – 2020-03-30 (×8): 10 mg via ORAL
  Filled 2020-03-22 (×8): qty 1

## 2020-03-22 NOTE — Progress Notes (Signed)
[facility] Progress Note     Subjective: Patient reports feeling much better after NGT was placed yesterday. He has had 2 BM and is passing flatus. He has been ambulating.   Objective: Vital signs in last 24 hours: Temp:  [98.1 F (36.7 C)-99.2 F (37.3 C)] 98.5 F (36.9 C) (07/28 0417) Pulse Rate:  [63-67] 63 (07/28 0417) Resp:  [17-18] 18 (07/28 0417) BP: (141-148)/(81-101) 148/88 (07/28 0417) SpO2:  [95 %-97 %] 95 % (07/28 0417) Last BM Date: 03/18/20  Intake/Output from previous day: 07/27 0701 - 07/28 0700 In: 2208.4 [I.V.:1490.8; NG/GT:135; IV Piggyback:582.6] Out: 650 [Emesis/NG output:650] Intake/Output this shift: Total I/O In: -  Out: 575 [Urine:575]  PE: General: pleasant, WD,obesemale who is laying in bed in NAD Heart: regular, rate, and rhythm. Palpable radial and pedal pulses bilaterally Lungs: Respiratory effort nonlabored Abd: soft, NT,less distended, +BS, NGT present with thin bilious drainage MS: all 4 extremities are symmetrical with no cyanosis, clubbing, or edema. Skin: warm and dry with no masses, lesions, or rashes   Lab Results:  Recent Labs    03/21/20 0704 03/22/20 0523  WBC 13.6* 13.9*  HGB 12.9* 13.0  HCT 40.7 40.9  PLT 349 373   BMET Recent Labs    03/21/20 0704 03/22/20 0523  NA 139 140  K 4.2 4.3  CL 103 104  CO2 27 26  GLUCOSE 99 95  BUN 17 16  CREATININE 1.36* 1.19  CALCIUM 8.4* 8.6*   PT/INR No results for input(s): LABPROT, INR in the last 72 hours. CMP     Component Value Date/Time   NA 140 03/22/2020 0523   NA 140 12/08/2019 1436   K 4.3 03/22/2020 0523   CL 104 03/22/2020 0523   CO2 26 03/22/2020 0523   GLUCOSE 95 03/22/2020 0523   BUN 16 03/22/2020 0523   BUN 18 12/08/2019 1436   CREATININE 1.19 03/22/2020 0523   CREATININE 1.31 (H) 07/16/2015 1539   CALCIUM 8.6 (L) 03/22/2020 0523   PROT 7.8 03/18/2020 1954   PROT 7.8 12/08/2019 1436   ALBUMIN 3.3 (L) 03/18/2020 1954   ALBUMIN 4.7  12/08/2019 1436   AST 23 03/18/2020 1954   ALT 29 03/18/2020 1954   ALKPHOS 54 03/18/2020 1954   BILITOT 1.2 03/18/2020 1954   BILITOT 0.4 12/08/2019 1436   GFRNONAA >60 03/22/2020 0523   GFRNONAA 64 11/07/2014 1047   GFRAA >60 03/22/2020 0523   GFRAA 74 11/07/2014 1047   Lipase     Component Value Date/Time   LIPASE 25 03/18/2020 1954       Studies/Results: DG Abd Portable 1V-Small Bowel Obstruction Protocol-initial, 8 hr delay  Result Date: 03/21/2020 CLINICAL DATA:  Follow up small bowel obstruction EXAM: PORTABLE ABDOMEN - 1 VIEW COMPARISON:  Film from earlier in the same day. FINDINGS: Gastric catheter is again noted in the stomach. Scattered large and small bowel gas is noted. Previously administered contrast is now seen within the colon. Mild small bowel dilatation is again noted in the left mid abdomen similar to that seen on prior CT examination. No free air is seen. IMPRESSION: Contrast has passed to the colon consistent with a partial small bowel obstruction. The small bowel dilatation on the left is stable from the prior CT. Electronically Signed   By: Inez Catalina M.D.   On: 03/21/2020 22:38   DG Abd Portable 1V-Small Bowel Protocol-Position Verification  Result Date: 03/21/2020 CLINICAL DATA:  Nasogastric tube placement EXAM: PORTABLE ABDOMEN - 1 VIEW  COMPARISON:  CT abdomen and pelvis March 19, 2020; abdominal radiograph March 21, 2020 obtained earlier in the day FINDINGS: Nasogastric tube tip and side port are in the stomach. Multiple loops of dilated bowel remain. No free air. There is atelectatic change in the left lung base. IMPRESSION: Nasogastric tube tip and side port in stomach. Bowel dilatation concerning for persistent bowel obstruction remains. No free air evident. Left base atelectasis. Electronically Signed   By: Lowella Grip III M.D.   On: 03/21/2020 10:55   DG Abd Portable 1V  Result Date: 03/21/2020 CLINICAL DATA:  Abdominal pain and constipation for 4  days, history ileus EXAM: PORTABLE ABDOMEN - 1 VIEW COMPARISON:  Portable exam 0824 hours compared to 03/18/2020 FINDINGS: Persistent gaseous distension of small bowel loops throughout the abdomen with paucity of colonic gas either representing persistent small bowel ileus or obstruction. Small amount gas within stomach. Osseous structures unremarkable. IMPRESSION: Persistent gaseous distension small bowel question small-bowel obstruction or ileus. Electronically Signed   By: Lavonia Dana M.D.   On: 03/21/2020 08:31    Anti-infectives: Anti-infectives (From admission, onward)   Start     Dose/Rate Route Frequency Ordered Stop   03/20/20 0645  cefTRIAXone (ROCEPHIN) 1 g in sodium chloride 0.9 % 100 mL IVPB     Discontinue     1 g 200 mL/hr over 30 Minutes Intravenous Every 24 hours 03/20/20 0641     03/20/20 0645  metroNIDAZOLE (FLAGYL) IVPB 500 mg     Discontinue     500 mg 100 mL/hr over 60 Minutes Intravenous Every 8 hours 03/20/20 0641         Assessment/Plan AKI- Cr improving  Prostate cancer-radiation therapy Arthritis Gout Hypertension Sleep apnea - tested, but never got the CPAP machine  Abdominal pain with omental infarction Ileus - NGT placed yesterday and small bowel protocol initiated - 8h film with contrast in colon but still dilated small bowel - patient reports 2 large BMs - clamp NGT and allow CLD - if tolerating this later today d/c NGT  FEN:IV fluids/CLD, NGT clamped BJ:SEGBTDVV/OHYWVP 7/26>> DVT:SQ heparin  LOS: 3 days    Norm Parcel , [facility] 03/22/2020, 7:56 AM Please see Amion for pager number during day hours 7:00am-4:30pm

## 2020-03-22 NOTE — Progress Notes (Signed)
Subjective:  Hospital Day: 3  No overnight events.  Patient reports 2 large liquidy bowel movements this morning and states he felt better afterwards. Notes that his stool was black. Tolerated clear liquids for breakfast and reports some "tightness" in his abdomen, but improved compared to after breakfast previously. He reports that the tenderness in his abdomen has improved as well to a 2/10 on palpation. Denies any new symptoms.  Recent colonoscopy in December 2020 with 32mm polyp and multiple diverticula.   Objective: Vital signs in last 24 hours: Vitals:   03/21/20 0442 03/21/20 1400 03/21/20 2034 03/22/20 0417  BP: (!) 141/91 (!) 143/101 (!) 141/81 (!) 148/88  Pulse: 66 66 67 63  Resp: 18 18 17 18   Temp: 98.8 F (37.1 C) 98.1 F (36.7 C) 99.2 F (37.3 C) 98.5 F (36.9 C)  TempSrc: Oral Oral Oral Oral  SpO2: 94% 97% 96% 95%   Weight change:   Intake/Output Summary (Last 24 hours) at 03/22/2020 0815 Last data filed at 03/22/2020 0726 Gross per 24 hour  Intake 2208.38 ml  Output 1225 ml  Net 983.38 ml   Physical Exam Vitals and nursing note reviewed.  Constitutional: no acute distress Cardiovascular: regular rate and rhythm, normal heart sounds Pulmonary: effort normal,right sided lower lung crackles Abdominal:distended,TTP in epigastrium and lower abdomen, normoactive bowel sounds Musculoskeletal:no peripheral edema Skin: warm and dry Neurological: alert, no focal deficit Psychiatric: normal mood and affect  Lab Results: CBC Latest Ref Rng & Units 03/22/2020 03/21/2020 03/20/2020  WBC 4.0 - 10.5 K/uL 13.9(H) 13.6(H) 16.7(H)  Hemoglobin 13.0 - 17.0 g/dL 13.0 12.9(L) 12.9(L)  Hematocrit 39 - 52 % 40.9 40.7 40.9  Platelets 150 - 400 K/uL 373 349 285   CMP Latest Ref Rng & Units 03/22/2020 03/21/2020 03/20/2020  Glucose 70 - 99 mg/dL 95 99 150(H)  BUN 8 - 23 mg/dL 16 17 20   Creatinine 0.61 - 1.24 mg/dL 1.19 1.36(H) 1.44(H)  Sodium 135 - 145 mmol/L 140 139 135    Potassium 3.5 - 5.1 mmol/L 4.3 4.2 4.1  Chloride 98 - 111 mmol/L 104 103 102  CO2 22 - 32 mmol/L 26 27 25   Calcium 8.9 - 10.3 mg/dL 8.6(L) 8.4(L) 8.3(L)  Total Protein 6.5 - 8.1 g/dL - - -  Total Bilirubin 0.3 - 1.2 mg/dL - - -  Alkaline Phos 38 - 126 U/L - - -  AST 15 - 41 U/L - - -  ALT 0 - 44 U/L - - -    Studies/Results: DG Abd Portable 1V-Small Bowel Obstruction Protocol-initial, 8 hr delay IMPRESSION: Contrast has passed to the colon consistent with a partial small bowel obstruction. The small bowel dilatation on the left is stable from the prior CT. Electronically Signed   By: Inez Catalina M.D.   On: 03/21/2020 22:38   Assessment/Plan: Principal Problem:   Partial small bowel obstruction w/ ileus Active Problems:   HTN (hypertension)   Adenocarcinoma of prostate ([place])   Acute kidney injury ([place])   Abdominal mass   Microscopic hematuria  Jerry Chapman is a 69 year old male with PMH of prostate cancer,HTN and gout here for management for Partial SBO. Pt had two large bowel movements today. Will progress diet and if tolerates well, anticipate discharge tomorrow.   #SBO with Ileus 2/2 Omental Infarct vs Paraneoplastic:CT imaging showed left anterior peritoneal mass and suspected partial SBO with dilation of jejunum. Unclear at this time whether his abdominal mass is due to omental infarct vs paraneoplastic process.  Surgery and radiology favor omental infarct at this time, as such IR recommends against biopsy. Gastrografin study yesterday showed continued partial SBO. Pt reported 2 large, liquidy, black bowel movements this morning. FOBT to assess for blood. NG tube clamped and trial clear liquid diet. If tolerated well, will remove NG tube and progress diet further.  -trial clearliquid diet - Protonix20 mgqd - IVF with LR at 125 mL/hr - f/u FOBT - Pain regimen: Dilaudid 0.5mg  q4hrs prn - Zofran PRN for nausea - Bowel regimen with Dulcolax andMiralax, increase as  needed   #AKI:FeNa 0.3%, consistent withpre-renal. sCr 1.19, down from 1.85 at admission(Baseline 1.1) - IVFLR 125 mL/hr - BMPqd  #Leukocytosis, unknown etiology: WBC 13.9, Still no clear sign of infection and clinically doing well. Final day of ceftriaxone and metronidazole. Unlikely but possibly due to stercoral colitis with partial SBO and persistent jejunal distention. It is possible this is a reactive leukocytosis and fever from his epiploic appendigitis. -f/u blood culture -cont Ceftriaxone 1g qd and Flagyl 500 mg q8h, Day 3/3 - CBC AM  #Hx of prostate cancer:s/p Radiation therapy in 2006. Currently under surveillance q 6 months.PSA at 2.2. CEA at 1.1.Pt to f/u with urologist at earliest convenience. -f/u urologist, possible outpatient cystoscopy  #HTN: - Hold home amlodipine for now  #Gout: - Hold allopurinol, naproxen and colchicine in the setting of AKI  #OSA - CPAP at night  This is a Careers information officer Note.  The care of the patient was discussed with Dr. Alfonse Spruce and the assessment and plan formulated with their assistance.  Please see their attached note for official documentation of the daily encounter.   LOS: 3 days   Jerry Chapman, Medical Student 03/22/2020, 8:15 AM

## 2020-03-23 ENCOUNTER — Inpatient Hospital Stay (HOSPITAL_COMMUNITY): Payer: Medicare HMO

## 2020-03-23 LAB — BASIC METABOLIC PANEL
Anion gap: 9 (ref 5–15)
BUN: 11 mg/dL (ref 8–23)
CO2: 25 mmol/L (ref 22–32)
Calcium: 8.8 mg/dL — ABNORMAL LOW (ref 8.9–10.3)
Chloride: 104 mmol/L (ref 98–111)
Creatinine, Ser: 1.09 mg/dL (ref 0.61–1.24)
GFR calc Af Amer: >60 mL/min (ref >60)
GFR calc non Af Amer: >60 mL/min (ref >60)
Glucose, Bld: 106 mg/dL — ABNORMAL HIGH (ref 70–99)
Potassium: 4.5 mmol/L (ref 3.5–5.1)
Sodium: 138 mmol/L (ref 135–145)

## 2020-03-23 LAB — CBC
HCT: 42.2 % (ref 39.0–52.0)
Hemoglobin: 13.8 g/dL (ref 13.0–17.0)
MCH: 23.8 pg — ABNORMAL LOW (ref 26.0–34.0)
MCHC: 32.7 g/dL (ref 30.0–36.0)
MCV: 72.9 fL — ABNORMAL LOW (ref 80.0–100.0)
Platelets: 410 10*3/uL — ABNORMAL HIGH (ref 150–400)
RBC: 5.79 MIL/uL (ref 4.22–5.81)
RDW: 15.4 % (ref 11.5–15.5)
WBC: 15.4 10*3/uL — ABNORMAL HIGH (ref 4.0–10.5)
nRBC: 0.1 % (ref 0.0–0.2)

## 2020-03-23 LAB — SAVE SMEAR(SSMR), FOR PROVIDER SLIDE REVIEW

## 2020-03-23 MED ORDER — PIPERACILLIN-TAZOBACTAM 3.375 G IVPB
3.3750 g | Freq: Three times a day (TID) | INTRAVENOUS | Status: DC
  Administered 2020-03-23 – 2020-03-30 (×19): 3.375 g via INTRAVENOUS
  Filled 2020-03-23 (×19): qty 50

## 2020-03-23 MED ORDER — POLYETHYLENE GLYCOL 3350 17 G PO PACK
17.0000 g | PACK | Freq: Every day | ORAL | Status: DC | PRN
  Administered 2020-03-23: 17 g via ORAL
  Filled 2020-03-23: qty 1

## 2020-03-23 MED ORDER — IOHEXOL 300 MG/ML  SOLN
100.0000 mL | Freq: Once | INTRAMUSCULAR | Status: AC | PRN
  Administered 2020-03-23: 100 mL via INTRAVENOUS

## 2020-03-23 NOTE — Progress Notes (Signed)
Subjective:  Hospital Day: 4  No overnight events.  Patient states that he feels great this morning, tolerated oatmeal for breakfast and felt much better afterwards. Reports frequent bowel movements since yesterday. These are still very liquidy but brown. Denies n/v, CP, SOB, leg swelling.  Objective: Vital signs in last 24 hours: Vitals:   03/22/20 0417 03/22/20 1448 03/22/20 2023 03/23/20 0513  BP: (!) 148/88 (!) 169/87 (!) 170/94 (!) 147/91  Pulse: 63 64 66 71  Resp: 18 17 19 18   Temp: 98.5 F (36.9 C) 97.9 F (36.6 C) 98 F (36.7 C) 98.2 F (36.8 C)  TempSrc: Oral Oral Oral Oral  SpO2: 95% 98% 99% 97%   Weight change:   Intake/Output Summary (Last 24 hours) at 03/23/2020 0715 Last data filed at 03/23/2020 0358 Gross per 24 hour  Intake 2890.55 ml  Output 2325 ml  Net 565.55 ml   Physical Exam Vitals and nursing note reviewed.  Constitutional: no acute distress Cardiovascular: regular rate and rhythm, normal heart sounds Pulmonary: effort normal,right sided lower lung crackles Abdominal:distention improved since yesterday, normoactive bowel sounds Musculoskeletal:no peripheral edema Skin: warm and dry Neurological: alert, no focal deficit Psychiatric: normal mood and affect  Lab Results: CBC Latest Ref Rng & Units 03/23/2020 03/22/2020 03/21/2020  WBC 4.0 - 10.5 K/uL 15.4(H) 13.9(H) 13.6(H)  Hemoglobin 13.0 - 17.0 g/dL 13.8 13.0 12.9(L)  Hematocrit 39 - 52 % 42.2 40.9 40.7  Platelets 150 - 400 K/uL 410(H) 373 349   CMP Latest Ref Rng & Units 03/23/2020 03/22/2020 03/21/2020  Glucose 70 - 99 mg/dL 106(H) 95 99  BUN 8 - 23 mg/dL 11 16 17   Creatinine 0.61 - 1.24 mg/dL 1.09 1.19 1.36(H)  Sodium 135 - 145 mmol/L 138 140 139  Potassium 3.5 - 5.1 mmol/L 4.5 4.3 4.2  Chloride 98 - 111 mmol/L 104 104 103  CO2 22 - 32 mmol/L 25 26 27   Calcium 8.9 - 10.3 mg/dL 8.8(L) 8.6(L) 8.4(L)  Total Protein 6.5 - 8.1 g/dL - - -  Total Bilirubin 0.3 - 1.2 mg/dL - - -  Alkaline  Phos 38 - 126 U/L - - -  AST 15 - 41 U/L - - -  ALT 0 - 44 U/L - - -    Assessment/Plan: Principal Problem:   Partial small bowel obstruction w/ ileus Active Problems:   HTN (hypertension)   Adenocarcinoma of prostate ([facility])   Acute kidney injury ([facility])   Abdominal mass   Microscopic hematuria  Jerry Chapman is a 69 year old male with PMH of prostate cancer,HTN and gout here for management for Partial SBO. Pt continues to have liquidy bowel movements and improved abdominal distention. Will progress diet and if tolerates well, anticipate discharge today pending abdominal CT.   #SBO with Ileus 2/2 Omental Infarct vs Paraneoplastic:CT imaging showed left anterior peritoneal mass and suspected partialSBOwith dilation of jejunum. Unclear at this time whether his abdominal mass is due to omental infarct vs paraneoplastic process.Surgery and radiology favor omental infarct, as suchIR recommendsagainst biopsy.Two black stools yesterday, FOBT negative, hgb stable. Likely from stool remaining in colon for an extended period rather than GI bleed. Subsequent stools have been brown.  Pt has progressed to soft diet today and seems to be tolerating it well thus far. Anticipate d/c today pending soft diet toleration and CT abdomen, see below. -trial softdiet - Protonix20 mgqd - IVF with LR at 100 mL/hr - Pain regimen: Dilaudid 0.5mg  q4hrs prn - Zofran PRN for nausea - Bowel  regimen with Dulcolax andMiralax, increase as needed  #AKI, resolved:FeNa 0.3%, consistent withpre-renal. sCr 1.09, down from 1.85 at admission(Baseline 1.1). D/c IVF pending toleration of soft diet. - IVFLR 100 mL/hr  #Leukocytosis, unknown etiology: WBC increased to 15.4 from 13.9, still no clear sign of infection and clinically doing well.Finished 3 day course of ceftriaxone and metronidazole yesterday. Unlikely but possibly due to stercoral colitis with partial SBO and persistent jejunal distention. It is  possible this is a reactive leukocytosis from his epiploic appendigitis. Surgery recommends CT abdomen today to evaluate. -f/u blood culture -d/c Ceftriaxone and Flagyl  #Hx of prostate cancer:s/p Radiation therapy in 2006. Currently under surveillance q 6 months.PSA at 2.2.CEA at 1.1.Pt to f/u with urologist at earliest convenience. -f/u urologist, possible outpatient cystoscopy  #HTN: - start home amlodipine 10 mg qd  #Gout: - Hold allopurinol, naproxen and colchicine in the setting of AKI  #OSA - CPAP at night  This is a Careers information officer Note.  The care of the patient was discussed with Dr. Alfonse Spruce and the assessment and plan formulated with their assistance.  Please see their attached note for official documentation of the daily encounter.   LOS: 4 days   Jerry Chapman, Medical Student 03/23/2020, 7:15 AM

## 2020-03-23 NOTE — Progress Notes (Signed)
[facility] Progress Note     Subjective: Patient denies abdominal pain. Tolerating FLD. Denies nausea. Having some diarrhea.   Objective: Vital signs in last 24 hours: Temp:  [97.9 F (36.6 C)-98.2 F (36.8 C)] 98.2 F (36.8 C) (07/29 0513) Pulse Rate:  [64-71] 71 (07/29 0513) Resp:  [17-19] 18 (07/29 0513) BP: (147-170)/(87-94) 147/91 (07/29 0513) SpO2:  [97 %-99 %] 97 % (07/29 0513) Last BM Date: 03/22/20  Intake/Output from previous day: 07/28 0701 - 07/29 0700 In: 2890.6 [P.O.:1110; I.V.:1387.2; IV Piggyback:393.3] Out: 2325 [Urine:2325] Intake/Output this shift: No intake/output data recorded.  PE: General: pleasant, WD,obesemale who is laying in bed in NAD Heart: regular, rate, and rhythm. Palpable radial and pedal pulses bilaterally Lungs: Respiratory effort nonlabored Abd: soft, NT,less distended, +BS MS: all 4 extremities are symmetrical with no cyanosis, clubbing, or edema. Skin: warm and dry with no masses, lesions, or rashes   Lab Results:  Recent Labs    03/22/20 0523 03/23/20 0052  WBC 13.9* 15.4*  HGB 13.0 13.8  HCT 40.9 42.2  PLT 373 410*   BMET Recent Labs    03/22/20 0523 03/23/20 0052  NA 140 138  K 4.3 4.5  CL 104 104  CO2 26 25  GLUCOSE 95 106*  BUN 16 11  CREATININE 1.19 1.09  CALCIUM 8.6* 8.8*   PT/INR No results for input(s): LABPROT, INR in the last 72 hours. CMP     Component Value Date/Time   NA 138 03/23/2020 0052   NA 140 12/08/2019 1436   K 4.5 03/23/2020 0052   CL 104 03/23/2020 0052   CO2 25 03/23/2020 0052   GLUCOSE 106 (H) 03/23/2020 0052   BUN 11 03/23/2020 0052   BUN 18 12/08/2019 1436   CREATININE 1.09 03/23/2020 0052   CREATININE 1.31 (H) 07/16/2015 1539   CALCIUM 8.8 (L) 03/23/2020 0052   PROT 7.8 03/18/2020 1954   PROT 7.8 12/08/2019 1436   ALBUMIN 3.3 (L) 03/18/2020 1954   ALBUMIN 4.7 12/08/2019 1436   AST 23 03/18/2020 1954   ALT 29 03/18/2020 1954   ALKPHOS 54 03/18/2020 1954    BILITOT 1.2 03/18/2020 1954   BILITOT 0.4 12/08/2019 1436   GFRNONAA >60 03/23/2020 0052   GFRNONAA 64 11/07/2014 1047   GFRAA >60 03/23/2020 0052   GFRAA 74 11/07/2014 1047   Lipase     Component Value Date/Time   LIPASE 25 03/18/2020 1954       Studies/Results: DG Abd Portable 1V-Small Bowel Obstruction Protocol-initial, 8 hr delay  Result Date: 03/21/2020 CLINICAL DATA:  Follow up small bowel obstruction EXAM: PORTABLE ABDOMEN - 1 VIEW COMPARISON:  Film from earlier in the same day. FINDINGS: Gastric catheter is again noted in the stomach. Scattered large and small bowel gas is noted. Previously administered contrast is now seen within the colon. Mild small bowel dilatation is again noted in the left mid abdomen similar to that seen on prior CT examination. No free air is seen. IMPRESSION: Contrast has passed to the colon consistent with a partial small bowel obstruction. The small bowel dilatation on the left is stable from the prior CT. Electronically Signed   By: Inez Catalina M.D.   On: 03/21/2020 22:38   DG Abd Portable 1V-Small Bowel Protocol-Position Verification  Result Date: 03/21/2020 CLINICAL DATA:  Nasogastric tube placement EXAM: PORTABLE ABDOMEN - 1 VIEW COMPARISON:  CT abdomen and pelvis March 19, 2020; abdominal radiograph March 21, 2020 obtained earlier in the day FINDINGS: Nasogastric tube  tip and side port are in the stomach. Multiple loops of dilated bowel remain. No free air. There is atelectatic change in the left lung base. IMPRESSION: Nasogastric tube tip and side port in stomach. Bowel dilatation concerning for persistent bowel obstruction remains. No free air evident. Left base atelectasis. Electronically Signed   By: Lowella Grip III M.D.   On: 03/21/2020 10:55   DG Abd Portable 1V  Result Date: 03/21/2020 CLINICAL DATA:  Abdominal pain and constipation for 4 days, history ileus EXAM: PORTABLE ABDOMEN - 1 VIEW COMPARISON:  Portable exam 0824 hours  compared to 03/18/2020 FINDINGS: Persistent gaseous distension of small bowel loops throughout the abdomen with paucity of colonic gas either representing persistent small bowel ileus or obstruction. Small amount gas within stomach. Osseous structures unremarkable. IMPRESSION: Persistent gaseous distension small bowel question small-bowel obstruction or ileus. Electronically Signed   By: Lavonia Dana M.D.   On: 03/21/2020 08:31    Anti-infectives: Anti-infectives (From admission, onward)   Start     Dose/Rate Route Frequency Ordered Stop   03/20/20 0645  cefTRIAXone (ROCEPHIN) 1 g in sodium chloride 0.9 % 100 mL IVPB        1 g 200 mL/hr over 30 Minutes Intravenous Every 24 hours 03/20/20 0641 03/22/20 1513   03/20/20 0645  metroNIDAZOLE (FLAGYL) IVPB 500 mg        500 mg 100 mL/hr over 60 Minutes Intravenous Every 8 hours 03/20/20 0641 03/22/20 2359       Assessment/Plan AKI- Cr improving  Prostate cancer-radiation therapy Arthritis Gout Hypertension Sleep apnea - tested, but never got the CPAP machine  Abdominal pain with omental infarction Ileus - NGT removed yesterday evening, tolerating FLD - advance to soft diet this AM - WBC up a little but afebrile and clinically improving  - I think likely could discharge later today if tolerating soft diet, will discuss with MD if repeat CT needed prior to discharge   JOI:TGPQ diet  DI:YMEBRAXE/NMMHWK 7/26>> DVT:SQ heparin  LOS: 4 days    Norm Parcel , [facility] 03/23/2020, 7:44 AM Please see Amion for pager number during day hours 7:00am-4:30pm

## 2020-03-23 NOTE — Progress Notes (Addendum)
Pharmacy Antibiotic Note  Jerry Chapman is a 69 y.o. male admitted on 03/18/2020 with abdominal pain with omental infarction, ileus.  Pt received a course of ceftriaxone/Flagyl from 7/26-7/28. Per provider note, CT scan today showed worsening SBO, enlarging mass (possible abscess). Pharmacy has been consulted for Zosyn dosing for intraabdominal infection.  WBC 15.4, afebrile, Scr 1.06 (downfrom 1.85 on admission), CrCl 76 ml/min  Plan: Zosyn 3.375 gm IV Q 8 hrs (extended infusion) Monitor WBC, temp, clinical improvement, cultures, renal function, length of therapy  Total Body Weight: 103.9 kg Height: 69 I nches  Temp (24hrs), Avg:98.2 F (36.8 C), Min:98 F (36.7 C), Max:98.5 F (36.9 C)  Recent Labs  Lab 03/18/20 1954 03/20/20 0423 03/20/20 0741 03/20/20 0855 03/21/20 0704 03/22/20 0523 03/23/20 0052  WBC 14.6*  14.7* 16.7*  --   --  13.6* 13.9* 15.4*  CREATININE 1.85* 1.44*  --   --  1.36* 1.19 1.09  LATICACIDVEN  --   --  1.5 1.4  --   --   --     CrCl cannot be calculated (Unknown ideal weight.).    No Known Allergies  Antimicrobials this admission: 7/26-7/28 ceftriaxone, metronidazole  Microbiology results: 7/26 BCx X 2: NG at 3 days 7/25 COVID, HIV screen: negative  Thank you for allowing pharmacy to be a part of this patient's care.  Gillermina Hu, PharmD, BCPS, [facility] Clinical Pharmacist 03/23/2020 7:33 PM

## 2020-03-24 ENCOUNTER — Inpatient Hospital Stay (HOSPITAL_COMMUNITY): Payer: Medicare HMO | Admitting: Certified Registered Nurse Anesthetist

## 2020-03-24 ENCOUNTER — Encounter (HOSPITAL_COMMUNITY)
Admission: EM | Disposition: A | Discharge status: Home / Self Care | Payer: Self-pay | Source: Home / Self Care | Attending: Internal Medicine

## 2020-03-24 ENCOUNTER — Inpatient Hospital Stay (HOSPITAL_COMMUNITY): Payer: Medicare HMO

## 2020-03-24 ENCOUNTER — Encounter (HOSPITAL_COMMUNITY): Payer: Self-pay | Admitting: Internal Medicine

## 2020-03-24 HISTORY — PX: OMENTECTOMY: SHX5985

## 2020-03-24 HISTORY — PX: IRRIGATION AND DEBRIDEMENT ABSCESS: SHX5252

## 2020-03-24 HISTORY — PX: BOWEL RESECTION: SHX1257

## 2020-03-24 HISTORY — PX: LAPAROTOMY: SHX154

## 2020-03-24 HISTORY — PX: SKIN BIOPSY: SHX1

## 2020-03-24 HISTORY — PX: LAPAROSCOPY: SHX197

## 2020-03-24 LAB — SURGICAL PCR SCREEN
MRSA, PCR: NEGATIVE
Staphylococcus aureus: NEGATIVE

## 2020-03-24 LAB — PATHOLOGIST SMEAR REVIEW

## 2020-03-24 SURGERY — LAPAROSCOPY, DIAGNOSTIC
Anesthesia: General | Site: Abdomen

## 2020-03-24 MED ORDER — LIDOCAINE 2% (20 MG/ML) 5 ML SYRINGE
INTRAMUSCULAR | Status: DC | PRN
  Administered 2020-03-24: 80 mg via INTRAVENOUS

## 2020-03-24 MED ORDER — SUGAMMADEX SODIUM 200 MG/2ML IV SOLN
INTRAVENOUS | Status: DC | PRN
  Administered 2020-03-24: 200 mg via INTRAVENOUS

## 2020-03-24 MED ORDER — SUCCINYLCHOLINE CHLORIDE 200 MG/10ML IV SOSY
PREFILLED_SYRINGE | INTRAVENOUS | Status: AC
  Filled 2020-03-24: qty 10

## 2020-03-24 MED ORDER — PROPOFOL 10 MG/ML IV BOLUS
INTRAVENOUS | Status: DC | PRN
  Administered 2020-03-24: 170 mg via INTRAVENOUS

## 2020-03-24 MED ORDER — DIPHENHYDRAMINE HCL 50 MG/ML IJ SOLN
12.5000 mg | Freq: Four times a day (QID) | INTRAMUSCULAR | Status: DC | PRN
  Filled 2020-03-24: qty 0.25

## 2020-03-24 MED ORDER — FENTANYL CITRATE (PF) 250 MCG/5ML IJ SOLN
INTRAMUSCULAR | Status: AC
  Filled 2020-03-24: qty 5

## 2020-03-24 MED ORDER — ONDANSETRON HCL 4 MG/2ML IJ SOLN
4.0000 mg | Freq: Four times a day (QID) | INTRAMUSCULAR | Status: DC | PRN
  Filled 2020-03-24: qty 2

## 2020-03-24 MED ORDER — BUPIVACAINE LIPOSOME 1.3 % IJ SUSP
INTRAMUSCULAR | Status: DC | PRN
  Administered 2020-03-24: 10 mL

## 2020-03-24 MED ORDER — DEXAMETHASONE SODIUM PHOSPHATE 10 MG/ML IJ SOLN
INTRAMUSCULAR | Status: AC
  Filled 2020-03-24: qty 1

## 2020-03-24 MED ORDER — LIDOCAINE 2% (20 MG/ML) 5 ML SYRINGE
INTRAMUSCULAR | Status: AC
  Filled 2020-03-24: qty 5

## 2020-03-24 MED ORDER — MORPHINE SULFATE 2 MG/ML IV SOLN
INTRAVENOUS | Status: DC
  Administered 2020-03-24: 4 mg via INTRAVENOUS
  Administered 2020-03-26 (×2): 3 mg via INTRAVENOUS
  Filled 2020-03-24 (×2): qty 30

## 2020-03-24 MED ORDER — MIDAZOLAM HCL 2 MG/2ML IJ SOLN
INTRAMUSCULAR | Status: AC
  Filled 2020-03-24: qty 2

## 2020-03-24 MED ORDER — ROCURONIUM BROMIDE 10 MG/ML (PF) SYRINGE
PREFILLED_SYRINGE | INTRAVENOUS | Status: AC
  Filled 2020-03-24: qty 10

## 2020-03-24 MED ORDER — PHENYLEPHRINE 40 MCG/ML (10ML) SYRINGE FOR IV PUSH (FOR BLOOD PRESSURE SUPPORT)
PREFILLED_SYRINGE | INTRAVENOUS | Status: DC | PRN
  Administered 2020-03-24: 120 ug via INTRAVENOUS

## 2020-03-24 MED ORDER — FENTANYL CITRATE (PF) 250 MCG/5ML IJ SOLN
INTRAMUSCULAR | Status: DC | PRN
  Administered 2020-03-24: 50 ug via INTRAVENOUS
  Administered 2020-03-24: 100 ug via INTRAVENOUS
  Administered 2020-03-24 (×2): 50 ug via INTRAVENOUS
  Administered 2020-03-24 (×2): 100 ug via INTRAVENOUS
  Administered 2020-03-24: 50 ug via INTRAVENOUS

## 2020-03-24 MED ORDER — SODIUM CHLORIDE 0.9 % IR SOLN
Status: DC | PRN
  Administered 2020-03-24: 3000 mL

## 2020-03-24 MED ORDER — DEXAMETHASONE SODIUM PHOSPHATE 10 MG/ML IJ SOLN
INTRAMUSCULAR | Status: DC | PRN
  Administered 2020-03-24: 8 mg via INTRAVENOUS

## 2020-03-24 MED ORDER — SODIUM CHLORIDE 0.9% FLUSH: 9.0000 mL | INTRAVENOUS | Status: DC | PRN

## 2020-03-24 MED ORDER — DIPHENHYDRAMINE HCL 12.5 MG/5ML PO ELIX
12.5000 mg | ORAL_SOLUTION | Freq: Four times a day (QID) | ORAL | Status: DC | PRN
  Filled 2020-03-24: qty 5

## 2020-03-24 MED ORDER — LACTATED RINGERS IV SOLN: INTRAVENOUS | Status: DC | PRN

## 2020-03-24 MED ORDER — HYDROMORPHONE HCL 1 MG/ML IJ SOLN: 0.2500 mg | INTRAMUSCULAR | Status: DC | PRN

## 2020-03-24 MED ORDER — ROCURONIUM BROMIDE 10 MG/ML (PF) SYRINGE
PREFILLED_SYRINGE | INTRAVENOUS | Status: DC | PRN
  Administered 2020-03-24: 40 mg via INTRAVENOUS

## 2020-03-24 MED ORDER — BUPIVACAINE HCL (PF) 0.25 % IJ SOLN
INTRAMUSCULAR | Status: DC | PRN
  Administered 2020-03-24: 20 mL

## 2020-03-24 MED ORDER — SODIUM CHLORIDE 0.9 % IV SOLN: INTRAVENOUS | Status: DC

## 2020-03-24 MED ORDER — ONDANSETRON HCL 4 MG/2ML IJ SOLN
INTRAMUSCULAR | Status: DC | PRN
  Administered 2020-03-24: 4 mg via INTRAVENOUS

## 2020-03-24 MED ORDER — CHLORHEXIDINE GLUCONATE 0.12 % MT SOLN
OROMUCOSAL | Status: AC
  Filled 2020-03-24: qty 15

## 2020-03-24 MED ORDER — SUCCINYLCHOLINE CHLORIDE 200 MG/10ML IV SOSY
PREFILLED_SYRINGE | INTRAVENOUS | Status: DC | PRN
  Administered 2020-03-24: 120 mg via INTRAVENOUS

## 2020-03-24 MED ORDER — NALOXONE HCL 0.4 MG/ML IJ SOLN
0.4000 mg | INTRAMUSCULAR | Status: DC | PRN
  Filled 2020-03-24: qty 1

## 2020-03-24 MED ORDER — PROPOFOL 10 MG/ML IV BOLUS
INTRAVENOUS | Status: AC
  Filled 2020-03-24: qty 20

## 2020-03-24 MED ORDER — BUPIVACAINE HCL (PF) 0.25 % IJ SOLN
INTRAMUSCULAR | Status: AC
  Filled 2020-03-24: qty 30

## 2020-03-24 MED ORDER — ONDANSETRON HCL 4 MG/2ML IJ SOLN
INTRAMUSCULAR | Status: AC
  Filled 2020-03-24: qty 2

## 2020-03-24 SURGICAL SUPPLY — 49 items
APPLIER CLIP 5 13 M/L LIGAMAX5 (MISCELLANEOUS)
BNDG GAUZE ELAST 4 BULKY (GAUZE/BANDAGES/DRESSINGS) ×6 IMPLANT
CANISTER SUCT 3000ML PPV (MISCELLANEOUS) IMPLANT
CHLORAPREP W/TINT 26 (MISCELLANEOUS) ×3 IMPLANT
CLIP APPLIE 5 13 M/L LIGAMAX5 (MISCELLANEOUS) IMPLANT
COVER SURGICAL LIGHT HANDLE (MISCELLANEOUS) ×3 IMPLANT
COVER WAND RF STERILE (DRAPES) ×3 IMPLANT
DECANTER SPIKE VIAL GLASS SM (MISCELLANEOUS) ×3 IMPLANT
DERMABOND ADVANCED (GAUZE/BANDAGES/DRESSINGS) ×2
DERMABOND ADVANCED .7 DNX12 (GAUZE/BANDAGES/DRESSINGS) ×1 IMPLANT
DRAPE C-ARM 42X72 X-RAY (DRAPES) IMPLANT
DRAPE LAPAROSCOPIC ABDOMINAL (DRAPES) ×3 IMPLANT
DRSG PAD ABDOMINAL 8X10 ST (GAUZE/BANDAGES/DRESSINGS) ×6 IMPLANT
ELECT REM PT RETURN 9FT ADLT (ELECTROSURGICAL) ×3
ELECTRODE REM PT RTRN 9FT ADLT (ELECTROSURGICAL) ×1 IMPLANT
GLOVE SURG SIGNA 7.5 PF LTX (GLOVE) ×3 IMPLANT
GOWN STRL REUS W/ TWL LRG LVL3 (GOWN DISPOSABLE) ×2 IMPLANT
GOWN STRL REUS W/ TWL XL LVL3 (GOWN DISPOSABLE) ×1 IMPLANT
GOWN STRL REUS W/TWL LRG LVL3 (GOWN DISPOSABLE) ×6
GOWN STRL REUS W/TWL XL LVL3 (GOWN DISPOSABLE) ×3
HANDLE SUCTION POOLE (INSTRUMENTS) ×1 IMPLANT
KIT BASIN OR (CUSTOM PROCEDURE TRAY) ×3 IMPLANT
KIT TURNOVER KIT B (KITS) ×3 IMPLANT
LIGASURE IMPACT 36 18CM CVD LR (INSTRUMENTS) ×3 IMPLANT
NS IRRIG 1000ML POUR BTL (IV SOLUTION) ×3 IMPLANT
PAD ARMBOARD 7.5X6 YLW CONV (MISCELLANEOUS) ×3 IMPLANT
PENCIL SMOKE EVACUATOR (MISCELLANEOUS) ×3 IMPLANT
RELOAD PROXIMATE 75MM BLUE (ENDOMECHANICALS) ×6 IMPLANT
SCISSORS LAP 5X35 DISP (ENDOMECHANICALS) IMPLANT
SET IRRIG TUBING LAPAROSCOPIC (IRRIGATION / IRRIGATOR) IMPLANT
SET TUBE SMOKE EVAC HIGH FLOW (TUBING) ×3 IMPLANT
SLEEVE ENDOPATH XCEL 5M (ENDOMECHANICALS) ×3 IMPLANT
SPONGE LAP 18X18 RF (DISPOSABLE) ×3 IMPLANT
STAPLER GUN LINEAR PROX 60 (STAPLE) ×3 IMPLANT
STAPLER PROXIMATE 75MM BLUE (STAPLE) ×3 IMPLANT
STAPLER VISISTAT 35W (STAPLE) ×3 IMPLANT
SUCTION POOLE HANDLE (INSTRUMENTS) ×3
SUT MON AB 4-0 PC3 18 (SUTURE) ×3 IMPLANT
SUT PDS AB 1 TP1 96 (SUTURE) IMPLANT
TAPE CLOTH SURG 6X10 WHT LF (GAUZE/BANDAGES/DRESSINGS) ×3 IMPLANT
TOWEL GREEN STERILE (TOWEL DISPOSABLE) ×3 IMPLANT
TOWEL GREEN STERILE FF (TOWEL DISPOSABLE) ×3 IMPLANT
TRAY LAPAROSCOPIC MC (CUSTOM PROCEDURE TRAY) ×3 IMPLANT
TROCAR XCEL BLUNT TIP 100MML (ENDOMECHANICALS) ×3 IMPLANT
TROCAR XCEL NON-BLD 11X100MML (ENDOMECHANICALS) IMPLANT
TROCAR XCEL NON-BLD 5MMX100MML (ENDOMECHANICALS) ×3 IMPLANT
TUBE CONNECTING 12'X1/4 (SUCTIONS) ×1
TUBE CONNECTING 12X1/4 (SUCTIONS) ×2 IMPLANT
YANKAUER SUCT BULB TIP NO VENT (SUCTIONS) ×3 IMPLANT

## 2020-03-24 NOTE — Progress Notes (Signed)
Pt arrived to 6N28. Pt awake and alert. Pt c/o 10/10 pain in surgical site. MD, Ninfa Linden, paged for new orders. See MAR. See assessment. Will continue to monitor pt.

## 2020-03-24 NOTE — Progress Notes (Signed)
Patient ID: Jerry Chapman, male   DOB: 1950/10/31, 69 y.o.   MRN: [id number]   He has no complaints of abdominal pain this morning.  He denies nausea.  The CT scan, however, shows a worsening partial small bowel obstruction with an increase in the size of the process in the omentum and small bowel since the scan earlier this week.  It is difficult to tell whether this represents abscess or tumor.  Given the findings on the CT scan, I recommend we proceed to the operating room for a diagnostic laparoscopy.  I discussed the reasons for this with him in detail.  I am worried that he would develop worsening obstruction or that we are missing a malignancy.  Again, his CEA is normal.  I discussed the surgical procedure in detail.  I discussed the potential for an exploratory laparotomy and bowel resection.  I discussed the risk of the procedure which includes but is not limited to bleeding, infection, injury to surrounding structures, the need to convert to an open procedure, the need for bowel resection, the need for drains, the need for further procedures, cardiopulmonary issues, etc.  He understands and agrees to proceed with surgery which is scheduled for today

## 2020-03-24 NOTE — Anesthesia Procedure Notes (Signed)
Procedure Name: Intubation Date/Time: 03/24/2020 11:11 AM Performed by: Verdie Drown, CRNA Pre-anesthesia Checklist: Patient identified, Emergency Drugs available, Suction available and Patient being monitored Patient Re-evaluated:Patient Re-evaluated prior to induction Oxygen Delivery Method: Circle System Utilized Preoxygenation: Pre-oxygenation with 100% oxygen Induction Type: IV induction Ventilation: Mask ventilation without difficulty Laryngoscope Size: Mac and 3 Grade View: Grade II Tube type: Oral Tube size: 7.5 mm Number of attempts: 1 Airway Equipment and Method: Stylet and Oral airway Placement Confirmation: ETT inserted through vocal cords under direct vision,  positive ETCO2 and breath sounds checked- equal and bilateral Secured at: 22 cm Tube secured with: Tape Dental Injury: Teeth and Oropharynx as per pre-operative assessment  Comments: Manuela Schwartz, CRNA

## 2020-03-24 NOTE — Plan of Care (Signed)

## 2020-03-24 NOTE — Progress Notes (Signed)
Subjective: Hospital Day: 5  CT abdomen yesterday showed worsening proximal small bowel dilation and an enlarging anterior abdominal mass as compared to his prior CT on 7/25.  Jerry Chapman was evaluated at bedside this morning. He reports that he continues to feel well and have liquidy bowel movements. His pain is resolved in his epigastrium but still 1/10 on palpation in his lower abdomen. Dr. Ninfa Linden was in the room and discussed the need for a diagnostic laparoscopy today to determine the source of the growing abdominal mass.  Objective: Vital signs in last 24 hours: Vitals:   03/23/20 1548 03/23/20 1900 03/23/20 2019 03/24/20 0452  BP: (!) 156/90  (!) 145/90 (!) 152/91  Pulse: 68  71 71  Resp: 18     Temp: 98.5 F (36.9 C)  98 F (36.7 C) 97.7 F (36.5 C)  TempSrc: Oral  Oral Oral  SpO2: 99%  98% 97%  Weight:  (!) 103.9 kg    Height:  5\' 9"  (1.753 m)     Weight change:   Intake/Output Summary (Last 24 hours) at 03/24/2020 0701 Last data filed at 03/24/2020 0452 Gross per 24 hour  Intake 177.81 ml  Output 400 ml  Net -222.19 ml   Physical Exam Vitals and nursing note reviewed.  Constitutional: no acute distress Cardiovascular: regular rate and rhythm, normal heart sounds Pulmonary: effort normal, normal breath sounds bilaterally Abdominal:distention improved since yesterday, normoactive bowel sounds Musculoskeletal:no peripheral edema Skin: warm and dry Neurological: alert, no focal deficit Psychiatric: normal mood and affect  Lab Results: CBC Latest Ref Rng & Units 03/23/2020 03/22/2020 03/21/2020  WBC 4.0 - 10.5 K/uL 15.4(H) 13.9(H) 13.6(H)  Hemoglobin 13.0 - 17.0 g/dL 13.8 13.0 12.9(L)  Hematocrit 39 - 52 % 42.2 40.9 40.7  Platelets 150 - 400 K/uL 410(H) 373 349   CMP Latest Ref Rng & Units 03/23/2020 03/22/2020 03/21/2020  Glucose 70 - 99 mg/dL 106(H) 95 99  BUN 8 - 23 mg/dL 11 16 17   Creatinine 0.61 - 1.24 mg/dL 1.09 1.19 1.36(H)  Sodium 135 - 145 mmol/L  138 140 139  Potassium 3.5 - 5.1 mmol/L 4.5 4.3 4.2  Chloride 98 - 111 mmol/L 104 104 103  CO2 22 - 32 mmol/L 25 26 27   Calcium 8.9 - 10.3 mg/dL 8.8(L) 8.6(L) 8.4(L)  Total Protein 6.5 - 8.1 g/dL - - -  Total Bilirubin 0.3 - 1.2 mg/dL - - -  Alkaline Phos 38 - 126 U/L - - -  AST 15 - 41 U/L - - -  ALT 0 - 44 U/L - - -    Studies/Results: CT ABDOMEN PELVIS W CONTRAST IMPRESSION: 1. Significantly increased proximal small bowel dilatation is noted consistent with worsening small bowel obstruction, with transition zone seen in the anterior portion of the abdomen best seen on image number 50 of series 3. There is again noted 3.3 x 2.3 cm masslike density adjacent to anterior abdominal wall which appears to extend into the adjacent small bowel loop. There is also noted 4.2 x 3.1 cm mass superior to this same bowel loop which appears to be enlarged compared to prior exam. It is uncertain if this represents neoplasm, infection or possibly infection with adjacent enlarging abscess. Aortic Atherosclerosis (ICD10-I70.0). Electronically Signed   By: Marijo Conception M.D.   On: 03/23/2020 17:54   Assessment/Plan: Principal Problem:   Partial small bowel obstruction w/ ileus Active Problems:   HTN (hypertension)   Adenocarcinoma of prostate ([place])   Acute kidney injury (  [place])   Abdominal mass   Microscopic hematuria  Jerry Chapman is a 69 year old male with PMH of prostate cancer,HTN and gout here for management for Partial SBO.Repeat CT yesterday showed worsening small bowel dilation and growth of the abdominal mass. Diagnostic laparoscopy to performed today, pathology pending.   #SBO with Ileus 2/2 Omental Infarct vs Paraneoplastic:CT imaging on 7/25 showed left anterior peritoneal mass and suspected partialSBOwith dilation of jejunum.  Repeat imagine on 7/29 showed worsening dilation of the small bowel and growth of the anterior abdominal mass. It is unclear at this time what this mass is -  abscess vs paraneoplastic vs omental infarct.Surgery consulted and performed diagnostic laparoscopy today. Found an omental mass with formation of an abscess. Additionally found a loop of small bowel which was inflammed, unable to to determine whether this was malignant infiltration. This loop of bowel was excised together with the omental mass and sent for pathology. Also found a second mass involving the sigmoid colon which was excised and sent for pathology. Will continue Zosyn for intra-abdominal abscess. -f/u pathology -cont. Zosyn 3.375 g q8h, Day 2 -NPO - Protonix20 mgqd - IVF with LR at 100 mL/hr - Pain regimen: Morphine 2mg /mL PCA - Zofran PRN for nausea - Bowel regimen with Dulcolax andMiralax, increase as needed  #Leukocytosis, unknown etiology: Likely secondary to intra-abdominal abscess. Blood culture neg x3 days. Will continue Zosyn. -cont. Zosyn 3.375 g q8h, Day 2  #Hx of prostate cancer:s/p Radiation therapy in 2006. Currently under surveillance q 6 months.PSA at 2.2.CEA at 1.1.Pt to f/u with urologist at earliest convenience. -f/u urologist, possible outpatient cystoscopy  #HTN: - start home amlodipine 10 mg qd  #Gout: - Hold allopurinol, naproxen and colchicine in the setting of AKI  #OSA - CPAP at night  This is a Careers information officer Note.  The care of the patient was discussed with Dr. Alfonse Spruce and the assessment and plan formulated with their assistance.  Please see their attached note for official documentation of the daily encounter.   LOS: 5 days   Calvert Cantor, Medical Student 03/24/2020, 7:01 AM

## 2020-03-24 NOTE — Anesthesia Preprocedure Evaluation (Addendum)
Anesthesia Evaluation  Patient identified by MRN, date of birth, ID band Patient awake    Reviewed: Allergy & Precautions, NPO status , Patient's Chart, lab work & pertinent test results  Airway Mallampati: II  TM Distance: >3 FB     Dental   Pulmonary sleep apnea ,    breath sounds clear to auscultation       Cardiovascular hypertension,  Rhythm:Regular Rate:Normal     Neuro/Psych negative neurological ROS  negative psych ROS   GI/Hepatic negative GI ROS, Neg liver ROS, Patient did not received Oral Contrast Agents,  Endo/Other  negative endocrine ROS  Renal/GU Renal disease     Musculoskeletal  (+) Arthritis ,   Abdominal   Peds  Hematology   Anesthesia Other Findings   Reproductive/Obstetrics                             Anesthesia Physical Anesthesia Plan  ASA: III  Anesthesia Plan: General   Post-op Pain Management:    Induction: Intravenous  PONV Risk Score and Plan: 2 and Ondansetron, Dexamethasone and Midazolam  Airway Management Planned: Oral ETT  Additional Equipment:   Intra-op Plan:   Post-operative Plan: Possible Post-op intubation/ventilation  Informed Consent: I have reviewed the patients History and Physical, chart, labs and discussed the procedure including the risks, benefits and alternatives for the proposed anesthesia with the patient or authorized representative who has indicated his/her understanding and acceptance.     Dental advisory given  Plan Discussed with: CRNA and Anesthesiologist  Anesthesia Plan Comments:         Anesthesia Quick Evaluation

## 2020-03-24 NOTE — Transfer of Care (Signed)
Immediate Anesthesia Transfer of Care Note  Patient: Jerry Chapman  Procedure(s) Performed: LAPAROSCOPY DIAGNOSTIC (N/A Abdomen) EXPLORATORY LAPAROTOMY (N/A Abdomen) SMALL BOWEL RESECTION (N/A Abdomen) PARTIAL OMENTECTOMY (N/A Abdomen) BIOPSY OF MESENTERIC NODULE (N/A Abdomen) DRAINAGE OF MESENTERIC ABSCESS (N/A Abdomen)  Patient Location: PACU  Anesthesia Type:General  Level of Consciousness: awake, alert , oriented, patient cooperative and responds to stimulation  Airway & Oxygen Therapy: Patient Spontanous Breathing and Patient connected to nasal cannula oxygen  Post-op Assessment: Report given to RN and Post -op Vital signs reviewed and stable  Post vital signs: Reviewed and stable  Last Vitals:  Vitals Value Taken Time  BP 170/94 03/24/20 1231  Temp    Pulse 69 03/24/20 1237  Resp 13 03/24/20 1237  SpO2 91 % 03/24/20 1237  Vitals shown include unvalidated device data.  Last Pain:  Vitals:   03/24/20 0746  TempSrc:   PainSc: 0-No pain      Patients Stated Pain Goal: 0 (34/28/76 8115)  Complications: No complications documented.

## 2020-03-24 NOTE — Op Note (Signed)
Jerry Chapman 03/24/2020   Pre-op Diagnosis: Small Bowel Obstruction, mesenteric abscess, possible malignancy     Post-op Diagnosis: same  Procedure(s): LAPAROSCOPY DIAGNOSTIC EXPLORATORY LAPAROTOMY SMALL BOWEL RESECTION PARTIAL OMENTECTOMY EXCISIONAL BIOPSY OF MESENTERIC NODULE DRAINAGE OF MESENTERIC ABSCESS  Surgeon(s): Coralie Keens, MD  Anesthesia: General  Staff:  Circulator: Gleason, Chloe, RN Scrub Person: Rometta Emery, RN; Rolan Bucco  Estimated Blood Loss: Minimal               Specimens: SENT TO PATH  Indications: This is a 69 year old gentleman who presented with abdominal pain, nausea, and vomiting. He underwent a CT scan of his abdomen pelvis showing a possible omental or mesenteric infarct with a possible mass versus abscess. He was clinically improving but because of an elevated white blood count the decision was made to repeat a CAT scan which showed worsening of the process and dilation of his bowel. The decision was made to proceed to the operating room for evaluation.  Findings: The patient was initially found to have omentum stuck to the abdominal wall which then became free. There appeared to be a mass in the omentum which was slightly necrotic. There was an abscess between this and the small bowel mesentery with a large amount of purulence. I could not see a hole in his transverse colon which was completely normal in appearance. A loop of small bowel was secondarily inflamed multiple areas and thick walled. It was difficult to tell whether it could be a malignancy involving the small bowel. I elected to resect the omentum as well as the involved loop of small bowel. There was a second 2 to 3 cm hard nodule and an epiploica of the sigmoid colon which I excised as well. There appeared to be large lymph nodes down in the retroperitoneum of the pelvis.  Procedure: The patient was brought to the operating room and identifies correct patient. He  is placed upon the operating table general anesthesia was induced. His abdomen was then prepped and draped in usual sterile fashion. I made a small vertical incision above the umbilicus with a scalpel. I took this down to the fascia which was then open with a scalpel. I then used a hemostat to gain entrance to the peritoneal cavity under direct vision. A 0 Vicryl pursing suture was placed around the fascial opening. The [facility] port was placed the opening and insufflation of the abdomen was begun. I could see a large mass of omentum stuck to the left upper quadrant. I was able to free this up. There was small bowel involved in this area and the appearance appeared consistent with a malignancy. At this point decision was made to convert to an open laparotomy. I deflated the abdomen and remove the [facility] port. I then made the midline incision larger going toward the xiphoid. I then identified the area of omentum which was quite hard. There were several loops of small bowel stuck in this area. As I was freeing this up I entered a large abscess cavity and purulence was drained from this. I was then able to completely free the small bowel from the abscess and the overlying omentum. The small bowel itself was thickened and injected. It was difficult to tell whether it could be a mass in the small bowel. I could not identify a hole in the small bowel. I then started resecting the omentum with the LigaSure. I could easily visualize the transverse colon underneath this and could palpate no abnormalities  or visualize any hole in the transverse colon. I then excised the large portion of omentum containing the mass and sent it to pathology. I then elected to perform a small bowel resection of the area of concern because of the possibility of malignancy. I transected the small bowel proximal and distal to this area with a GIA-75 stapler. I then excised the mesentery with the LigaSure. I removed approximately 30 cm of small bowel.  This was likewise sent to pathology for evaluation. I then reapproximated the proximal small bowel to the distal small bowel at the area of resection with interrupted silk sutures in a side-to-side fashion. I then performed 2 enterotomies and created a side-to-side anastomosis with a GIA-75 stapler. The opening was then closed the [place] 60 stapler. I reinforced the staple line with interrupted silk sutures and closed the mesenteric defect with interrupted 2-0 and 3-0 silk sutures. A wide anastomosis appeared to be achieved which is pink and well perfused. I then copiously irrigated the abdomen with several liters normal saline. I could palpate several large lymph nodes in the retroperitoneum at the pelvis. There was a 2 to 3 cm mass involving a large epiploica of the sigmoid colon. This was on the medial side of the colon. I excised this as well and sent to pathology for evaluation. I found no gross disease in the liver. After irrigating the abdomen saline. Hemostasis peer to be achieved. I closed the patient's midline fascia with a running #1 looped PDS suture. I anesthetized the fascia with Exparel and then packed it with a wet-to-dry saline soaked gauze. Dry gauze was placed over this. The patient tolerated the procedure well. All counts were correct at the end of the procedure. The patient was then extubated in the operating room and taken in a stable condition to the recovery room.          Coralie Keens   Date: 03/24/2020  Time: 12:17 PM

## 2020-03-24 NOTE — Anesthesia Postprocedure Evaluation (Signed)
Anesthesia Post Note  Patient: Jerry Chapman  Procedure(s) Performed: LAPAROSCOPY DIAGNOSTIC (N/A Abdomen) EXPLORATORY LAPAROTOMY (N/A Abdomen) SMALL BOWEL RESECTION (N/A Abdomen) PARTIAL OMENTECTOMY (N/A Abdomen) BIOPSY OF MESENTERIC NODULE (N/A Abdomen) DRAINAGE OF MESENTERIC ABSCESS (N/A Abdomen)     Patient location during evaluation: PACU Anesthesia Type: General Level of consciousness: awake Pain management: pain level controlled Vital Signs Assessment: post-procedure vital signs reviewed and stable Respiratory status: spontaneous breathing Cardiovascular status: stable Postop Assessment: no apparent nausea or vomiting Anesthetic complications: no   No complications documented.  Last Vitals:  Vitals:   03/24/20 1342 03/24/20 1446  BP: (!) 139/87   Pulse: 72   Resp: 18 17  Temp: 36.8 C   SpO2: 94% 96%    Last Pain:  Vitals:   03/24/20 1446  TempSrc:   PainSc: 10-Worst pain ever                 Paige Vanderwoude

## 2020-03-25 ENCOUNTER — Encounter (HOSPITAL_COMMUNITY): Payer: Self-pay | Admitting: Surgery

## 2020-03-25 LAB — BASIC METABOLIC PANEL
Anion gap: 10 (ref 5–15)
BUN: 19 mg/dL (ref 8–23)
CO2: 24 mmol/L (ref 22–32)
Calcium: 8.5 mg/dL — ABNORMAL LOW (ref 8.9–10.3)
Chloride: 103 mmol/L (ref 98–111)
Creatinine, Ser: 1.62 mg/dL — ABNORMAL HIGH (ref 0.61–1.24)
GFR calc Af Amer: 49 mL/min — ABNORMAL LOW (ref >60)
GFR calc non Af Amer: 43 mL/min — ABNORMAL LOW (ref >60)
Glucose, Bld: 136 mg/dL — ABNORMAL HIGH (ref 70–99)
Potassium: 5.2 mmol/L — ABNORMAL HIGH (ref 3.5–5.1)
Sodium: 137 mmol/L (ref 135–145)

## 2020-03-25 LAB — CULTURE, BLOOD (ROUTINE X 2)
Culture: NO GROWTH
Culture: NO GROWTH
Special Requests: ADEQUATE
Special Requests: ADEQUATE

## 2020-03-25 LAB — CBC
HCT: 44.7 % (ref 39.0–52.0)
Hemoglobin: 14.2 g/dL (ref 13.0–17.0)
MCH: 23.2 pg — ABNORMAL LOW (ref 26.0–34.0)
MCHC: 31.8 g/dL (ref 30.0–36.0)
MCV: 73 fL — ABNORMAL LOW (ref 80.0–100.0)
Platelets: 453 10*3/uL — ABNORMAL HIGH (ref 150–400)
RBC: 6.12 MIL/uL — ABNORMAL HIGH (ref 4.22–5.81)
RDW: 16.2 % — ABNORMAL HIGH (ref 11.5–15.5)
WBC: 21.9 10*3/uL — ABNORMAL HIGH (ref 4.0–10.5)
nRBC: 0 % (ref 0.0–0.2)

## 2020-03-25 MED ORDER — CHLORHEXIDINE GLUCONATE CLOTH 2 % EX PADS
6.0000 | MEDICATED_PAD | Freq: Every day | CUTANEOUS | Status: DC
  Administered 2020-03-25 – 2020-03-30 (×4): 6 via TOPICAL

## 2020-03-25 MED ORDER — SODIUM CHLORIDE 0.9 % IV BOLUS
1000.0000 mL | Freq: Once | INTRAVENOUS | Status: AC
  Administered 2020-03-25: 1000 mL via INTRAVENOUS

## 2020-03-25 NOTE — Plan of Care (Signed)
  Problem: Education: Goal: Knowledge of General Education information will improve Description: Including pain rating scale, medication(s)/side effects and non-pharmacologic comfort measures Outcome: Progressing   Problem: Health Behavior/Discharge Planning: Goal: Ability to manage health-related needs will improve Outcome: Progressing   Problem: Clinical Measurements: Goal: Ability to maintain clinical measurements within normal limits will improve Outcome: Progressing Goal: Will remain free from infection Outcome: Progressing Goal: Diagnostic test results will improve Outcome: Progressing Goal: Respiratory complications will improve Outcome: Progressing Goal: Cardiovascular complication will be avoided Outcome: Progressing   Problem: Activity: Goal: Risk for activity intolerance will decrease Outcome: Progressing   Problem: Coping: Goal: Level of anxiety will decrease Outcome: Progressing   Problem: Safety: Goal: Ability to remain free from injury will improve Outcome: Progressing   Problem: Skin Integrity: Goal: Risk for impaired skin integrity will decrease Outcome: Progressing   Problem: Nutrition: Goal: Adequate nutrition will be maintained Outcome: Not Progressing Note: Remains NPO still not passing gas   Problem: Elimination: Goal: Will not experience complications related to bowel motility Outcome: Not Progressing Note: Has yet to pass gas this shift. Goal: Will not experience complications related to urinary retention Outcome: Not Progressing Note: Foley cath continues at this time.   Problem: Pain Managment: Goal: General experience of comfort will improve Outcome: Not Progressing Note: Continues with PCA pump

## 2020-03-25 NOTE — Progress Notes (Signed)
1 Day Post-Op   Subjective/Chief Complaint: Sore but ok no flatus     Objective: Vital signs in last 24 hours: Temp:  [97.2 F (36.2 C)-98.8 F (37.1 C)] 98.8 F (37.1 C) (07/31 0442) Pulse Rate:  [66-78] 66 (07/31 0442) Resp:  [14-20] 18 (07/31 0442) BP: (126-170)/(77-98) 132/77 (07/31 0442) SpO2:  [93 %-98 %] 97 % (07/31 0442) FiO2 (%):  [0 %] 0 % (07/30 1446) Last BM Date: 03/23/20  Intake/Output from previous day: 07/30 0701 - 07/31 0700 In: 2296.5 [P.O.:30; I.V.:2152; IV Piggyback:114.5] Out: 400 [Urine:400] Intake/Output this shift: No intake/output data recorded. General: pleasant, WD,obesemale who is laying in bed in NAD Heart: regular, rate, and rhythm. Palpable radial and pedal pulses bilaterally Lungs: Respiratory effort nonlabored Abd: wound open and clean NGT in place obese  MS: all 4 extremities are symmetrical with no cyanosis, clubbing, or edema. Skin: warm and dry with no masses, lesions, or rashes   Lab Results:  Recent Labs    03/23/20 0052 03/25/20 0235  WBC 15.4* 21.9*  HGB 13.8 14.2  HCT 42.2 44.7  PLT 410* 453*   BMET Recent Labs    03/23/20 0052 03/25/20 0235  NA 138 137  K 4.5 5.2*  CL 104 103  CO2 25 24  GLUCOSE 106* 136*  BUN 11 19  CREATININE 1.09 1.62*  CALCIUM 8.8* 8.5*   PT/INR No results for input(s): LABPROT, INR in the last 72 hours. ABG No results for input(s): PHART, HCO3 in the last 72 hours.  Invalid input(s): PCO2, PO2  Studies/Results: CT ABDOMEN PELVIS W CONTRAST  Result Date: 03/23/2020 CLINICAL DATA:  Possible bowel obstruction or abdominal abscess. EXAM: CT ABDOMEN AND PELVIS WITH CONTRAST TECHNIQUE: Multidetector CT imaging of the abdomen and pelvis was performed using the standard protocol following bolus administration of intravenous contrast. CONTRAST:  149mL OMNIPAQUE IOHEXOL 300 MG/ML  SOLN COMPARISON:  March 19, 2020. FINDINGS: Lower chest: Mild bibasilar subsegmental atelectasis is noted.  Hepatobiliary: No focal liver abnormality is seen. No gallstones, gallbladder wall thickening, or biliary dilatation. Pancreas: Unremarkable. No pancreatic ductal dilatation or surrounding inflammatory changes. Spleen: Normal in size without focal abnormality. Adrenals/Urinary Tract: Adrenal glands appear normal. Stable bilateral renal cysts are noted. No hydronephrosis or renal obstruction is noted. No renal or ureteral calculi are noted. Urinary bladder is unremarkable. Stomach/Bowel: The stomach appears normal. The appendix appears normal. Colon is unremarkable. Significantly increased proximal small bowel dilatation is noted consistent with obstruction, with transition zone seen in the anterior portion of the abdomen best seen on image number 50 of series 3. There is again noted 3.3 x 2.3 cm masslike density adjacent anterior abdominal wall which appears to extend into the adjacent small bowel loop. There is also noted 4.2 x 3.1 cm mass superior to this same bowel lobe which appears to be enlarged compared to prior exam. It is uncertain if this represents neoplasm, infection or possibly infection with adjacent enlarging abscess. Vascular/Lymphatic: Aortic atherosclerosis. No enlarged abdominal or pelvic lymph nodes. Reproductive: Prostate is unremarkable. Other: No abdominal wall hernia. No abdominopelvic ascites. Musculoskeletal: No acute or significant osseous findings. IMPRESSION: 1. Significantly increased proximal small bowel dilatation is noted consistent with worsening small bowel obstruction, with transition zone seen in the anterior portion of the abdomen best seen on image number 50 of series 3. There is again noted 3.3 x 2.3 cm masslike density adjacent to anterior abdominal wall which appears to extend into the adjacent small bowel loop. There is also noted  4.2 x 3.1 cm mass superior to this same bowel loop which appears to be enlarged compared to prior exam. It is uncertain if this represents  neoplasm, infection or possibly infection with adjacent enlarging abscess. Aortic Atherosclerosis (ICD10-I70.0). Electronically Signed   By: Marijo Conception M.D.   On: 03/23/2020 17:54   DG Abd Portable 1V  Result Date: 03/24/2020 CLINICAL DATA:  NG tube placement EXAM: PORTABLE ABDOMEN - 1 VIEW COMPARISON:  None. FINDINGS: Enteric tube passes into the stomach. Bowel gas pattern is not well evaluated. IMPRESSION: Enteric tube within stomach. Electronically Signed   By: Macy Mis M.D.   On: 03/24/2020 14:10    Anti-infectives: Anti-infectives (From admission, onward)   Start     Dose/Rate Route Frequency Ordered Stop   03/23/20 2000  piperacillin-tazobactam (ZOSYN) IVPB 3.375 g     Discontinue     3.375 g 12.5 mL/hr over 240 Minutes Intravenous Every 8 hours 03/23/20 1943     03/20/20 0645  cefTRIAXone (ROCEPHIN) 1 g in sodium chloride 0.9 % 100 mL IVPB        1 g 200 mL/hr over 30 Minutes Intravenous Every 24 hours 03/20/20 0641 03/22/20 1513   03/20/20 0645  metroNIDAZOLE (FLAGYL) IVPB 500 mg        500 mg 100 mL/hr over 60 Minutes Intravenous Every 8 hours 03/20/20 0641 03/22/20 2359      Assessment/Plan: s/p Procedure(s): LAPAROSCOPY DIAGNOSTIC (N/A) EXPLORATORY LAPAROTOMY (N/A) SMALL BOWEL RESECTION (N/A) PARTIAL OMENTECTOMY (N/A) BIOPSY OF MESENTERIC NODULE (N/A) DRAINAGE OF MESENTERIC ABSCESS (N/A) AKI- Cr WORSE bolus 1 L NS - foley for strict I/O  Prostate cancer-radiation therapy Arthritis Gout Hypertension Sleep apnea - tested, but never got the CPAP machine  Abdominal pain with omental infarction Ileus -POD 1 EX LAP SBR - ileus cont NGT ice chips ok- OOB  WET TO DRY DSSG  FEN:IV fluids/CLD, NGT ILWS JK:[id number]/HBZJIR 7/26>> DVT:SQ heparin    LOS: 6 days    Turner Daniels MD  03/25/2020

## 2020-03-25 NOTE — Progress Notes (Signed)
Subjective:  Hospital Day: 6  No overnight events.  Jerry Chapman was evaluated at bedside this morning. He was in good spirits this morning and reports sleeping well only needing to use his PCA morphine pump 3 times for pain. He reports this morning that he feels well with 1/10 pain in his abdomen. He has not yet had a bowel movement or flatus since his surgery yesterday. We discussed the plan going forward to advance his diet and activity level slowly, per surgery.  He denies nausea, vomiting, chest pain, shortness of breath, fever, chills, abdominal pain, and pain in his legs.  Objective: Vital signs in last 24 hours: Vitals:   03/25/20 0800 03/25/20 1012 03/25/20 1208 03/25/20 1401  BP:  (!) 136/81  (!) 141/79  Pulse:  64  66  Resp: 16 17 16 15   Temp:  98.2 F (36.8 C)  98.2 F (36.8 C)  TempSrc:  Oral  Oral  SpO2: 99% 99% 98% 97%  Weight:      Height:       Weight change:   Intake/Output Summary (Last 24 hours) at 03/25/2020 1529 Last data filed at 03/25/2020 1200 Gross per 24 hour  Intake 1596.45 ml  Output 600 ml  Net 996.45 ml   Physical Exam Vitals and nursing note reviewed.  Constitutional: no acute distress Cardiovascular: regular rate and rhythm, normal heart sounds Pulmonary: effort normal, normal breath sounds bilaterally Abdominal:surgical bandage noted over abdomen, no surrounding erythema or warmth Musculoskeletal:no peripheral edema Skin: warm and dry Neurological: alert, no focal deficit Psychiatric: normal mood and affect  Lab Results: CBC Latest Ref Rng & Units 03/25/2020 03/23/2020 03/22/2020  WBC 4.0 - 10.5 K/uL 21.9(H) 15.4(H) 13.9(H)  Hemoglobin 13.0 - 17.0 g/dL 14.2 13.8 13.0  Hematocrit 39 - 52 % 44.7 42.2 40.9  Platelets 150 - 400 K/uL 453(H) 410(H) 373   CMP Latest Ref Rng & Units 03/25/2020 03/23/2020 03/22/2020  Glucose 70 - 99 mg/dL 136(H) 106(H) 95  BUN 8 - 23 mg/dL 19 11 16   Creatinine 0.61 - 1.24 mg/dL 1.62(H) 1.09 1.19  Sodium  135 - 145 mmol/L 137 138 140  Potassium 3.5 - 5.1 mmol/L 5.2(H) 4.5 4.3  Chloride 98 - 111 mmol/L 103 104 104  CO2 22 - 32 mmol/L 24 25 26   Calcium 8.9 - 10.3 mg/dL 8.5(L) 8.8(L) 8.6(L)  Total Protein 6.5 - 8.1 g/dL - - -  Total Bilirubin 0.3 - 1.2 mg/dL - - -  Alkaline Phos 38 - 126 U/L - - -  AST 15 - 41 U/L - - -  ALT 0 - 44 U/L - - -   Assessment/Plan: Principal Problem:   Partial small bowel obstruction w/ ileus Active Problems:   HTN (hypertension)   Adenocarcinoma of prostate (HCC)   Acute kidney injury (HCC)   Abdominal mass   Microscopic hematuria  Jerry Chapman is a 69 year old male with PMH of prostate cancer,HTN and gout here for management for Partial SBO.Repeat CT yesterday showed worsening small bowel dilation and growth of the abdominal mass. Diagnostic laparoscopy performed with excision of several masses and a small portion of small bowel. Pt is stable and doing well since surgery.   #SBO with Ileus 2/2 Omental Infarct vs Paraneoplastic:CT imaging on 7/25 showed left anterior peritoneal mass and suspected partialSBOwith dilation of jejunum.  Repeat imagine on 7/29 showed worsening dilation of the small bowel and growth of the anterior abdominal mass. It is unclear at this time what this mass  is - abscess vs paraneoplastic vs omental infarct.Surgery consulted and performed diagnostic laparoscopy today. Found an omental mass with formation of an abscess. Additionally found a loop of small bowel which was inflammed, unable to to determine whether this was malignant infiltration. This loop of bowel was excised together with the omental mass and sent for pathology. Also found a second mass involving the sigmoid colon which was excised and sent for pathology. Will continue Zosyn for intra-abdominal abscess. Pt is afebrile, well appearing, and has well controlled pain. Will remain NPO until further direction from surgery. - f/u pathology - cont. Zosyn 3.375 g q8h, Day  3 -NPO - Protonix20 mgqd - IVF nml saline 15mL/hr - Pain regimen: Morphine 2mg /mL PCA - Zofran PRN for nausea - Bowel regimen with Dulcolax andMiralax, increase as needed  #AKI:sCr 1.62 from 1.09 prior to surgery (Baseline 1.1). Likely due to surgical stress. Will continue to hydrate and monitor I/O and BMP - IVF nml saline 100 mL/hr - BMP qd  #Leukocytosis: Likely due in part to excised intra-abdominal abscess as well as stress from open abdominal surgery. Remains afebrile and appears to be comfortable and in no distress. Will continue to monitor. -cont. Zosyn 3.375 g q8h, Day 3  #Hx of prostate cancer:s/p Radiation therapy in 2006. Currently under surveillance q 6 months.PSA at 2.2.CEA at 1.1.Pt to f/u with urologist at earliest convenience. -f/u urologist, possible outpatient cystoscopy  #HTN: -start homeamlodipine 10 mg qd  #Gout: - Hold allopurinol, naproxen and colchicine in the setting of AKI  #OSA - CPAP at night  This is a Careers information officer Note.  The care of the patient was discussed with Dr. Koleen Distance and the assessment and plan formulated with their assistance.  Please see their attached note for official documentation of the daily encounter.   LOS: 6 days   Jerry Chapman, Medical Student 03/25/2020, 3:29 PM

## 2020-03-26 LAB — COMPREHENSIVE METABOLIC PANEL
ALT: 22 U/L (ref 0–44)
AST: 22 U/L (ref 15–41)
Albumin: 2.4 g/dL — ABNORMAL LOW (ref 3.5–5.0)
Alkaline Phosphatase: 48 U/L (ref 38–126)
Anion gap: 7 (ref 5–15)
BUN: 19 mg/dL (ref 8–23)
CO2: 27 mmol/L (ref 22–32)
Calcium: 8.3 mg/dL — ABNORMAL LOW (ref 8.9–10.3)
Chloride: 105 mmol/L (ref 98–111)
Creatinine, Ser: 1.44 mg/dL — ABNORMAL HIGH (ref 0.61–1.24)
GFR calc Af Amer: 57 mL/min — ABNORMAL LOW (ref >60)
GFR calc non Af Amer: 49 mL/min — ABNORMAL LOW (ref >60)
Glucose, Bld: 102 mg/dL — ABNORMAL HIGH (ref 70–99)
Potassium: 4.6 mmol/L (ref 3.5–5.1)
Sodium: 139 mmol/L (ref 135–145)
Total Bilirubin: 0.6 mg/dL (ref 0.3–1.2)
Total Protein: 6 g/dL — ABNORMAL LOW (ref 6.5–8.1)

## 2020-03-26 LAB — CBC
HCT: 41.4 % (ref 39.0–52.0)
Hemoglobin: 13.1 g/dL (ref 13.0–17.0)
MCH: 23.5 pg — ABNORMAL LOW (ref 26.0–34.0)
MCHC: 31.6 g/dL (ref 30.0–36.0)
MCV: 74.2 fL — ABNORMAL LOW (ref 80.0–100.0)
Platelets: 432 10*3/uL — ABNORMAL HIGH (ref 150–400)
RBC: 5.58 MIL/uL (ref 4.22–5.81)
RDW: 15.7 % — ABNORMAL HIGH (ref 11.5–15.5)
WBC: 18.9 10*3/uL — ABNORMAL HIGH (ref 4.0–10.5)
nRBC: 0 % (ref 0.0–0.2)

## 2020-03-26 MED ORDER — HYDROMORPHONE HCL 1 MG/ML IJ SOLN: 1.0000 mg | INTRAMUSCULAR | Status: DC | PRN

## 2020-03-26 MED ORDER — LACTATED RINGERS IV SOLN: INTRAVENOUS | Status: DC

## 2020-03-26 MED ORDER — ENOXAPARIN SODIUM 40 MG/0.4ML ~~LOC~~ SOLN
40.0000 mg | SUBCUTANEOUS | Status: DC
  Administered 2020-03-26 – 2020-03-29 (×4): 40 mg via SUBCUTANEOUS
  Filled 2020-03-26 (×4): qty 0.4

## 2020-03-26 NOTE — Progress Notes (Addendum)
   Subjective: No acute events overnight. Has not had flatus or BM yet. NGT clamped this morning which he is tolerating well without nausea or vomiting. Encouraged him to get out of bed as much as possible.   Objective:  Vital signs in last 24 hours: Vitals:   03/26/20 0843 03/26/20 0845 03/26/20 0938 03/26/20 1018  BP:  (!) 155/90 (!) 153/91   Pulse:   64   Resp: 17  18 17   Temp:   98.6 F (37 C)   TempSrc:   Oral   SpO2: 95%  97% 97%  Weight:      Height:       General: awake, alert, lying comfortably in bed HEENT: NGT clamped, 300 cc bilious drainage noted Abd: BS+; soft; wound open without signs of infection   Assessment/Plan:  Principal Problem:   Partial small bowel obstruction w/ ileus Active Problems:   HTN (hypertension)   Adenocarcinoma of prostate ([place])   Acute kidney injury ([place])   Abdominal mass   Microscopic hematuria  Mr. Gauthreaux is a 69 year old male with PMH of prostate cancer,HTN and gout here for management for Partial SBO.Repeat CT yesterday showed worsening small bowel dilation and growth of the abdominal mass. Diagnostic laparoscopy performed with excision of several masses and a small portion of small bowel. Pt is stable and doing well since surgery.  SBO with ileus 2/2 omental infarct  -he is POD 2 from ex lap with small bowel resection  -seems to be recovering well postoperatively; pain is well controlled  -postop ileus continues; NGT with clamping trial today per surgery -encouraged him to get out of bed as much as possible  AKI -related to extensive surgery -creatinine improving with IVF -continue trending   HTN -continue home amlodipine 10 mg   Dispo: Anticipated discharge in approximately 2-3 day(s).   Modena Nunnery D, DO 03/26/2020, 12:19 PM Pager: [phone] After 5pm on weekdays and 1pm on weekends: On Call pager [phone]    Internal Medicine Attending:   I saw and examined the patient. I reviewed the resident's  note and I agree with the resident's findings and plan as documented in the resident's note.  Hospital day #7 for this 69 year old person admitted with a partial bowel obstruction complicated by intra-abdominal abscess now postoperative day #2 from an exploratory laparotomy and washout.  Clinically doing very well this morning, pain is well controlled on PCA.  Midline surgical incision is clean, fascia is closed but skin is left open, packed with gauze, no sign of infection.  He has a Foley catheter in place postoperatively which can be removed today.  He has an NG tube in place which is been clamped since 9 AM and he is having no nausea.  Abdomin does have bowel sounds but has not had any flatus yet.  Encouraged out of bed activity today including walking.  Antibiotics with Zosyn, currently day 3 since the surgery.  He is afebrile, mild leukocytosis at 18, no surgical cultures pending, probably another 1-2 days of empiric antibiotics and then can discontinue.  Lovenox for DVT prophylaxis.  Surgical pathology is pending from the colon and omental masses.

## 2020-03-26 NOTE — Plan of Care (Signed)

## 2020-03-26 NOTE — Progress Notes (Addendum)
2 Days Post-Op   Subjective/Chief Complaint: No flatus  No flatus or BM yet  Objective: Vital signs in last 24 hours: Temp:  [97.6 F (36.4 C)-98.6 F (37 C)] 98.6 F (37 C) (08/01 0938) Pulse Rate:  [63-66] 64 (08/01 0938) Resp:  [15-20] 18 (08/01 0938) BP: (136-155)/(77-91) 153/91 (08/01 0938) SpO2:  [94 %-99 %] 97 % (08/01 0938) Last BM Date: 03/22/20  Intake/Output from previous day: 07/31 0701 - 08/01 0700 In: 1533.4 [I.V.:1406.9; IV Piggyback:126.4] Out: 3050 [Urine:3000; Emesis/NG output:50] Intake/Output this shift: No intake/output data recorded.   No intake/output data recorded. General: pleasant, WD,obesemale who is laying in bed in NAD Heart: regular, rate, and rhythm. Palpable radial and pedal pulses bilaterally Lungs: Respiratory effort nonlabored Abd: wound open and clean NGT in place obese  MS: all 4 extremities are symmetrical with no cyanosis, clubbing, or edema. Skin: warm and dry with no masses, lesions, or rashes Lab Results:  Recent Labs    03/25/20 0235 03/26/20 0334  WBC 21.9* 18.9*  HGB 14.2 13.1  HCT 44.7 41.4  PLT 453* 432*   BMET Recent Labs    03/25/20 0235 03/26/20 0334  NA 137 139  K 5.2* 4.6  CL 103 105  CO2 24 27  GLUCOSE 136* 102*  BUN 19 19  CREATININE 1.62* 1.44*  CALCIUM 8.5* 8.3*   PT/INR No results for input(s): LABPROT, INR in the last 72 hours. ABG No results for input(s): PHART, HCO3 in the last 72 hours.  Invalid input(s): PCO2, PO2  Studies/Results: DG Abd Portable 1V  Result Date: 03/24/2020 CLINICAL DATA:  NG tube placement EXAM: PORTABLE ABDOMEN - 1 VIEW COMPARISON:  None. FINDINGS: Enteric tube passes into the stomach. Bowel gas pattern is not well evaluated. IMPRESSION: Enteric tube within stomach. Electronically Signed   By: Macy Mis M.D.   On: 03/24/2020 14:10    Anti-infectives: Anti-infectives (From admission, onward)   Start     Dose/Rate Route Frequency Ordered Stop   03/23/20 2000   piperacillin-tazobactam (ZOSYN) IVPB 3.375 g     Discontinue     3.375 g 12.5 mL/hr over 240 Minutes Intravenous Every 8 hours 03/23/20 1943     03/20/20 0645  cefTRIAXone (ROCEPHIN) 1 g in sodium chloride 0.9 % 100 mL IVPB        1 g 200 mL/hr over 30 Minutes Intravenous Every 24 hours 03/20/20 0641 03/22/20 1513   03/20/20 0645  metroNIDAZOLE (FLAGYL) IVPB 500 mg        500 mg 100 mL/hr over 60 Minutes Intravenous Every 8 hours 03/20/20 0641 03/22/20 2359      Assessment/Plan: s/p Procedure(s): LAPAROSCOPY DIAGNOSTIC (N/A) EXPLORATORY LAPAROTOMY (N/A) SMALL BOWEL RESECTION (N/A) PARTIAL OMENTECTOMY (N/A) BIOPSY OF MESENTERIC NODULE (N/A) DRAINAGE OF MESENTERIC ABSCESS (N/A)    AKI- CrBETTER WITH  bolus 1 L NS - dc foley  Prostate cancer-radiation therapy Arthritis Gout Hypertension Sleep apnea - tested, but never got the CPAP machine  Abdominal pain with omental infarction Ileus -POD 2 EX LAP SBR - ileus cont NGT but clamp today  ice chips ok- OOB  WET TO DRY DSSG  FEN:IV fluids/CLD, NGT clamp YI:[id number]/[id number] 7/26>> DVT:SQ heparin  LOS: 7 days    Turner Daniels MD  03/26/2020

## 2020-03-26 NOTE — Progress Notes (Signed)
Pharmacy Antibiotic Note  Jerry Chapman is a 69 y.o. male admitted on 03/18/2020 with abdominal pain with omental infarction, ileus.  Pt received a course of ceftriaxone/Flagyl from 7/26-7/28. Per provider note, CT scan showed worsening SBO, enlarging mass (possible abscess). Pharmacy has been consulted for Zosyn dosing for intraabdominal infection.  Patient is now s/p ex lap on 7/30. WBC improving from 21.9 to 18.9, afebrile, SCr 1.44 (downfrom 1.85 on admission), CrCl ~58 ml/min  Plan: - Continue zosyn 3.375 gm IV Q 8 hrs (extended infusion) - Monitor WBC, temp, clinical improvement, cultures, renal function, length of therapy  Total Body Weight: 103.9 kg Height: 69 I nches  Temp (24hrs), Avg:98.1 F (36.7 C), Min:97.6 F (36.4 C), Max:98.6 F (37 C)  Recent Labs  Lab 03/20/20 0423 03/20/20 0741 03/20/20 0855 03/21/20 0704 03/22/20 0523 03/23/20 0052 03/25/20 0235 03/26/20 0334  WBC   < >  --   --  13.6* 13.9* 15.4* 21.9* 18.9*  CREATININE   < >  --   --  1.36* 1.19 1.09 1.62* 1.44*  LATICACIDVEN  --  1.5 1.4  --   --   --   --   --    < > = values in this interval not displayed.    Estimated Creatinine Clearance: 57.5 mL/min (A) (by C-G formula based on SCr of 1.44 mg/dL (H)).    No Known Allergies  Antimicrobials this admission: 7/26-7/28 ceftriaxone, metronidazole  Microbiology results: 7/26 BCx x2: negative 7/25 COVID, HIV screen: negative      Ailish Prospero L. Devin Going, [place] PGY2 Pharmacy Resident [phone] 03/26/20      12:48 PM  Please check AMION for all [place] phone numbers After 10:00 PM, call the [facility] [phone]

## 2020-03-27 LAB — BASIC METABOLIC PANEL
Anion gap: 7 (ref 5–15)
BUN: 17 mg/dL (ref 8–23)
CO2: 22 mmol/L (ref 22–32)
Calcium: 8.2 mg/dL — ABNORMAL LOW (ref 8.9–10.3)
Chloride: 106 mmol/L (ref 98–111)
Creatinine, Ser: 1.16 mg/dL (ref 0.61–1.24)
GFR calc Af Amer: >60 mL/min (ref >60)
GFR calc non Af Amer: >60 mL/min (ref >60)
Glucose, Bld: 96 mg/dL (ref 70–99)
Potassium: 4.1 mmol/L (ref 3.5–5.1)
Sodium: 135 mmol/L (ref 135–145)

## 2020-03-27 LAB — CBC
HCT: 41.4 % (ref 39.0–52.0)
Hemoglobin: 13.1 g/dL (ref 13.0–17.0)
MCH: 22.9 pg — ABNORMAL LOW (ref 26.0–34.0)
MCHC: 31.6 g/dL (ref 30.0–36.0)
MCV: 72.5 fL — ABNORMAL LOW (ref 80.0–100.0)
Platelets: 462 10*3/uL — ABNORMAL HIGH (ref 150–400)
RBC: 5.71 MIL/uL (ref 4.22–5.81)
RDW: 15.5 % (ref 11.5–15.5)
WBC: 17.8 10*3/uL — ABNORMAL HIGH (ref 4.0–10.5)
nRBC: 0 % (ref 0.0–0.2)

## 2020-03-27 LAB — SURGICAL PATHOLOGY

## 2020-03-27 MED ORDER — HYDROMORPHONE HCL 1 MG/ML IJ SOLN: 1.0000 mg | INTRAMUSCULAR | Status: DC | PRN

## 2020-03-27 NOTE — Progress Notes (Signed)
   Subjective:   NO acute events overnight. This morning, patient states he "feel good, I don't have much pain." States he only feels pain when he ambulates. Pain level has been 1 or 2 out of 10. Reports yesterday he got up and sat in the chair for 4 hours. Denies nausea, vomiting, SOB, or CP. States he has not passed gas or had a BM but has felt the sensation of gas in his abdomen.  Objective:  Vital signs in last 24 hours: Vitals:   03/26/20 1354 03/26/20 1643 03/26/20 2147 03/27/20 0451  BP: (!) 151/81 (!) 161/90 (!) 157/90 (!) 155/94  Pulse: 68 76 72 70  Resp: 18 18 18 19   Temp: 98.5 F (36.9 C) 98.5 F (36.9 C) 98.8 F (37.1 C) 98.2 F (36.8 C)  TempSrc:   Oral Oral  SpO2: 96% 96% 95% 96%  Weight:      Height:       Physical Exam General: Pleasant man laying in bed, awake and alert, in no acute distress HENT: NGT clamped, ~1000 cc bilious drainage noted Pulmonary: No increased WOB. Mild bilateral crackles at the lung bases. No wheezing. CV: RRR. No murmurs or gallops. No lower extremity edema Abd: Hypoactive bowel sounds. Moderate abdominal distention with tympany, No ttp of abdomen. Midline surgical incision is clean, fascia is closed and packed with gauze. No sign of infection. GU: No foley   Assessment/Plan:  Principal Problem:   Partial small bowel obstruction w/ ileus Active Problems:   HTN (hypertension)   Adenocarcinoma of prostate (HCC)   Acute kidney injury ([place])   Abdominal mass   Microscopic hematuria  Mr. Morera is a 69 year old male with PMH of prostate cancer s/p radiation in 06),HTN and gout here for management for Partial SBO.Repeat CT yesterday showed worsening small bowel dilation and growth of the abdominal mass. Diagnostic laparoscopyperformed with excision of several masses and a small portion of small bowel. Pt is stable and doing well postoperatively.  SBO with ileus 2/2 omental infarct  POD #3 from ex lap with small bowel resection.  Recovering well with minimal pain postoperatively. Exam shows distended abdomen with hypoactive bowel sounds. No flatulence or BM yet. NG tube clamped yesterday. --Pending Surg path --BCx negx2 --Encouraged movement in bed and walking  --Pulmonary toilet --Surgery following, appreciate recs  AKI Likely 2/2 extensive surgery and dehydration. Making urine appropriately. sCr downtrending (1.44 ->1.16). S/p bolus 1 L NS  --Daily BMP  Leukocytosis w/ left shift Reactive in nature and likely 2/2 to possible intestinal abscess. Has been afebrile x8 days. WBC downtrending (18.9-->17.8) --Monitor vitals --Daily CBC --Continue zosyn for 1-2 days (Day 5)  HTN --Continue home amlodipine 10 mg   Gout --Holding allopurinol  Anticipated Discharge Location: Home Barriers to Discharge: None Dispo: Anticipated discharge in approximately 2-3 day(s).   Lacinda Axon, MD 03/27/2020, 8:04 AM [phone] Internal Medicine Teaching Service  After 5pm on weekdays and 1pm on weekends: On Call pager [phone]

## 2020-03-27 NOTE — Progress Notes (Addendum)
3 Days Post-Op  Subjective: CC: Doing well. Has some pain at the top of his midline wound with movement but otherwise only 1-2/10 pain while in bed. Some bloating/distension. No flatus or BM. NGT in place. Mobilizing. Foley out yesterday. Voiding without difficulty. Had some ice chips yesterday.   Objective: Vital signs in last 24 hours: Temp:  [98.2 F (36.8 C)-98.8 F (37.1 C)] 98.2 F (36.8 C) (08/02 0451) Pulse Rate:  [68-76] 70 (08/02 0451) Resp:  [17-19] 19 (08/02 0451) BP: (151-161)/(81-94) 155/94 (08/02 0451) SpO2:  [95 %-97 %] 96 % (08/02 0451) Last BM Date: 03/22/20  Intake/Output from previous day: 08/01 0701 - 08/02 0700 In: 1136.7 [I.V.:948.3; NG/GT:30; IV Piggyback:158.4] Out: 2175 [Urine:1200; Emesis/NG output:975] Intake/Output this shift: No intake/output data recorded.  PE: Gen:  Alert, NAD, pleasant Pulm: Normal rate and effort  Abd: Soft, mild distension, some left sided tenderness without peritonitis. Hypoactive BS. NGT in place. Bilious output in cannister (975/24 hours). Midline wound with healthy granulation tissue at the base. No signs of infection.  Ext:  No LE edema. SCDs in place Psych: A&Ox3  Skin: no rashes noted, warm and dry  Lab Results:  Recent Labs    03/26/20 0334 03/27/20 0131  WBC 18.9* 17.8*  HGB 13.1 13.1  HCT 41.4 41.4  PLT 432* 462*   BMET Recent Labs    03/26/20 0334 03/27/20 0131  NA 139 135  K 4.6 4.1  CL 105 106  CO2 27 22  GLUCOSE 102* 96  BUN 19 17  CREATININE 1.44* 1.16  CALCIUM 8.3* 8.2*   PT/INR No results for input(s): LABPROT, INR in the last 72 hours. CMP     Component Value Date/Time   NA 135 03/27/2020 0131   NA 140 12/08/2019 1436   K 4.1 03/27/2020 0131   CL 106 03/27/2020 0131   CO2 22 03/27/2020 0131   GLUCOSE 96 03/27/2020 0131   BUN 17 03/27/2020 0131   BUN 18 12/08/2019 1436   CREATININE 1.16 03/27/2020 0131   CREATININE 1.31 (H) 07/16/2015 1539   CALCIUM 8.2 (L) 03/27/2020 0131    PROT 6.0 (L) 03/26/2020 0334   PROT 7.8 12/08/2019 1436   ALBUMIN 2.4 (L) 03/26/2020 0334   ALBUMIN 4.7 12/08/2019 1436   AST 22 03/26/2020 0334   ALT 22 03/26/2020 0334   ALKPHOS 48 03/26/2020 0334   BILITOT 0.6 03/26/2020 0334   BILITOT 0.4 12/08/2019 1436   GFRNONAA >60 03/27/2020 0131   GFRNONAA 64 11/07/2014 1047   GFRAA >60 03/27/2020 0131   GFRAA 74 11/07/2014 1047   Lipase     Component Value Date/Time   LIPASE 25 03/18/2020 1954       Studies/Results: No results found.  Anti-infectives: Anti-infectives (From admission, onward)   Start     Dose/Rate Route Frequency Ordered Stop   03/23/20 2000  piperacillin-tazobactam (ZOSYN) IVPB 3.375 g     Discontinue     3.375 g 12.5 mL/hr over 240 Minutes Intravenous Every 8 hours 03/23/20 1943     03/20/20 0645  cefTRIAXone (ROCEPHIN) 1 g in sodium chloride 0.9 % 100 mL IVPB        1 g 200 mL/hr over 30 Minutes Intravenous Every 24 hours 03/20/20 0641 03/22/20 1513   03/20/20 0645  metroNIDAZOLE (FLAGYL) IVPB 500 mg        500 mg 100 mL/hr over 60 Minutes Intravenous Every 8 hours 03/20/20 0641 03/22/20 2359       Assessment/Plan  AKI- resolved  Prostate cancer-radiation therapy Arthritis Gout Hypertension Sleep apnea - tested, but never got the CPAP machine  Small Bowel Obstruction, mesenteric abscess, possible malignancy S/p Diagnostic laparoscopy, exploratory laparotomy, SBR, partial omentectomy, excisional biopsy of mesenteric nodule and drainage of mesenteric abscess - Patient with ileus post op. Less pain and distension. Clamp NGT. Keep K > 4 and Mg > 2 for bowel function. Mobilize for bowel function - Await Path  - BID WTD - Pulm toilet   FEN:NPO, NGT (clamp) UY:[id number]/VKFMMC 7/26 - 7/28. Zosyn 7/29 >> WBC downtrending at 17.8. Afebrile  RFV:OHKG, Lovenox  Foley - None Follow-Up - Dr. Ninfa Linden    LOS: 8 days    Jillyn Ledger , [facility] Surgery 03/27/2020, 9:52  AM Please see Amion for pager number during day hours 7:00am-4:30pm

## 2020-03-27 NOTE — Plan of Care (Signed)

## 2020-03-27 NOTE — Discharge Summary (Signed)
Name: Jerry Chapman MRN: [id number] DOB: 1950/09/25 69 y.o. PCP: Forrest Moron, MD  Date of Admission: 03/18/2020  7:47 PM Date of Discharge:  Attending Physician: Angelica Pou, MD  Discharge Diagnosis: Small Bowel Obstruction w/ Ileus   Discharge Medications: Allergies as of 03/30/2020   No Known Allergies     Medication List    STOP taking these medications   ketoconazole 2 % shampoo Commonly known as: NIZORAL     TAKE these medications   acetaminophen 500 MG tablet Commonly known as: TYLENOL Take 2 tablets (1,000 mg total) by mouth every 8 (eight) hours as needed for mild pain.   allopurinol 300 MG tablet Commonly known as: ZYLOPRIM Take 1 tablet (300 mg total) by mouth daily.   amLODipine 10 MG tablet Commonly known as: NORVASC TAKE 1 TABLET(10 MG) BY MOUTH DAILY What changed: See the new instructions.   Colchicine 0.6 MG Caps Commonly known as: Mitigare Take 1 capsule by mouth daily. Dispense as Mitigare.   docusate sodium 100 MG capsule Commonly known as: Colace Take 1 capsule (100 mg total) by mouth 2 (two) times daily as needed for mild constipation.   naproxen 500 MG tablet Commonly known as: NAPROSYN TAKE 1 TABLET(500 MG) BY MOUTH EVERY DAY WITH A MEAL What changed:   how much to take  how to take this  when to take this  additional instructions   ondansetron 4 MG tablet Commonly known as: ZOFRAN Take 1 tablet (4 mg total) by mouth every 6 (six) hours.   oxyCODONE 5 MG immediate release tablet Commonly known as: Oxy IR/ROXICODONE Take 1 tablet (5 mg total) by mouth every 6 (six) hours as needed for breakthrough pain.            Discharge Care Instructions  (From admission, onward)         Start     Ordered   03/30/20 0000  Discharge wound care:        03/30/20 1049           Disposition and follow-up:   Mr.Jerry Chapman was discharged from [facility] in Stable condition.  At the  hospital follow up visit please address:  1.  Abdominal incision wound, pain  2.  Labs / imaging needed at time of follow-up: CBC  3.  Pending labs/ test needing follow-up: None  Follow-up Appointments:  Follow-up Information    Coralie Keens, MD. Go on 04/11/2020.   Specialty: General Surgery Why: at 9:50 AM for post-operative follow up. please arrive by 9:30 AM to get checked in and fill out any necessary paperwork. Contact information: [address] [place] [address] [phone]               Hospital Course by problem list: 1. Partial SBO Patient present to the hospital on 7/25 for increasing abdominal pain and distention. Patient was found to have small bowel obstruction with ileus secondary to peritoneal mass discovered on CT abdomen and pelvis. Mass measures 2.8 x 3.4 x 4.1 cm which was initially thought to be an omental infarct or malignancy. CEA tumor marker was obtained and came back normal. NG tube was then placed to decompress the bowels and proceeded with Gastrografin test which shows partial small bowel obstruction. On 7/30, patient was taken to the operating room for a diagnostic laparoscopy, exploratory laparotomy, small bowel resection, partial omentectomy, excisional biopsy of mesenteric nodule and drainage. The surgical pathology showed omental  mass and small bowel specimen with active inflammation and necrosis consistent with abscess. Patient continued to make progress after surgery and on POD#6, patient was discharged home in stable condition with outpatient follow up.   Leukocytosis White blood count on admission was 14.7 which increased to 16.7 the next day. Patient also had a fever of 101 the morning after admission. Lactic acid obtain and came back normal at 1.5.  Chest x-ray was unremarkable. Blood culture was obtained which did not growth any organism. IV ceftriaxone and metronidazole were started for 3 days and finished. He was then placed  on IV Zosyn for 8 days until discharge. Patient was afebrile and clinically well. White count improved to 13.7 at discharge.  AKI Secondary to dehydration and surgical stress. Max sCr on admission was 1.62, which improved to 1.25 on discharge after IVF hydration.  History of prostate cancer Hematuria Received seed therapy in 2006 and under surveillance every 6 months. PSA normal in August 2020 (2.4). Repeat PSA due to small hemoglobin in UA was 2.2. Patient will continue to follow-up with his urologist after discharge.  Discharge Vitals:   BP (!) 155/94 (BP Location: Left Arm)   Pulse 70   Temp 98.2 F (36.8 C) (Oral)   Resp 19   Ht 5\' 9"  (1.753 m)   Wt (!) 103.9 kg   SpO2 96%   BMI 33.83 kg/m   Pertinent Labs, Studies, and Procedures:  CBC Latest Ref Rng & Units 03/30/2020 03/29/2020 03/28/2020  WBC 4.0 - 10.5 K/uL 13.7(H) 17.8(H) 18.6(H)  Hemoglobin 13.0 - 17.0 g/dL 13.1 12.9(L) 13.9  Hematocrit 39 - 52 % 40.8 40.5 43.0  Platelets 150 - 400 K/uL 568(H) 555(H) 544(H)    Discharge Instructions:  Mr. Jerry Chapman, It was a pleasure taking care of you. I hope you continue to recover well from surgery.  Please continue the wound care instructions as discussed with the surgery team, as well as advancing your diet slowly based on their instructions.  You will follow-up with the surgeon on 8/17.  Please also follow-up with your primary care physician in 2 weeks.   Take care!   Signed: Lacinda Axon, MD 03/27/2020, 11:51 AM   Pager: [phone] Internal Medicine Teaching Service

## 2020-03-28 DIAGNOSIS — K56600 Partial intestinal obstruction, unspecified as to cause: Secondary | ICD-10-CM

## 2020-03-28 LAB — BASIC METABOLIC PANEL
Anion gap: 10 (ref 5–15)
BUN: 16 mg/dL (ref 8–23)
CO2: 23 mmol/L (ref 22–32)
Calcium: 8.6 mg/dL — ABNORMAL LOW (ref 8.9–10.3)
Chloride: 102 mmol/L (ref 98–111)
Creatinine, Ser: 1.19 mg/dL (ref 0.61–1.24)
GFR calc Af Amer: >60 mL/min (ref >60)
GFR calc non Af Amer: >60 mL/min (ref >60)
Glucose, Bld: 97 mg/dL (ref 70–99)
Potassium: 3.9 mmol/L (ref 3.5–5.1)
Sodium: 135 mmol/L (ref 135–145)

## 2020-03-28 LAB — CBC
HCT: 43 % (ref 39.0–52.0)
Hemoglobin: 13.9 g/dL (ref 13.0–17.0)
MCH: 23.5 pg — ABNORMAL LOW (ref 26.0–34.0)
MCHC: 32.3 g/dL (ref 30.0–36.0)
MCV: 72.6 fL — ABNORMAL LOW (ref 80.0–100.0)
Platelets: 544 10*3/uL — ABNORMAL HIGH (ref 150–400)
RBC: 5.92 MIL/uL — ABNORMAL HIGH (ref 4.22–5.81)
RDW: 15.3 % (ref 11.5–15.5)
WBC: 18.6 10*3/uL — ABNORMAL HIGH (ref 4.0–10.5)
nRBC: 0 % (ref 0.0–0.2)

## 2020-03-28 LAB — MAGNESIUM: Magnesium: 2.3 mg/dL (ref 1.7–2.4)

## 2020-03-28 NOTE — Progress Notes (Signed)
   Subjective:   NAEON.  Patient evaluated at bedside. He reports he passed gas 3x today. He states he is very happy with the pathology results. He denies n/v, back, bottom, or foot soreness. He reports surgery told him the NGT can now be removed. Surgeon said wound looks good. Patient states he has been sitting up in bed most of the time and has walked around the room without incident.  Objective:  Vital signs in last 24 hours: Vitals:   03/27/20 0451 03/27/20 1434 03/27/20 2049 03/28/20 0348  BP: (!) 155/94 (!) 169/98 (!) 155/94 (!) 141/91  Pulse: 70 72 70 66  Resp: 19 18 17 16   Temp: 98.2 F (36.8 C) 98.9 F (37.2 C) 98.2 F (36.8 C) 98.3 F (36.8 C)  TempSrc: Oral  Oral Oral  SpO2: 96% 98% 97% 96%  Weight:      Height:       Physical Exam General: Pleasant man laying in bed, conversational, awake and alert, in no acute distress HENT: NGT removed, 250 mL bilious drainage noted Pulmonary: No increased WOB. Lungs CTAB. No wheezing. IS performed at bedside to a capacity of 2500 mL CV: RRR. No murmurs or gallops. No lower extremity edema Abd: Hypoactive bowel sounds. Mild abdominal distention with tympany, improved from previous day. No ttp of abdomen. Midline surgical incision is clean, fascia is closed and packed with gauze. No sign of infection. GU: No foley   Assessment/Plan:  Principal Problem:   Partial small bowel obstruction w/ ileus Active Problems:   HTN (hypertension)   Adenocarcinoma of prostate (HCC)   Acute kidney injury ([place])   Abdominal mass   Microscopic hematuria  Mr. Korb is a 69 year old male with PMH of prostate cancer s/p radiation in 06),HTN and gout here for management for Partial SBO.Repeat CT yesterday showed worsening small bowel dilation and growth of the abdominal mass. Diagnostic laparoscopyperformed with excision of several masses and a small portion of small bowel. Pt is stable and doing well postoperatively.  SBO with ileus 2/2  omental infarct  POD #4 from ex lap with small bowel resection. Recovering well with minimal pain postoperatively. Distended abdomen improving. Still with hypoactive bowel sounds. Flatulence x3 today. Still no BM. NG tube removed today per surgery recs --Surg path shows omental mass and small bowel specimen with active inflammation and necrosis consistent with abscess. --No evidence of malignancy --BCx negx2 --Encouraged movement in bed and walking  --Continue pulmonary toilet --Surgery following, appreciate recs  AKI, Resolved Likely 2/2 extensive surgery and dehydration. Making urine appropriately. sCr is at baseline (1.19). S/p bolus 1 L NS  --Daily BMP  Leukocytosis w/ left shift Reactive in nature and likely 2/2 to intestinal abscess. Slight increase in WBC (17.8 -->18.6), but remains afebrile. --Monitor vitals --Daily CBC --Continue zosyn for 1-2 days (Day 6) --S/p Rocephin/flagyl for 3 days (7/26-7/28)  Thrombocytosis Platelets elevated to max of 544. Likely reactive 2/2 to patient's intra-abdominal abscess and neutrophilia.  --Check CBC with diff  HTN --Continue home amlodipine 10 mg   Gout --Holding allopurinol  Anticipated Discharge Location: Home Barriers to Discharge: None Dispo: Anticipated discharge in approximately 2-3 day(s).   Lacinda Axon, MD 03/28/2020, 7:37 AM [phone] Internal Medicine Teaching Service  After 5pm on weekdays and 1pm on weekends: On Call pager [phone]

## 2020-03-28 NOTE — Progress Notes (Signed)
4 Days Post-Op  Subjective: CC: Doing well since NGT clamped. No abdominal bloating, n/v. He continues to pass flatus. No BM. Some abdominal soreness around midline wound but denies pain. Mobilizing.   Objective: Vital signs in last 24 hours: Temp:  [98.2 F (36.8 C)-98.9 F (37.2 C)] 98.3 F (36.8 C) (08/03 0348) Pulse Rate:  [66-72] 66 (08/03 0348) Resp:  [16-18] 16 (08/03 0348) BP: (141-169)/(91-98) 141/91 (08/03 0348) SpO2:  [96 %-98 %] 96 % (08/03 0348) Last BM Date: 03/22/20  Intake/Output from previous day: 08/02 0701 - 08/03 0700 In: 20 [P.O.:20] Out: 1275 [Urine:1025; Emesis/NG output:250] Intake/Output this shift: No intake/output data recorded.  PE: Gen:  Alert, NAD, pleasant Pulm: Normal rate and effort  Abd: Soft, mild distension, left sided tenderness from yesterday has now resolved. Only tenderness is over/just adjacent to midline wound and appropriate. Otherwise NT and without peritonitis. +BS. NGT in place (clamped). Midline wound with healthy granulation tissue at the base. No signs of infection.  Ext:  No LE edema Psych: A&Ox3  Skin: no rashes noted, warm and dry   Lab Results:  Recent Labs    03/27/20 0131 03/28/20 0152  WBC 17.8* 18.6*  HGB 13.1 13.9  HCT 41.4 43.0  PLT 462* 544*   BMET Recent Labs    03/27/20 0131 03/28/20 0152  NA 135 135  K 4.1 3.9  CL 106 102  CO2 22 23  GLUCOSE 96 97  BUN 17 16  CREATININE 1.16 1.19  CALCIUM 8.2* 8.6*   PT/INR No results for input(s): LABPROT, INR in the last 72 hours. CMP     Component Value Date/Time   NA 135 03/28/2020 0152   NA 140 12/08/2019 1436   K 3.9 03/28/2020 0152   CL 102 03/28/2020 0152   CO2 23 03/28/2020 0152   GLUCOSE 97 03/28/2020 0152   BUN 16 03/28/2020 0152   BUN 18 12/08/2019 1436   CREATININE 1.19 03/28/2020 0152   CREATININE 1.31 (H) 07/16/2015 1539   CALCIUM 8.6 (L) 03/28/2020 0152   PROT 6.0 (L) 03/26/2020 0334   PROT 7.8 12/08/2019 1436   ALBUMIN 2.4  (L) 03/26/2020 0334   ALBUMIN 4.7 12/08/2019 1436   AST 22 03/26/2020 0334   ALT 22 03/26/2020 0334   ALKPHOS 48 03/26/2020 0334   BILITOT 0.6 03/26/2020 0334   BILITOT 0.4 12/08/2019 1436   GFRNONAA >60 03/28/2020 0152   GFRNONAA 64 11/07/2014 1047   GFRAA >60 03/28/2020 0152   GFRAA 74 11/07/2014 1047   Lipase     Component Value Date/Time   LIPASE 25 03/18/2020 1954       Studies/Results: No results found.  Anti-infectives: Anti-infectives (From admission, onward)   Start     Dose/Rate Route Frequency Ordered Stop   03/23/20 2000  piperacillin-tazobactam (ZOSYN) IVPB 3.375 g     Discontinue     3.375 g 12.5 mL/hr over 240 Minutes Intravenous Every 8 hours 03/23/20 1943     03/20/20 0645  cefTRIAXone (ROCEPHIN) 1 g in sodium chloride 0.9 % 100 mL IVPB        1 g 200 mL/hr over 30 Minutes Intravenous Every 24 hours 03/20/20 0641 03/22/20 1513   03/20/20 0645  metroNIDAZOLE (FLAGYL) IVPB 500 mg        500 mg 100 mL/hr over 60 Minutes Intravenous Every 8 hours 03/20/20 0641 03/22/20 2359       Assessment/Plan AKI- resolved  Prostate cancer-radiation therapy Arthritis Gout Hypertension Sleep apnea -  tested, but never got the CPAP machine  Small Bowel Obstruction, mesenteric abscess, possiblemalignancy - s/p Diagnostic laparoscopy, exploratory laparotomy, SBR, partial omentectomy, excisional biopsy of mesenteric nodule and drainage of mesenteric abscess by Dr. Ninfa Linden on 03/24/2020 - POD #4 - d/c NGT. CLD - Path without signs of malignancy - BID WTD - Pulm toilet  - Monitor WBC. Cont IV abx   FEN:CLD YI:[id number]/MOLMBE 7/26 - 7/28. Zosyn 7/29 >> WBC 18.6. Afebrile  MLJ:QGBE, Lovenox  Foley - None Follow-Up - Dr. Ninfa Linden     LOS: 9 days    Jillyn Ledger , [facility] Surgery 03/28/2020, 7:53 AM Please see Amion for pager number during day hours 7:00am-4:30pm

## 2020-03-29 LAB — CBC
HCT: 40.5 % (ref 39.0–52.0)
Hemoglobin: 12.9 g/dL — ABNORMAL LOW (ref 13.0–17.0)
MCH: 23 pg — ABNORMAL LOW (ref 26.0–34.0)
MCHC: 31.9 g/dL (ref 30.0–36.0)
MCV: 72.2 fL — ABNORMAL LOW (ref 80.0–100.0)
Platelets: 555 K/uL — ABNORMAL HIGH (ref 150–400)
RBC: 5.61 MIL/uL (ref 4.22–5.81)
RDW: 15.4 % (ref 11.5–15.5)
WBC: 17.8 K/uL — ABNORMAL HIGH (ref 4.0–10.5)
nRBC: 0 % (ref 0.0–0.2)

## 2020-03-29 LAB — BASIC METABOLIC PANEL WITH GFR
Anion gap: 8 (ref 5–15)
BUN: 16 mg/dL (ref 8–23)
CO2: 24 mmol/L (ref 22–32)
Calcium: 8.6 mg/dL — ABNORMAL LOW (ref 8.9–10.3)
Chloride: 105 mmol/L (ref 98–111)
Creatinine, Ser: 1.29 mg/dL — ABNORMAL HIGH (ref 0.61–1.24)
GFR calc Af Amer: >60 mL/min
GFR calc non Af Amer: 56 mL/min — ABNORMAL LOW
Glucose, Bld: 104 mg/dL — ABNORMAL HIGH (ref 70–99)
Potassium: 3.9 mmol/L (ref 3.5–5.1)
Sodium: 137 mmol/L (ref 135–145)

## 2020-03-29 MED ORDER — DOCUSATE SODIUM 100 MG PO CAPS
100.0000 mg | ORAL_CAPSULE | Freq: Two times a day (BID) | ORAL | Status: DC
  Administered 2020-03-29 (×2): 100 mg via ORAL
  Filled 2020-03-29 (×3): qty 1

## 2020-03-29 MED ORDER — SODIUM CHLORIDE 0.9 % IV SOLN: INTRAVENOUS | Status: DC

## 2020-03-29 MED ORDER — POLYETHYLENE GLYCOL 3350 17 G PO PACK
17.0000 g | PACK | Freq: Every day | ORAL | Status: DC
  Administered 2020-03-29: 17 g via ORAL
  Filled 2020-03-29 (×2): qty 1

## 2020-03-29 MED ORDER — OXYCODONE HCL 5 MG PO TABS
5.0000 mg | ORAL_TABLET | ORAL | Status: DC | PRN
  Administered 2020-03-29: 10 mg via ORAL
  Administered 2020-03-30: 5 mg via ORAL
  Filled 2020-03-29: qty 1
  Filled 2020-03-29: qty 2

## 2020-03-29 MED ORDER — HYDROMORPHONE HCL 1 MG/ML IJ SOLN: 1.0000 mg | INTRAMUSCULAR | Status: DC | PRN

## 2020-03-29 NOTE — Progress Notes (Signed)
Pharmacy Antibiotic Note  Jerry Chapman is a 69 y.o. male admitted on 03/18/2020 with abdominal pain with omental infarction and ileus. Now s/p small bowel resection 7/30, surgical pathology shows omental mass and small bowel specimen with active inflammation and necrosis consistent with abscess. Pharmacy has been consulted for Zosyn dosing for intraabdominal infection.  Today, WBC elevated but relatively stable from previous at 17.8, afebrile. SCr slightly up from baseline  Plan: Continue zosyn 3.375 gm IV Q 8 hrs (extended infusion) Monitor clinical picture, renal function F/U C&S, abx de-escalation, LOT  Temp (24hrs), Avg:98.1 F (36.7 C), Min:97.3 F (36.3 C), Max:98.7 F (37.1 C)  Recent Labs  Lab 03/25/20 0235 03/26/20 0334 03/27/20 0131 03/28/20 0152 03/29/20 0207  WBC 21.9* 18.9* 17.8* 18.6* 17.8*  CREATININE 1.62* 1.44* 1.16 1.19 1.29*    Estimated Creatinine Clearance: 64.2 mL/min (A) (by C-G formula based on SCr of 1.29 mg/dL (H)).    No Known Allergies  Antimicrobials this admission: 7/26-7/28 ceftriaxone, metronidazole 7/29 Zosyn>  Microbiology results: 7/26 BCx x2: negative    Brendolyn Patty, PharmD Clinical Pharmacist  03/29/2020   12:52 PM   Please check AMION for all [facility] phone numbers After 10:00 PM, call the [facility] [phone]

## 2020-03-29 NOTE — Progress Notes (Addendum)
5 Days Post-Op  Subjective: CC: Doing well. No abdominal pain at rest. Some soreness of midline when he tries to get out of bed. No n/v. Tolerating cld. Did have some indegestion/burping and belching with broth but otherwise no burping/belching with other cld. Passing flatus. No BM. Mobilizing.   Objective: Vital signs in last 24 hours: Temp:  [97.3 F (36.3 C)-98.7 F (37.1 C)] 97.3 F (36.3 C) (08/04 0423) Pulse Rate:  [66-77] 66 (08/04 0423) Resp:  [17-19] 17 (08/04 0423) BP: (140-145)/(89-90) 140/90 (08/04 0423) SpO2:  [95 %-97 %] 97 % (08/04 0423) Last BM Date: 03/22/20  Intake/Output from previous day: 08/03 0701 - 08/04 0700 In: 860 [P.O.:860] Out: 850 [Urine:850] Intake/Output this shift: No intake/output data recorded.  PE: Gen: Alert, NAD, pleasant Pulm: Normal rate and effort Abd: Soft,mild distension, NT and without peritonitis. +BS. Midline wound with healthy granulation tissue at the base. No signs of infection. Ext: No LE edema Psych: A&Ox3  Skin: no rashes noted, warm and dry  Lab Results:  Recent Labs    03/28/20 0152 03/29/20 0207  WBC 18.6* 17.8*  HGB 13.9 12.9*  HCT 43.0 40.5  PLT 544* 555*   BMET Recent Labs    03/28/20 0152 03/29/20 0207  NA 135 137  K 3.9 3.9  CL 102 105  CO2 23 24  GLUCOSE 97 104*  BUN 16 16  CREATININE 1.19 1.29*  CALCIUM 8.6* 8.6*   PT/INR No results for input(s): LABPROT, INR in the last 72 hours. CMP     Component Value Date/Time   NA 137 03/29/2020 0207   NA 140 12/08/2019 1436   K 3.9 03/29/2020 0207   CL 105 03/29/2020 0207   CO2 24 03/29/2020 0207   GLUCOSE 104 (H) 03/29/2020 0207   BUN 16 03/29/2020 0207   BUN 18 12/08/2019 1436   CREATININE 1.29 (H) 03/29/2020 0207   CREATININE 1.31 (H) 07/16/2015 1539   CALCIUM 8.6 (L) 03/29/2020 0207   PROT 6.0 (L) 03/26/2020 0334   PROT 7.8 12/08/2019 1436   ALBUMIN 2.4 (L) 03/26/2020 0334   ALBUMIN 4.7 12/08/2019 1436   AST 22 03/26/2020 0334     ALT 22 03/26/2020 0334   ALKPHOS 48 03/26/2020 0334   BILITOT 0.6 03/26/2020 0334   BILITOT 0.4 12/08/2019 1436   GFRNONAA 56 (L) 03/29/2020 0207   GFRNONAA 64 11/07/2014 1047   GFRAA >60 03/29/2020 0207   GFRAA 74 11/07/2014 1047   Lipase     Component Value Date/Time   LIPASE 25 03/18/2020 1954       Studies/Results: No results found.  Anti-infectives: Anti-infectives (From admission, onward)   Start     Dose/Rate Route Frequency Ordered Stop   03/23/20 2000  piperacillin-tazobactam (ZOSYN) IVPB 3.375 g     Discontinue     3.375 g 12.5 mL/hr over 240 Minutes Intravenous Every 8 hours 03/23/20 1943     03/20/20 0645  cefTRIAXone (ROCEPHIN) 1 g in sodium chloride 0.9 % 100 mL IVPB        1 g 200 mL/hr over 30 Minutes Intravenous Every 24 hours 03/20/20 0641 03/22/20 1513   03/20/20 0645  metroNIDAZOLE (FLAGYL) IVPB 500 mg        500 mg 100 mL/hr over 60 Minutes Intravenous Every 8 hours 03/20/20 0641 03/22/20 2359       Assessment/Plan AKI-Cr 1.29 today, IVF at 77ml/hr Prostate cancer-radiation therapy Arthritis Gout Hypertension Sleep apnea - tested, but never got the CPAP  machine  Small Bowel Obstruction, mesenteric abscess, possiblemalignancy - s/p Diagnostic laparoscopy, exploratory laparotomy, SBR, partial omentectomy, excisional biopsy of mesenteric nodule and drainage of mesenteric abscess by Dr. Ninfa Linden on 03/24/2020 - POD #5 - Adv to FLD  - Path without signs of malignancy - BID WTD - Pulm toilet - Monitor WBC. Cont IV abx. Will discuss with MD about duration of abx.   FEN: FLD, bowel regimen, IVF CN:[id number] 7/26- 7/28. Zosyn 7/29 >> WBC 17.8, afebrile  MLY:YTKP, Lovenox  Foley - None Follow-Up - Dr. Ninfa Linden   LOS: 10 days    Jillyn Ledger , [facility] Surgery 03/29/2020, 9:09 AM Please see Amion for pager number during day hours 7:00am-4:30pm

## 2020-03-29 NOTE — Progress Notes (Signed)
Subjective:   OV: NAEOV  This AM: Patient states he feels "good." Reports having passed frequent gas, however has not had a BM yet. Denies nausea. States, "I can't do broth." Explains that it gives him indigestion and frequent belching. Tolerated gelatin without issues. Thinks he would do better on a soft diet. Has not seen the surgeons this morning. Feels his abdomen is getting softer, though endorses mild abdominal pain. Reports having walked around the floor several times. Denies any SOB, CP, bowel movement or abd pain.   Objective:  Vital signs in last 24 hours: Vitals:   03/28/20 0348 03/28/20 1523 03/28/20 2004 03/29/20 0423  BP: (!) 141/91 (!) 145/89 (!) 141/89 140/90  Pulse: 66 77 77 66  Resp: 16 19  17   Temp: 98.3 F (36.8 C) 98.3 F (36.8 C) 98.7 F (37.1 C) (!) 97.3 F (36.3 C)  TempSrc: Oral Oral  Oral  SpO2: 96% 95% 97% 97%  Weight:      Height:       Physical Exam General: Pleasant man laying in bed, conversational, awake and alert, in no acute distress HENT: NGT removed.  Pulmonary: Lungs CTAB. No increased WOB. No wheezing or rales.  CV: RRR. No murmurs or gallops. No lower extremity edema Abd: Abdominal distension R>L, w/ tympany improved from previous day. Hypoactive bowel sounds. No ttp of abdomen. Midline surgical incision is clean, fascia is closed and packed with gauze. No sign of infection GU: No foley. Urine at bedside is dark yellow. Skin: Slightly erythematous patchy rash on scalp   Assessment/Plan:  Principal Problem:   Partial small bowel obstruction w/ ileus Active Problems:   HTN (hypertension)   Adenocarcinoma of prostate (HCC)   Acute kidney injury ([place])   Abdominal mass   Microscopic hematuria  Mr. Lanzo is a 69 year old male with PMH of prostate cancer s/p radiation in 06),HTN and gout here for management for Partial SBO.Repeat CT yesterday showed worsening small bowel dilation and growth of the abdominal mass. Diagnostic  laparoscopyperformed with excision of several masses and a small portion of small bowel. Pt is stable and doing well postoperatively.  SBO with ileus 2/2 omental infarct  POD #4 from ex lap with small bowel resection. Recovering well with minimal pain postoperatively. Distended abdomen improving. Still with hypoactive bowel sounds. Having flatus but still no BM. Denies any N/V or abd pain. --Surg path shows omental mass and small bowel specimen with active inflammation and necrosis consistent with abscess. --No evidence of malignancy --BCx still no growth after 6 days --Encouraged movement in bed and walking  --Continue pulmonary toilet --Surgery following, appreciate recs --Consider repeat CT abd/pelvis  AKI Likely 2/2 extensive surgery and dehydration. Making urine appropriately.S/p bolus 1 L NS. Slight increase in sCr from 1.19 to 1.29.  --Daily BMP --Encourage increased fluid intake --Consider another bolus of NS if kidney function continues to worsen  Leukocytosis w/ left shift Reactive in nature and likely 2/2 to intestinal abscess. Still elevated WBC, but relatively stable (18.6-->17.8). Remains afebrile. --Monitor vitals --Daily CBC --Continue zosyn for 1-2 days (Day 7) --S/p Rocephin/flagyl for 3 days (7/26-7/28)  Thrombocytosis Continuous increase in platelet. Now at 555 from 453 4 days ago. Likely reactive 2/2 to patient's intra-abdominal abscess and neutrophilia.  --Check CBC with diff  HTN --Continue home amlodipine 10 mg   Gout --Holding allopurinol  Anticipated Discharge Location: Home Barriers to Discharge: Pending improvement in ileus and infection Dispo: Anticipated discharge in approximately 2-3 day(s).  Lacinda Axon, MD 03/29/2020, 6:04 AM [phone] Internal Medicine Teaching Service  After 5pm on weekdays and 1pm on weekends: On Call pager [phone]

## 2020-03-30 LAB — BASIC METABOLIC PANEL
Anion gap: 10 (ref 5–15)
BUN: 13 mg/dL (ref 8–23)
CO2: 25 mmol/L (ref 22–32)
Calcium: 8.6 mg/dL — ABNORMAL LOW (ref 8.9–10.3)
Chloride: 102 mmol/L (ref 98–111)
Creatinine, Ser: 1.25 mg/dL — ABNORMAL HIGH (ref 0.61–1.24)
GFR calc Af Amer: >60 mL/min (ref >60)
GFR calc non Af Amer: 58 mL/min — ABNORMAL LOW (ref >60)
Glucose, Bld: 98 mg/dL (ref 70–99)
Potassium: 4.1 mmol/L (ref 3.5–5.1)
Sodium: 137 mmol/L (ref 135–145)

## 2020-03-30 LAB — CBC
HCT: 40.8 % (ref 39.0–52.0)
Hemoglobin: 13.1 g/dL (ref 13.0–17.0)
MCH: 23.9 pg — ABNORMAL LOW (ref 26.0–34.0)
MCHC: 32.1 g/dL (ref 30.0–36.0)
MCV: 74.5 fL — ABNORMAL LOW (ref 80.0–100.0)
Platelets: 568 10*3/uL — ABNORMAL HIGH (ref 150–400)
RBC: 5.48 MIL/uL (ref 4.22–5.81)
RDW: 15.5 % (ref 11.5–15.5)
WBC: 13.7 10*3/uL — ABNORMAL HIGH (ref 4.0–10.5)
nRBC: 0 % (ref 0.0–0.2)

## 2020-03-30 MED ORDER — ACETAMINOPHEN 500 MG PO TABS: 1000.0000 mg | ORAL_TABLET | Freq: Three times a day (TID) | ORAL | Status: AC | PRN

## 2020-03-30 MED ORDER — ONDANSETRON HCL 4 MG PO TABS
4.0000 mg | ORAL_TABLET | Freq: Four times a day (QID) | ORAL | 0 refills | Status: DC
Start: 2020-03-30 — End: 2023-08-21

## 2020-03-30 MED ORDER — OXYCODONE HCL 5 MG PO TABS: 5.0000 mg | ORAL_TABLET | Freq: Four times a day (QID) | ORAL | 0 refills | Status: DC | PRN

## 2020-03-30 MED ORDER — DOCUSATE SODIUM 100 MG PO CAPS
100.0000 mg | ORAL_CAPSULE | Freq: Two times a day (BID) | ORAL | 2 refills | Status: AC | PRN
Start: 2020-03-30 — End: 2021-03-30

## 2020-03-30 NOTE — Care Management Important Message (Signed)
Important Message  Patient Details  Name: Jerry Chapman MRN: [id number] Date of Birth: 02/08/1951   Medicare Important Message Given:  Yes     Orbie Pyo 03/30/2020, 2:13 PM

## 2020-03-30 NOTE — Discharge Instructions (Signed)
[facility], [place] [phone]  OPEN ABDOMINAL SURGERY: POST OP INSTRUCTIONS  Always review your discharge instruction sheet given to you by the facility where your surgery was performed.  IF YOU HAVE DISABILITY OR FAMILY LEAVE FORMS, YOU MUST BRING THEM TO THE OFFICE FOR PROCESSING.  PLEASE DO NOT GIVE THEM TO YOUR DOCTOR.  You should slowly advance diet over the next week or so. Can alternate between protein shakes (premier for example) and a soft diet  1. A prescription for pain medication may be given to you upon discharge.  Take your pain medication as prescribed, if needed.  If narcotic pain medicine is not needed, then you may take acetaminophen (Tylenol) or ibuprofen (Advil) as needed. 2. Take your usually prescribed medications unless otherwise directed. 3. If you need a refill on your pain medication, please contact your pharmacy. They will contact our office to request authorization.  Prescriptions will not be filled after 5pm or on week-ends. 4. You should follow a light diet the first few days after arrival home, such as soup and crackers, pudding, etc.unless your doctor has advised otherwise. A high-fiber, low fat diet can be resumed as tolerated.   Be sure to include lots of fluids daily. Most patients will experience some swelling and bruising on the chest and neck area.  Ice packs will help.  Swelling and bruising can take several days to resolve 5. Most patients will experience some swelling and bruising in the area of the incision. Ice pack will help. Swelling and bruising can take several days to resolve..  6. It is common to experience some constipation if taking pain medication after surgery.  Increasing fluid intake and taking a stool softener will usually help or prevent this problem from occurring.  A mild laxative (Milk of Magnesia or Miralax) should be taken according to package directions if there are no bowel movements after 48 hours. 7.  You may have  steri-strips (small skin tapes) in place directly over the incision.  These strips should be left on the skin for 7-10 days.  If your surgeon used skin glue on the incision, you may shower in 24 hours.  The glue will flake off over the next 2-3 weeks.  Any sutures or staples will be removed at the office during your follow-up visit. You may find that a light gauze bandage over your incision may keep your staples from being rubbed or pulled. You may shower and replace the bandage daily. 8. ACTIVITIES:  You may resume regular (light) daily activities beginning the next day--such as daily self-care, walking, climbing stairs--gradually increasing activities as tolerated.  You may have sexual intercourse when it is comfortable.  Refrain from any heavy lifting or straining until approved by your doctor. a. You may drive when you no longer are taking prescription pain medication, you can comfortably wear a seatbelt, and you can safely maneuver your car and apply brakes b. Return to Work: ___________________________________ 76. You should see your doctor in the office for a follow-up appointment approximately two weeks after your surgery.  Make sure that you call for this appointment within a day or two after you arrive home to insure a convenient appointment time. OTHER INSTRUCTIONS:  _____________________________________________________________ _____________________________________________________________  WHEN TO CALL YOUR DOCTOR: 1. Fever over 101.0 2. Inability to urinate 3. Nausea and/or vomiting 4. Extreme swelling or bruising 5. Continued bleeding from incision. 6. Increased pain, redness, or drainage from the incision. 7. Difficulty swallowing or  breathing 8. Muscle cramping or spasms. 9. Numbness or tingling in hands or feet or around lips.  The clinic staff is available to answer your questions during regular business hours.  Please don't hesitate to call and ask to speak to one of the nurses  if you have concerns.  For further questions, please visit [email or web address]   Soft-Food Eating Plan A soft-food eating plan includes foods that are safe and easy to chew and swallow. Your health care provider or dietitian can help you find foods and flavors that fit into this plan. Follow this plan until your health care provider or dietitian says it is safe to start eating other foods and food textures. What are tips for following this plan? General guidelines   Take small bites of food, or cut food into pieces about  inch or smaller. Bite-sized pieces of food are easier to chew and swallow.  Eat moist foods. Avoid overly dry foods.  Avoid foods that: ? Are difficult to swallow, such as dry, chunky, crispy, or sticky foods. ? Are difficult to chew, such as hard, tough, or stringy foods. ? Contain nuts, seeds, or fruits.  Follow instructions from your dietitian about the types of liquids that are safe for you to swallow. You may be allowed to have: ? Thick liquids only. This includes only liquids that are thicker than honey. ? Thin and thick liquids. This includes all beverages and foods that become liquid at room temperature.  To make thick liquids: ? Purchase a commercial liquid thickening powder. These are available at grocery stores and pharmacies. ? Mix the thickener into liquids according to instructions on the label. ? Purchase ready-made thickened liquids. ? Thicken soup by pureeing, straining to remove chunks, and adding flour, potato flakes, or corn starch. ? Add commercial thickener to foods that become liquid at room temperature, such as milk shakes, yogurt, ice cream, gelatin, and sherbet.  Ask your health care provider whether you need to take a fiber supplement. Cooking  Cook meats so they stay tender and moist. Use methods like braising, stewing, or baking in liquid.  Cook vegetables and fruit until they are soft enough to be mashed with a  fork.  Peel soft, fresh fruits such as peaches, nectarines, and melons.  When making soup, make sure chunks of meat and vegetables are smaller than  inch.  Reheat leftover foods slowly so that a tough crust does not form. What foods are allowed? The items listed below may not be a complete list. Talk with your dietitian about what dietary choices are best for you. Grains Breads, muffins, pancakes, or waffles moistened with syrup, jelly, or butter. Dry cereals well-moistened with milk. Moist, cooked cereals. Well-cooked pasta and rice. Vegetables All soft-cooked vegetables. Shredded lettuce. Fruits All canned and cooked fruits. Soft, peeled fresh fruits. Strawberries. Dairy Milk. Cream. Yogurt. Cottage cheese. Soft cheese without the rind. Meats and other protein foods Tender, moist ground meat, poultry, or fish. Meat cooked in gravy or sauces. Eggs. Sweets and desserts Ice cream. Milk shakes. Sherbet. Pudding. Fats and oils Butter. Margarine. Olive, canola, sunflower, and grapeseed oil. Smooth salad dressing. Smooth cream cheese. Mayonnaise. Gravy. What foods are not allowed? The items listed bemay not be a complete list. Talk with your dietitian about what dietary choices are best for you. Grains Coarse or dry cereals, such as bran, granola, and shredded wheat. Tough or chewy crusty breads, such as [place] bread or baguettes. Breads with nuts, seeds, or fruit. Vegetables All raw  vegetables. Cooked corn. Cooked vegetables that are tough or stringy. Tough, crisp, fried potatoes and potato skins. Fruits Fresh fruits with skins or seeds, or both, such as apples, pears, and grapes. Stringy, high-pulp fruits, such as papaya, pineapple, coconut, and mango. Fruit leather and all dried fruit. Dairy Yogurt with nuts or coconut. Meats and other protein foods Hard, dry sausages. Dry meat, poultry, or fish. Meats with gristle. Fish with bones. Fried meat or fish. Lunch meat and hotdogs. Nuts  and seeds. Chunky peanut butter or other nut butters. Sweets and desserts Cakes or cookies that are very dry or chewy. Desserts with dried fruit, nuts, or coconut. Fried pastries. Very rich pastries. Fats and oils Cream cheese with fruit or nuts. Salad dressings with seeds or chunks. Summary  A soft-food eating plan includes foods that are safe and easy to swallow. Generally, the foods should be soft enough to be mashed with a fork.  Avoid foods that are dry, hard to chew, crunchy, sticky, stringy, or crispy.  Ask your health care provider whether you need to thicken your liquids and if you need to take a fiber supplement. This information is not intended to replace advice given to you by your health care provider. Make sure you discuss any questions you have with your health care provider. Document Revised: 12/03/2018 Document Reviewed: 10/15/2016 Elsevier Patient Education  [facility] WOUND CARE: - Change dressing twice daily - Supplies: sterile saline, kerlex, scissors, ABD pads, tape  1. Remove dressing and all packing carefully, moistening with sterile saline as needed to avoid packing/internal dressing sticking to the wound. 2.   Clean edges of skin around the wound with water/gauze, making sure there is no tape debris or leakage left on skin that could cause skin irritation or breakdown. 3.   Dampen and clean kerlex with sterile saline and pack wound from wound base to skin level, making sure to take note of any possible areas of wound tracking, tunneling and packing appropriately. Wound can be packed loosely. Trim kerlex to size if a whole kerlex is not required. 4.   Cover wound with a dry ABD pad and secure with tape.  5.   Write the date/time on the dry dressing/tape to better track when the last dressing change occurred. - apply any skin protectant/powder if recommended by clinician to protect skin/skin folds. - change dressing as needed if leakage occurs,  wound gets contaminated, or patient requests to shower. - You may shower daily with wound open and following the shower the wound should be dried and a clean dressing placed.  - Medical grade tape as well as packing supplies can be found at [facility] [address] on [place]. The remaining supplies can be found at your local drug store, [place] etc.

## 2020-03-30 NOTE — Progress Notes (Signed)
   Subjective:   OV: No acute events overnight.   This AM: This morning patient states he had a bowel movements overnight.  Surgery came by to see him and thinks he is ready for discharge.  He has been given instructions on how to care for his abdominal wound.  Patient states his daughter is a Marine scientist and willing to care for the wound.  Patient has been scheduled for follow-up visit with surgery.  He denies any abdominal pain, SOB, or N/V.  Objective:  Vital signs in last 24 hours: Vitals:   03/29/20 0423 03/29/20 1332 03/29/20 2110 03/30/20 0426  BP: 140/90 137/82 132/79 136/84  Pulse: 66 68 68 66  Resp: 17 18 18 16   Temp: (!) 97.3 F (36.3 C) 98.8 F (37.1 C) 98.4 F (36.9 C) 98.2 F (36.8 C)  TempSrc: Oral Oral Oral Oral  SpO2: 97% 98% 99% 97%  Weight:      Height:       Physical Exam General: Pleasant man laying in bed, conversational, awake and alert, in no acute distress Head: [place]/AT Pulmonary: Lungs CTAB. No increased WOB. No wheezing or rales.  CV: RRR. No murmurs or gallops. No lower extremity edema Abd: Mild abdominal distension R>L, w/ tympany improved from previous day. Hypoactive bowel sounds.No ttp of abdomen. Midline surgical incision is clean, fascia is closed and packed with gauze. No sign of infection GU: No foley. Urine at bedside is dark yellow. Skin: No obvious lesions. Slightly erythematous patchy rash on scalp   Assessment/Plan:  Principal Problem:   Partial small bowel obstruction w/ ileus Active Problems:   HTN (hypertension)   Adenocarcinoma of prostate ([place])   Acute kidney injury ([place])   Abdominal mass   Microscopic hematuria  Mr. Allum is a 69 year old male with PMH of prostate cancer s/p radiation in 06),HTN and gout here for management for Partial SBO.Repeat CT yesterday showed worsening small bowel dilation and growth of the abdominal mass. Diagnostic laparoscopyperformed with excision of several masses and a small portion of small  bowel. Pt is stable and doing well postoperatively. Ready for discharge today.   SBO with ileus 2/2 omental infarct  POD #5 from ex lap with small bowel resection. Recovering well with minimal pain postoperatively. Surg path shows omental mass and small bowel specimen with active inflammation and necrosis consistent with abscess. Distended abdomen improving. Still with hypoactive bowel sounds. Having flatus. Had first bowel movement this AM. Denies any N/V or abd pain. --No evidence of malignancy --BCx still no growth after 7 days --Mobilizing --Cleared by Surgery for discharge --Surgery outpatient follow up scheduled   AKI Likely 2/2 extensive surgery and dehydration. Making urine appropriately. S/p bolus 1 L NS. Improved  --Daily BMP --Encourage increased fluid intake  Leukocytosis w/ left shift Reactive in nature and likely 2/2 to intestinal abscess. Improving (17.8-->13.7). Remains afebrile. --Monitor vitals --Daily CBC --IV Zosyn d/c by surgery (7/29-8/5) --S/p Rocephin/flagyl for 3 days (7/26-7/28)  Thrombocytosis Continuous increase in platelet number. Now at 568 from 453 5 days ago. Likely reactive 2/2 to patient's intra-abdominal abscess and neutrophilia.  --CBC at hospital follow up  HTN --Continue home amlodipine 10 mg   Gout --Holding allopurinol  Anticipated Discharge Location: Home Barriers to Discharge: Pending improvement in ileus and infection Dispo: Anticipated discharge today  Lacinda Axon, MD 03/30/2020, 6:17 AM [phone] Internal Medicine Teaching Service  After 5pm on weekdays and 1pm on weekends: On Call pager [phone]

## 2020-03-30 NOTE — Progress Notes (Signed)
6 Days Post-Op  Subjective: CC: Doing well. No abdominal pain, n/v. Passing flatus. BM that was formed this AM. Mobilizing.   Objective: Vital signs in last 24 hours: Temp:  [98.2 F (36.8 C)-98.8 F (37.1 C)] 98.2 F (36.8 C) (08/05 0426) Pulse Rate:  [66-68] 66 (08/05 0426) Resp:  [16-18] 16 (08/05 0426) BP: (132-137)/(79-84) 136/84 (08/05 0426) SpO2:  [97 %-99 %] 97 % (08/05 0426) Last BM Date: 03/22/20  Intake/Output from previous day: 08/04 0701 - 08/05 0700 In: 3013.7 [P.O.:1240; I.V.:1285; IV Piggyback:488.7] Out: 1900 [Urine:1900] Intake/Output this shift: No intake/output data recorded.  PE: Gen: Alert, NAD, pleasant Pulm: Normal rate and effort Abd: Soft,mild distension,NT andwithout peritonitis.+BS. Midline wound with healthy granulation tissue at the base. No signs of infection. Ext: No LE edema Psych: A&Ox3  Skin: no rashes noted, warm and dry  Lab Results:  Recent Labs    03/29/20 0207 03/30/20 0433  WBC 17.8* 13.7*  HGB 12.9* 13.1  HCT 40.5 40.8  PLT 555* 568*   BMET Recent Labs    03/29/20 0207 03/30/20 0433  NA 137 137  K 3.9 4.1  CL 105 102  CO2 24 25  GLUCOSE 104* 98  BUN 16 13  CREATININE 1.29* 1.25*  CALCIUM 8.6* 8.6*   PT/INR No results for input(s): LABPROT, INR in the last 72 hours. CMP     Component Value Date/Time   NA 137 03/30/2020 0433   NA 140 12/08/2019 1436   K 4.1 03/30/2020 0433   CL 102 03/30/2020 0433   CO2 25 03/30/2020 0433   GLUCOSE 98 03/30/2020 0433   BUN 13 03/30/2020 0433   BUN 18 12/08/2019 1436   CREATININE 1.25 (H) 03/30/2020 0433   CREATININE 1.31 (H) 07/16/2015 1539   CALCIUM 8.6 (L) 03/30/2020 0433   PROT 6.0 (L) 03/26/2020 0334   PROT 7.8 12/08/2019 1436   ALBUMIN 2.4 (L) 03/26/2020 0334   ALBUMIN 4.7 12/08/2019 1436   AST 22 03/26/2020 0334   ALT 22 03/26/2020 0334   ALKPHOS 48 03/26/2020 0334   BILITOT 0.6 03/26/2020 0334   BILITOT 0.4 12/08/2019 1436   GFRNONAA 58 (L)  03/30/2020 0433   GFRNONAA 64 11/07/2014 1047   GFRAA >60 03/30/2020 0433   GFRAA 74 11/07/2014 1047   Lipase     Component Value Date/Time   LIPASE 25 03/18/2020 1954       Studies/Results: No results found.  Anti-infectives: Anti-infectives (From admission, onward)   Start     Dose/Rate Route Frequency Ordered Stop   03/23/20 2000  piperacillin-tazobactam (ZOSYN) IVPB 3.375 g     Discontinue     3.375 g 12.5 mL/hr over 240 Minutes Intravenous Every 8 hours 03/23/20 1943     03/20/20 0645  cefTRIAXone (ROCEPHIN) 1 g in sodium chloride 0.9 % 100 mL IVPB        1 g 200 mL/hr over 30 Minutes Intravenous Every 24 hours 03/20/20 0641 03/22/20 1513   03/20/20 0645  metroNIDAZOLE (FLAGYL) IVPB 500 mg        500 mg 100 mL/hr over 60 Minutes Intravenous Every 8 hours 03/20/20 0641 03/22/20 2359       Assessment/Plan AKI-Cr 1.25 today, IVF per [facility] Prostate cancer-radiation therapy Arthritis Gout Hypertension Sleep apnea - tested, but never got the CPAP machine  Small Bowel Obstruction, mesenteric abscess, possiblemalignancy - s/p Diagnostic laparoscopy, exploratory laparotomy, SBR, partial omentectomy, excisional biopsy of mesenteric nodule and drainage of mesenteric abscessby Dr. Ninfa Linden on  03/24/2020 - POD #6 -On FLD . Can adv to soft diet at home in 1-2 days  - Pathwithout signs of malignancy - BID WTD. Reports his daughter will do his dressing changes at home.  - Pulm toilet - D/c abx - Okay for d/c from a general surgery standpoint. I have sent his pain medication to his pharmacy. No abx indicated. Follow up in chart. He does not need a note for work. I will reach out to primary team to let them know.   FEN: FLD, bowel regimen, IVF RJ:WBDGREUX/BPQSOX 7/26- 7/28. Zosyn 7/29 - 8/5. UJN35.9, afebrile  AWN:OPWK, Lovenox  Foley - None Follow-Up - Dr. Ninfa Linden   LOS: 11 days    Jillyn Ledger , [facility] 03/30/2020, 9:09  AM Please see Amion for pager number during day hours 7:00am-4:30pm

## 2020-03-30 NOTE — Plan of Care (Signed)

## 2020-04-11 ENCOUNTER — Other Ambulatory Visit: Payer: Self-pay | Admitting: Surgery

## 2020-04-12 DIAGNOSIS — N179 Acute kidney failure, unspecified: Secondary | ICD-10-CM | POA: Diagnosis not present

## 2020-04-12 DIAGNOSIS — E669 Obesity, unspecified: Secondary | ICD-10-CM | POA: Diagnosis not present

## 2020-04-12 DIAGNOSIS — Z8719 Personal history of other diseases of the digestive system: Secondary | ICD-10-CM | POA: Diagnosis not present

## 2020-04-18 DIAGNOSIS — M25571 Pain in right ankle and joints of right foot: Secondary | ICD-10-CM | POA: Diagnosis not present

## 2020-05-11 DIAGNOSIS — M7582 Other shoulder lesions, left shoulder: Secondary | ICD-10-CM | POA: Diagnosis not present

## 2020-05-11 DIAGNOSIS — M25512 Pain in left shoulder: Secondary | ICD-10-CM | POA: Diagnosis not present

## 2020-05-11 DIAGNOSIS — R319 Hematuria, unspecified: Secondary | ICD-10-CM | POA: Diagnosis not present

## 2020-05-11 DIAGNOSIS — I10 Essential (primary) hypertension: Secondary | ICD-10-CM | POA: Diagnosis not present

## 2020-05-11 DIAGNOSIS — Z79899 Other long term (current) drug therapy: Secondary | ICD-10-CM | POA: Diagnosis not present

## 2020-05-19 DIAGNOSIS — I10 Essential (primary) hypertension: Secondary | ICD-10-CM | POA: Diagnosis not present

## 2020-05-19 DIAGNOSIS — R319 Hematuria, unspecified: Secondary | ICD-10-CM | POA: Diagnosis not present

## 2020-07-11 ENCOUNTER — Telehealth: Payer: Self-pay | Admitting: Family Medicine

## 2020-07-11 ENCOUNTER — Other Ambulatory Visit: Payer: Self-pay | Admitting: *Deleted

## 2020-07-11 DIAGNOSIS — I10 Essential (primary) hypertension: Secondary | ICD-10-CM

## 2020-07-11 MED ORDER — AMLODIPINE BESYLATE 10 MG PO TABS: ORAL_TABLET | ORAL | 0 refills | Status: AC

## 2020-07-11 NOTE — Telephone Encounter (Signed)
I called pt to schedule a TOC from [place] to [place]. He is needing this appointment made because he got a curtesy refill on his amLODipine (NORVASC) 10 MG tablet [[id number]] from [place]. He told me he wanted to talk to [place] first.

## 2023-08-21 ENCOUNTER — Ambulatory Visit (HOSPITAL_COMMUNITY)
Admission: EM | Admit: 2023-08-21 | Discharge: 2023-08-21 | Disposition: A | Discharge status: Home / Self Care | Payer: Medicare (Managed Care) | Attending: Family Medicine | Admitting: Family Medicine

## 2023-08-21 ENCOUNTER — Other Ambulatory Visit: Payer: Self-pay

## 2023-08-21 ENCOUNTER — Encounter (HOSPITAL_COMMUNITY): Payer: Self-pay | Admitting: *Deleted

## 2023-08-21 DIAGNOSIS — L309 Dermatitis, unspecified: Secondary | ICD-10-CM

## 2023-08-21 MED ORDER — PREDNISONE 20 MG PO TABS: 40.0000 mg | ORAL_TABLET | Freq: Every day | ORAL | 0 refills | Status: AC

## 2023-08-21 MED ORDER — FEXOFENADINE HCL 180 MG PO TABS: 180.0000 mg | ORAL_TABLET | Freq: Every day | ORAL | 0 refills | Status: AC

## 2023-08-21 NOTE — Discharge Instructions (Signed)
Take prednisone 20 mg--2 daily for 5 days  Fexofenadine/Allegra 180 mg--1 daily as needed for itching and allergic rash.  Please follow-up with your primary care if this problem continues despite the treatments provided here

## 2023-08-21 NOTE — ED Triage Notes (Signed)
Pt reports he has had a rash for 2 months. The rash covers his whole body.

## 2023-08-21 NOTE — ED Provider Notes (Signed)
[facility]    CSN: [id number] Arrival date & time: 08/21/23  1037      History   Chief Complaint Chief Complaint  Patient presents with   Rash    HPI Jerry Chapman is a 72 y.o. male.    Rash Here for a pruritic rash that has been bothering him for 2 months.  Mainly on his upper and mid back bilaterally.  It is now started to bother him some on his arms.  No fever with this and no pain for the rashes  He states he has seen his primary care and they have made a referral for dermatology.  No interventions or treatments have been attempted by the primary care by the patient's report   NKDA  He has prediabetes Past Medical History:  Diagnosis Date   Arthritis, gouty    as of 02-07-2016   At risk for sleep apnea    STOP-BANG= 5      SENT TO PCP 02-07-2016   Bilateral hydrocele    Cancer (HCC) over 10 years ago    prostate cancer    Frequency of urination    History of prostate cancer UROLOGIST--  DR Vernie Ammons--  last PSA in 2014 (0.79)   dx 2004--  T1c, Gleason 3+3, PSA 10.5  --  completed IM radiation therapy 04/ 2006   Hypertension    Right rotator cuff tear    per pt will be scheduling surgery this summer    Patient Active Problem List   Diagnosis Date Noted   Partial small bowel obstruction w/ ileus    Acute kidney injury (HCC) 03/19/2020   Abdominal mass 03/19/2020   Microscopic hematuria 03/19/2020   Injury of left leg 01/16/2017   Closed fracture of bone of left foot 01/16/2017   OSA (obstructive sleep apnea) 08/05/2016   Oxygen desaturation during REM sleep 08/05/2016   Morbid obesity (HCC) 08/05/2016   Dermatitis 06/11/2016   Class 1 obesity due to excess calories without serious comorbidity with body mass index (BMI) of 32.0 to 32.9 in adult 06/11/2016   Morbid obesity due to excess calories (HCC) 03/20/2016   Snoring 03/20/2016   Sleep apnea 03/20/2016   Hypersomnia with sleep apnea 03/20/2016   Hematuria 02/26/2016   Adenocarcinoma  of prostate (HCC) 07/16/2015   Acquired phimosis of penis 07/05/2013   HTN (hypertension) 05/09/2013   Gout 05/09/2013   Renal insufficiency 05/09/2013    Past Surgical History:  Procedure Laterality Date   BOWEL RESECTION N/A 03/24/2020   Procedure: SMALL BOWEL RESECTION;  Surgeon: Abigail Miyamoto, MD;  Location: [facility] OR;  Service: General;  Laterality: N/A;   CIRCUMCISION N/A 07/05/2013   Procedure: CIRCUMCISION ADULT, aspiration of right hydrocele, meatal dilitation;  Surgeon: Garnett Farm, MD;  Location: [facility];  Service: Urology;  Laterality: N/A;   COLONOSCOPY  last 07/19/2013   CYSTOSCOPY  02/12/2016   Procedure: CYSTOSCOPY;  Surgeon: Ihor Gully, MD;  Location: [facility] [place];  Service: Urology;;   HYDROCELE EXCISION Bilateral 02/12/2016   Procedure: HYDROCELECTOMY ADULT;  Surgeon: Ihor Gully, MD;  Location[facility];  Service: Urology;  Laterality: Bilateral;   IRRIGATION AND DEBRIDEMENT ABSCESS N/A 03/24/2020   Procedure: DRAINAGE OF MESENTERIC ABSCESS;  Surgeon: Abigail Miyamoto, MD;  Location: [facility] OR;  Service: General;  Laterality: N/A;   LAPAROSCOPY N/A 03/24/2020   Procedure: LAPAROSCOPY DIAGNOSTIC;  Surgeon: Abigail Miyamoto, MD;  Location: [facility] OR;  Service: General;  Laterality: N/A;  LAPAROTOMY N/A 03/24/2020   Procedure: EXPLORATORY LAPAROTOMY;  Surgeon: Abigail Miyamoto, MD;  Location: [facility] OR;  Service: General;  Laterality: N/A;   OMENTECTOMY N/A 03/24/2020   Procedure: PARTIAL OMENTECTOMY;  Surgeon: Abigail Miyamoto, MD;  Location: [facility] OR;  Service: General;  Laterality: N/A;   POLYPECTOMY     SKIN BIOPSY N/A 03/24/2020   Procedure: BIOPSY OF MESENTERIC NODULE;  Surgeon: Abigail Miyamoto, MD;  Location: [facility] OR;  Service: General;  Laterality: N/A;       Home Medications    Prior to Admission medications   Medication Sig Start Date End Date Taking? Authorizing Provider  allopurinol (ZYLOPRIM) 300 MG tablet Take  1 tablet (300 mg total) by mouth daily. 07/05/19  Yes Lezlie Lye, Irma M, MD  amLODipine (NORVASC) 10 MG tablet TAKE 1 TABLET(10 MG) BY MOUTH DAILY 07/11/20  Yes Sagardia, Eilleen Kempf, MD  Colchicine (MITIGARE) 0.6 MG CAPS Take 1 capsule by mouth daily. Dispense as Mitigare. 11/15/19  Yes Collie Siad A, MD  fexofenadine (ALLEGRA) 180 MG tablet Take 1 tablet (180 mg total) by mouth daily. 08/21/23  Yes Zenia Resides, MD  predniSONE (DELTASONE) 20 MG tablet Take 2 tablets (40 mg total) by mouth daily with breakfast for 5 days. 08/21/23 08/26/23 Yes Zenia Resides, MD  acetaminophen (TYLENOL) 500 MG tablet Take 2 tablets (1,000 mg total) by mouth every 8 (eight) hours as needed for mild pain. 03/30/20   Maczis, Elmer Sow, PA-C    Family History Family History  Problem Relation Age of Onset   Colon cancer Neg Hx    Pancreatic cancer Neg Hx    Stomach cancer Neg Hx    Esophageal cancer Neg Hx    Colon polyps Neg Hx    Rectal cancer Neg Hx     Social History Social History   Tobacco Use   Smoking status: Never   Smokeless tobacco: Never  Vaping Use   Vaping status: Never Used  Substance Use Topics   Alcohol use: Yes    Alcohol/week: 2.0 standard drinks of alcohol    Types: 2 Standard drinks or equivalent per week   Drug use: No     Allergies   Patient has no known allergies.   Review of Systems Review of Systems  Skin:  Positive for rash.     Physical Exam Triage Vital Signs ED Triage Vitals  Encounter Vitals Group     BP 08/21/23 1054 (!) 155/94     Systolic BP Percentile --      Diastolic BP Percentile --      Pulse Rate 08/21/23 1054 72     Resp 08/21/23 1054 20     Temp 08/21/23 1054 98.1 F (36.7 C)     Temp src --      SpO2 08/21/23 1054 97 %     Weight --      Height --      Head Circumference --      Peak Flow --      Pain Score 08/21/23 1052 0     Pain Loc --      Pain Education --      Exclude from Growth Chart --    No data  found.  Updated Vital Signs BP (!) 155/94   Pulse 72   Temp 98.1 F (36.7 C)   Resp 20   SpO2 97%   Visual Acuity Right Eye Distance:   Left Eye Distance:   Bilateral Distance:  Right Eye Near:   Left Eye Near:    Bilateral Near:     Physical Exam Vitals reviewed.  Constitutional:      General: He is not in acute distress.    Appearance: He is not ill-appearing, toxic-appearing or diaphoretic.  Eyes:     Extraocular Movements: Extraocular movements intact.     Conjunctiva/sclera: Conjunctivae normal.     Pupils: Pupils are equal, round, and reactive to light.  Cardiovascular:     Rate and Rhythm: Normal rate and regular rhythm.     Heart sounds: No murmur heard. Pulmonary:     Effort: Pulmonary effort is normal. No respiratory distress.     Breath sounds: Normal breath sounds. No stridor. No wheezing, rhonchi or rales.  Musculoskeletal:     Cervical back: Neck supple.  Lymphadenopathy:     Cervical: No cervical adenopathy.  Skin:    Coloration: Skin is not jaundiced or pale.     Comments: There is a scattered patchy rash, and in some places it is more scattered maculopapular rash.  In some places it is more confluent erythema.  It is mainly on his thoracic area, but is also noted on his upper arms bilaterally.  There is no secondary infection    Neurological:     General: No focal deficit present.     Mental Status: He is alert and oriented to person, place, and time.  Psychiatric:        Behavior: Behavior normal.      UC Treatments / Results  Labs (all labs ordered are listed, but only abnormal results are displayed) Labs Reviewed - No data to display  EKG   Radiology No results found.  Procedures Procedures (including critical care time)  Medications Ordered in UC Medications - No data to display  Initial Impression / Assessment and Plan / UC Course  I have reviewed the triage vital signs and the nursing notes.  Pertinent labs & imaging  results that were available during my care of the patient were reviewed by me and considered in my medical decision making (see chart for details).     Prednisone burst for 5 days is sent in as is Allegra for itching.  I have asked him to follow-up with his primary care about this issue.  He states the dermatology office where he has been referred will not consider scheduling him until sometime in January Final Clinical Impressions(s) / UC Diagnoses   Final diagnoses:  Dermatitis     Discharge Instructions      Take prednisone 20 mg--2 daily for 5 days  Fexofenadine/Allegra 180 mg--1 daily as needed for itching and allergic rash.  Please follow-up with your primary care if this problem continues despite the treatments provided here     ED Prescriptions     Medication Sig Dispense Auth. Provider   predniSONE (DELTASONE) 20 MG tablet Take 2 tablets (40 mg total) by mouth daily with breakfast for 5 days. 10 tablet Zenia Resides, MD   fexofenadine (ALLEGRA) 180 MG tablet Take 1 tablet (180 mg total) by mouth daily. 30 tablet Eliyah Mcshea, Janace Aris, MD      PDMP not reviewed this encounter.   Zenia Resides, MD 08/21/23 [phone]

## 2024-02-25 ENCOUNTER — Encounter (HOSPITAL_COMMUNITY): Payer: Self-pay

## 2024-02-25 ENCOUNTER — Ambulatory Visit (HOSPITAL_COMMUNITY)
Admission: EM | Admit: 2024-02-25 | Discharge: 2024-02-25 | Disposition: A | Discharge status: Home / Self Care | Payer: Medicare (Managed Care) | Attending: Family Medicine | Admitting: Family Medicine

## 2024-02-25 DIAGNOSIS — L301 Dyshidrosis [pompholyx]: Secondary | ICD-10-CM

## 2024-02-25 MED ORDER — DEXAMETHASONE SODIUM PHOSPHATE 10 MG/ML IJ SOLN
INTRAMUSCULAR | Status: AC
  Filled 2024-02-25: qty 1

## 2024-02-25 MED ORDER — DEXAMETHASONE SODIUM PHOSPHATE 10 MG/ML IJ SOLN
10.0000 mg | Freq: Once | INTRAMUSCULAR | Status: AC
  Administered 2024-02-25: 10 mg via INTRAMUSCULAR

## 2024-02-25 NOTE — Discharge Instructions (Signed)
 Meds ordered this encounter  Medications   dexamethasone (DECADRON) injection 10 mg

## 2024-02-25 NOTE — ED Triage Notes (Signed)
 Patient states he has had blisters to both hands x 2 weeks.  Patient states he has been using OTC anti-itch cream.

## 2024-02-26 NOTE — ED Provider Notes (Signed)
 [facility]   [id number] 02/25/24 Arrival Time: 1719  ASSESSMENT & PLAN:  1. Dyshidrotic eczema    No signs of infection. Trial of: Meds ordered this encounter  Medications   dexamethasone  (DECADRON ) injection 10 mg   Continue topical Lidex ointment BID.  Reviewed expectations re: course of current medical issues. Questions answered. Outlined signs and symptoms indicating need for more acute intervention. Patient verbalized understanding. After Visit Summary given.   SUBJECTIVE:  Jerry Chapman is a 73 y.o. male who presents with a skin complaint. Patient states he has had blisters to both hands x 2 weeks.  Patient states he has been using OTC anti-itch cream. Using Lidex ointment. Unknown trigger.  OBJECTIVE: Vitals:   02/25/24 1746  BP: (!) 144/83  Pulse: 63  Resp: 16  Temp: 98.7 F (37.1 C)  TempSrc: Oral  SpO2: 97%    General appearance: alert; no distress Extremities: no edema; moves all extremities normally Skin: warm and dry; blistering areas on lateral lands consistent with dyshidrotic eczema; no signs of infection Psychological: alert and cooperative; normal mood and affect  No Known Allergies  Past Medical History:  Diagnosis Date   Arthritis, gouty    as of 02-07-2016   At risk for sleep apnea    STOP-BANG= 5      SENT TO PCP 02-07-2016   Bilateral hydrocele    Cancer (HCC) over 10 years ago    prostate cancer    Frequency of urination    History of prostate cancer UROLOGIST--  DR CEIL--  last PSA in 2014 (0.79)   dx 2004--  T1c, Gleason 3+3, PSA 10.5  --  completed IM radiation therapy 04/ 2006   Hypertension    Right rotator cuff tear    per pt will be scheduling surgery this summer   Social History   Socioeconomic History   Marital status: Divorced    Spouse name: Not on file   Number of children: Not on file   Years of education: Not on file   Highest education level: Not on file  Occupational History    Occupation: Contractor  Tobacco Use   Smoking status: Never   Smokeless tobacco: Never  Vaping Use   Vaping status: Never Used  Substance and Sexual Activity   Alcohol use: Yes    Alcohol/week: 2.0 standard drinks of alcohol    Types: 2 Standard drinks or equivalent per week   Drug use: No   Sexual activity: Not on file  Other Topics Concern   Not on file  Social History Narrative   Works as a Surveyor, minerals on homes and businesses.    Lives at home byself   Social Drivers of Health   Financial Resource Strain: Not on file  Food Insecurity: Not on file  Transportation Needs: Not on file  Physical Activity: Not on file  Stress: Not on file  Social Connections: Not on file  Intimate Partner Violence: Not on file   Family History  Problem Relation Age of Onset   Colon cancer Neg Hx    Pancreatic cancer Neg Hx    Stomach cancer Neg Hx    Esophageal cancer Neg Hx    Colon polyps Neg Hx    Rectal cancer Neg Hx    Past Surgical History:  Procedure Laterality Date   BOWEL RESECTION N/A 03/24/2020   Procedure: SMALL BOWEL RESECTION;  Surgeon: Vernetta Berg, MD;  Location: [facility];  Service: General;  Laterality: N/A;   CIRCUMCISION  N/A 07/05/2013   Procedure: CIRCUMCISION ADULT, aspiration of right hydrocele, meatal dilitation;  Surgeon: Oneil JAYSON Rafter, MD;  Location: [facility];  Service: Urology;  Laterality: N/A;   COLONOSCOPY  last 07/19/2013   CYSTOSCOPY  02/12/2016   Procedure: CYSTOSCOPY;  Surgeon: Mark Ottelin, MD;  Location: [facility];  Service: Urology;;   HYDROCELE EXCISION Bilateral 02/12/2016   Procedure: HYDROCELECTOMY ADULT;  Surgeon: Mark Ottelin, MD;  Location: [facility];  Service: Urology;  Laterality: Bilateral;   IRRIGATION AND DEBRIDEMENT ABSCESS N/A 03/24/2020   Procedure: DRAINAGE OF MESENTERIC ABSCESS;  Surgeon: Vernetta Berg, MD;  Location: [facility] OR;  Service: General;  Laterality: N/A;   LAPAROSCOPY N/A  03/24/2020   Procedure: LAPAROSCOPY DIAGNOSTIC;  Surgeon: Vernetta Berg, MD;  Location: [facility] OR;  Service: General;  Laterality: N/A;   LAPAROTOMY N/A 03/24/2020   Procedure: EXPLORATORY LAPAROTOMY;  Surgeon: Vernetta Berg, MD;  Location[facility] OR;  Service: General;  Laterality: N/A;   OMENTECTOMY N/A 03/24/2020   Procedure: PARTIAL OMENTECTOMY;  Surgeon: Vernetta Berg, MD;  Location: [facility] OR;  Service: General;  Laterality: N/A;   POLYPECTOMY     SKIN BIOPSY N/A 03/24/2020   Procedure: BIOPSY OF MESENTERIC NODULE;  Surgeon: Vernetta Berg, MD;  Location: [facility] OR;  Service: General;  Laterality: Jerry Rolinda Rogue, MD 02/26/24 1232
